# Patient Record
Sex: Female | Born: 1935 | Race: White | Hispanic: No | Marital: Married | Smoking: Former smoker
Health system: Southern US, Community
[De-identification: ages and names within clinical notes are randomized; demographics above are authoritative.]

## PROBLEM LIST (undated history)

## (undated) DIAGNOSIS — E785 Hyperlipidemia, unspecified: Secondary | ICD-10-CM

## (undated) DIAGNOSIS — J449 Chronic obstructive pulmonary disease, unspecified: Secondary | ICD-10-CM

## (undated) DIAGNOSIS — T82190A Other mechanical complication of cardiac electrode, initial encounter: Secondary | ICD-10-CM

## (undated) DIAGNOSIS — M1990 Unspecified osteoarthritis, unspecified site: Secondary | ICD-10-CM

## (undated) DIAGNOSIS — K469 Unspecified abdominal hernia without obstruction or gangrene: Secondary | ICD-10-CM

## (undated) DIAGNOSIS — J45909 Unspecified asthma, uncomplicated: Secondary | ICD-10-CM

## (undated) DIAGNOSIS — I10 Essential (primary) hypertension: Secondary | ICD-10-CM

## (undated) DIAGNOSIS — E039 Hypothyroidism, unspecified: Secondary | ICD-10-CM

## (undated) DIAGNOSIS — Z45018 Encounter for adjustment and management of other part of cardiac pacemaker: Secondary | ICD-10-CM

## (undated) DIAGNOSIS — I428 Other cardiomyopathies: Secondary | ICD-10-CM

## (undated) DIAGNOSIS — I509 Heart failure, unspecified: Secondary | ICD-10-CM

## (undated) HISTORY — DX: Other cardiomyopathies: I42.8

## (undated) HISTORY — DX: Unspecified abdominal hernia without obstruction or gangrene: K46.9

## (undated) HISTORY — PX: APPENDECTOMY: SHX54

## (undated) HISTORY — DX: Hyperlipidemia, unspecified: E78.5

## (undated) HISTORY — PX: CARDIAC DEFIBRILLATOR PLACEMENT: SHX171

## (undated) HISTORY — DX: Heart failure, unspecified: I50.9

## (undated) HISTORY — DX: Unspecified osteoarthritis, unspecified site: M19.90

## (undated) HISTORY — DX: Other mechanical complication of cardiac electrode, initial encounter: T82.190A

## (undated) HISTORY — DX: Chronic obstructive pulmonary disease, unspecified: J44.9

## (undated) HISTORY — DX: Unspecified asthma, uncomplicated: J45.909

## (undated) HISTORY — PX: POLYPECTOMY: SHX149

## (undated) HISTORY — PX: OTHER SURGICAL HISTORY: SHX169

## (undated) HISTORY — DX: Encounter for adjustment and management of other part of cardiac pacemaker: Z45.018

## (undated) HISTORY — PX: CYSTECTOMY: SUR359

## (undated) HISTORY — DX: Essential (primary) hypertension: I10

## (undated) HISTORY — PX: POSTERIOR FUSION CLIVUS-C2 TRANSARTICULAR STEALTH GUIDED W/ ICBG: SUR622

## (undated) HISTORY — DX: Hypothyroidism, unspecified: E03.9

---

## 1998-03-27 ENCOUNTER — Ambulatory Visit (HOSPITAL_COMMUNITY): Admission: RE | Admit: 1998-03-27 | Discharge: 1998-03-27 | Payer: Self-pay | Admitting: Internal Medicine

## 1998-04-10 ENCOUNTER — Ambulatory Visit (HOSPITAL_COMMUNITY): Admission: RE | Admit: 1998-04-10 | Discharge: 1998-04-10 | Payer: Self-pay | Admitting: Internal Medicine

## 1998-04-10 ENCOUNTER — Encounter: Payer: Self-pay | Admitting: Internal Medicine

## 1998-08-04 ENCOUNTER — Ambulatory Visit (HOSPITAL_COMMUNITY): Admission: RE | Admit: 1998-08-04 | Discharge: 1998-08-04 | Payer: Self-pay | Admitting: Urology

## 1999-05-03 ENCOUNTER — Encounter: Payer: Self-pay | Admitting: Internal Medicine

## 1999-05-03 ENCOUNTER — Ambulatory Visit (HOSPITAL_COMMUNITY): Admission: RE | Admit: 1999-05-03 | Discharge: 1999-05-03 | Payer: Self-pay | Admitting: Internal Medicine

## 2000-05-19 ENCOUNTER — Encounter: Payer: Self-pay | Admitting: Internal Medicine

## 2000-05-19 ENCOUNTER — Ambulatory Visit (HOSPITAL_COMMUNITY): Admission: RE | Admit: 2000-05-19 | Discharge: 2000-05-19 | Payer: Self-pay | Admitting: Internal Medicine

## 2000-06-04 ENCOUNTER — Encounter: Admission: RE | Admit: 2000-06-04 | Discharge: 2000-06-04 | Payer: Self-pay | Admitting: Surgery

## 2000-06-04 ENCOUNTER — Encounter: Payer: Self-pay | Admitting: Surgery

## 2000-07-01 ENCOUNTER — Encounter (INDEPENDENT_AMBULATORY_CARE_PROVIDER_SITE_OTHER): Payer: Self-pay

## 2000-07-01 ENCOUNTER — Observation Stay (HOSPITAL_COMMUNITY): Admission: RE | Admit: 2000-07-01 | Discharge: 2000-07-02 | Payer: Self-pay | Admitting: Surgery

## 2001-05-28 ENCOUNTER — Ambulatory Visit (HOSPITAL_COMMUNITY): Admission: RE | Admit: 2001-05-28 | Discharge: 2001-05-28 | Payer: Self-pay | Admitting: Internal Medicine

## 2001-05-28 ENCOUNTER — Encounter: Payer: Self-pay | Admitting: Internal Medicine

## 2002-05-31 ENCOUNTER — Encounter: Payer: Self-pay | Admitting: Internal Medicine

## 2002-05-31 ENCOUNTER — Ambulatory Visit (HOSPITAL_COMMUNITY): Admission: RE | Admit: 2002-05-31 | Discharge: 2002-05-31 | Payer: Self-pay | Admitting: Internal Medicine

## 2003-01-31 ENCOUNTER — Encounter: Payer: Self-pay | Admitting: Internal Medicine

## 2003-01-31 ENCOUNTER — Ambulatory Visit (HOSPITAL_COMMUNITY): Admission: RE | Admit: 2003-01-31 | Discharge: 2003-01-31 | Payer: Self-pay | Admitting: Internal Medicine

## 2003-02-16 ENCOUNTER — Ambulatory Visit (HOSPITAL_COMMUNITY): Admission: RE | Admit: 2003-02-16 | Discharge: 2003-02-16 | Payer: Self-pay | Admitting: Specialist

## 2003-03-04 ENCOUNTER — Emergency Department (HOSPITAL_COMMUNITY): Admission: EM | Admit: 2003-03-04 | Discharge: 2003-03-04 | Payer: Self-pay | Admitting: Emergency Medicine

## 2003-04-26 ENCOUNTER — Encounter: Payer: Self-pay | Admitting: Gastroenterology

## 2003-04-26 ENCOUNTER — Encounter: Admission: RE | Admit: 2003-04-26 | Discharge: 2003-04-26 | Payer: Self-pay | Admitting: Gastroenterology

## 2003-09-26 ENCOUNTER — Inpatient Hospital Stay (HOSPITAL_COMMUNITY): Admission: EM | Admit: 2003-09-26 | Discharge: 2003-09-30 | Payer: Self-pay | Admitting: *Deleted

## 2004-03-21 ENCOUNTER — Inpatient Hospital Stay (HOSPITAL_BASED_OUTPATIENT_CLINIC_OR_DEPARTMENT_OTHER): Admission: RE | Admit: 2004-03-21 | Discharge: 2004-03-21 | Payer: Self-pay | Admitting: Cardiology

## 2004-06-21 ENCOUNTER — Ambulatory Visit: Payer: Self-pay | Admitting: Cardiology

## 2004-06-27 ENCOUNTER — Ambulatory Visit: Payer: Self-pay | Admitting: Pulmonary Disease

## 2004-07-20 ENCOUNTER — Ambulatory Visit: Payer: Self-pay | Admitting: Cardiology

## 2004-08-15 ENCOUNTER — Ambulatory Visit (HOSPITAL_COMMUNITY): Admission: RE | Admit: 2004-08-15 | Discharge: 2004-08-15 | Payer: Self-pay | Admitting: General Surgery

## 2004-08-15 ENCOUNTER — Ambulatory Visit (HOSPITAL_BASED_OUTPATIENT_CLINIC_OR_DEPARTMENT_OTHER): Admission: RE | Admit: 2004-08-15 | Discharge: 2004-08-15 | Payer: Self-pay | Admitting: General Surgery

## 2004-08-15 ENCOUNTER — Encounter (INDEPENDENT_AMBULATORY_CARE_PROVIDER_SITE_OTHER): Payer: Self-pay | Admitting: Specialist

## 2004-09-07 ENCOUNTER — Ambulatory Visit: Payer: Self-pay | Admitting: Internal Medicine

## 2004-09-18 ENCOUNTER — Ambulatory Visit (HOSPITAL_COMMUNITY): Admission: RE | Admit: 2004-09-18 | Discharge: 2004-09-18 | Payer: Self-pay | Admitting: Internal Medicine

## 2004-09-21 ENCOUNTER — Ambulatory Visit: Payer: Self-pay | Admitting: Internal Medicine

## 2004-09-26 ENCOUNTER — Ambulatory Visit: Payer: Self-pay | Admitting: Internal Medicine

## 2004-09-27 ENCOUNTER — Ambulatory Visit: Payer: Self-pay | Admitting: Pulmonary Disease

## 2004-10-08 ENCOUNTER — Ambulatory Visit (HOSPITAL_COMMUNITY): Admission: RE | Admit: 2004-10-08 | Discharge: 2004-10-08 | Payer: Self-pay | Admitting: Internal Medicine

## 2004-10-12 ENCOUNTER — Ambulatory Visit: Payer: Self-pay

## 2004-10-18 ENCOUNTER — Ambulatory Visit: Payer: Self-pay | Admitting: Internal Medicine

## 2004-11-08 ENCOUNTER — Ambulatory Visit: Payer: Self-pay | Admitting: Internal Medicine

## 2004-11-12 ENCOUNTER — Ambulatory Visit: Payer: Self-pay | Admitting: Internal Medicine

## 2004-11-16 ENCOUNTER — Inpatient Hospital Stay (HOSPITAL_COMMUNITY): Admission: RE | Admit: 2004-11-16 | Discharge: 2004-11-17 | Payer: Self-pay | Admitting: Internal Medicine

## 2004-11-16 ENCOUNTER — Ambulatory Visit: Payer: Self-pay | Admitting: Internal Medicine

## 2004-11-23 ENCOUNTER — Ambulatory Visit: Payer: Self-pay | Admitting: Internal Medicine

## 2004-11-26 ENCOUNTER — Ambulatory Visit: Payer: Self-pay | Admitting: Internal Medicine

## 2004-12-03 ENCOUNTER — Ambulatory Visit: Payer: Self-pay | Admitting: Internal Medicine

## 2004-12-26 ENCOUNTER — Ambulatory Visit: Payer: Self-pay | Admitting: Pulmonary Disease

## 2005-01-09 ENCOUNTER — Ambulatory Visit: Payer: Self-pay | Admitting: Internal Medicine

## 2005-02-15 ENCOUNTER — Ambulatory Visit: Payer: Self-pay | Admitting: Internal Medicine

## 2005-02-18 ENCOUNTER — Ambulatory Visit: Payer: Self-pay | Admitting: Pulmonary Disease

## 2005-02-26 ENCOUNTER — Ambulatory Visit: Payer: Self-pay

## 2005-02-26 ENCOUNTER — Ambulatory Visit: Payer: Self-pay | Admitting: Cardiology

## 2005-03-28 ENCOUNTER — Ambulatory Visit: Payer: Self-pay | Admitting: Internal Medicine

## 2005-04-01 ENCOUNTER — Ambulatory Visit: Payer: Self-pay | Admitting: Internal Medicine

## 2005-04-02 ENCOUNTER — Encounter (INDEPENDENT_AMBULATORY_CARE_PROVIDER_SITE_OTHER): Payer: Self-pay | Admitting: *Deleted

## 2005-04-02 ENCOUNTER — Inpatient Hospital Stay (HOSPITAL_COMMUNITY): Admission: RE | Admit: 2005-04-02 | Discharge: 2005-04-03 | Payer: Self-pay | Admitting: Urology

## 2005-04-08 ENCOUNTER — Ambulatory Visit: Payer: Self-pay | Admitting: Pulmonary Disease

## 2005-05-06 ENCOUNTER — Ambulatory Visit: Payer: Self-pay | Admitting: Internal Medicine

## 2005-05-15 ENCOUNTER — Ambulatory Visit: Payer: Self-pay | Admitting: Internal Medicine

## 2005-05-15 ENCOUNTER — Ambulatory Visit (HOSPITAL_COMMUNITY): Admission: RE | Admit: 2005-05-15 | Discharge: 2005-05-15 | Payer: Self-pay | Admitting: Internal Medicine

## 2005-05-29 ENCOUNTER — Ambulatory Visit: Payer: Self-pay

## 2005-08-06 ENCOUNTER — Ambulatory Visit: Payer: Self-pay | Admitting: Internal Medicine

## 2005-10-01 ENCOUNTER — Ambulatory Visit: Payer: Self-pay | Admitting: Internal Medicine

## 2005-10-11 ENCOUNTER — Ambulatory Visit: Payer: Self-pay | Admitting: Internal Medicine

## 2005-10-18 ENCOUNTER — Ambulatory Visit: Payer: Self-pay | Admitting: Internal Medicine

## 2005-10-30 ENCOUNTER — Ambulatory Visit: Payer: Self-pay | Admitting: Cardiology

## 2005-10-30 ENCOUNTER — Ambulatory Visit: Payer: Self-pay | Admitting: Internal Medicine

## 2005-11-05 ENCOUNTER — Ambulatory Visit (HOSPITAL_COMMUNITY): Admission: RE | Admit: 2005-11-05 | Discharge: 2005-11-05 | Payer: Self-pay | Admitting: Internal Medicine

## 2005-12-05 ENCOUNTER — Ambulatory Visit: Payer: Self-pay | Admitting: Internal Medicine

## 2005-12-11 ENCOUNTER — Ambulatory Visit: Payer: Self-pay | Admitting: Internal Medicine

## 2005-12-31 ENCOUNTER — Ambulatory Visit: Payer: Self-pay | Admitting: Internal Medicine

## 2006-03-24 ENCOUNTER — Ambulatory Visit: Payer: Self-pay | Admitting: Internal Medicine

## 2006-04-16 ENCOUNTER — Ambulatory Visit: Payer: Self-pay | Admitting: Internal Medicine

## 2006-04-25 ENCOUNTER — Encounter: Payer: Self-pay | Admitting: Cardiology

## 2006-04-25 ENCOUNTER — Ambulatory Visit: Payer: Self-pay

## 2006-06-20 ENCOUNTER — Ambulatory Visit: Payer: Self-pay

## 2006-06-24 ENCOUNTER — Ambulatory Visit: Payer: Self-pay | Admitting: *Deleted

## 2006-06-24 ENCOUNTER — Ambulatory Visit: Payer: Self-pay | Admitting: Internal Medicine

## 2006-07-01 ENCOUNTER — Ambulatory Visit: Payer: Self-pay

## 2006-07-01 ENCOUNTER — Encounter: Payer: Self-pay | Admitting: Internal Medicine

## 2006-08-05 ENCOUNTER — Ambulatory Visit: Payer: Self-pay | Admitting: Internal Medicine

## 2006-09-23 ENCOUNTER — Ambulatory Visit: Payer: Self-pay | Admitting: Internal Medicine

## 2006-10-28 ENCOUNTER — Ambulatory Visit: Payer: Self-pay | Admitting: Cardiology

## 2006-11-24 ENCOUNTER — Ambulatory Visit (HOSPITAL_COMMUNITY): Admission: RE | Admit: 2006-11-24 | Discharge: 2006-11-24 | Payer: Self-pay | Admitting: Internal Medicine

## 2006-12-17 ENCOUNTER — Ambulatory Visit: Payer: Self-pay | Admitting: Internal Medicine

## 2006-12-23 ENCOUNTER — Ambulatory Visit: Payer: Self-pay | Admitting: Internal Medicine

## 2007-01-23 ENCOUNTER — Ambulatory Visit: Payer: Self-pay | Admitting: Cardiology

## 2007-01-23 LAB — CONVERTED CEMR LAB
BUN: 13 mg/dL (ref 6–23)
CO2: 26 meq/L (ref 19–32)
Calcium: 9.6 mg/dL (ref 8.4–10.5)
Chloride: 103 meq/L (ref 96–112)
Creatinine, Ser: 1.4 mg/dL — ABNORMAL HIGH (ref 0.4–1.2)
Pro B Natriuretic peptide (BNP): 71 pg/mL (ref 0.0–100.0)
TSH: 2.11 microintl units/mL (ref 0.35–5.50)

## 2007-01-27 ENCOUNTER — Ambulatory Visit: Admission: RE | Admit: 2007-01-27 | Discharge: 2007-01-27 | Payer: Self-pay | Admitting: Cardiology

## 2007-02-10 ENCOUNTER — Ambulatory Visit: Payer: Self-pay | Admitting: Internal Medicine

## 2007-02-17 ENCOUNTER — Ambulatory Visit: Payer: Self-pay | Admitting: Cardiology

## 2007-03-18 ENCOUNTER — Ambulatory Visit: Payer: Self-pay | Admitting: Emergency Medicine

## 2007-04-13 ENCOUNTER — Ambulatory Visit (HOSPITAL_BASED_OUTPATIENT_CLINIC_OR_DEPARTMENT_OTHER): Admission: RE | Admit: 2007-04-13 | Discharge: 2007-04-13 | Payer: Self-pay | Admitting: Emergency Medicine

## 2007-04-17 ENCOUNTER — Ambulatory Visit: Payer: Self-pay | Admitting: Internal Medicine

## 2007-04-27 ENCOUNTER — Ambulatory Visit: Payer: Self-pay | Admitting: Emergency Medicine

## 2007-04-29 ENCOUNTER — Ambulatory Visit: Payer: Self-pay | Admitting: Pulmonary Disease

## 2007-05-05 ENCOUNTER — Ambulatory Visit: Payer: Self-pay | Admitting: Internal Medicine

## 2007-05-13 ENCOUNTER — Ambulatory Visit: Payer: Self-pay

## 2007-05-22 ENCOUNTER — Ambulatory Visit (HOSPITAL_BASED_OUTPATIENT_CLINIC_OR_DEPARTMENT_OTHER): Admission: RE | Admit: 2007-05-22 | Discharge: 2007-05-22 | Payer: Self-pay | Admitting: Emergency Medicine

## 2007-05-27 ENCOUNTER — Ambulatory Visit: Payer: Self-pay | Admitting: Pulmonary Disease

## 2007-05-27 ENCOUNTER — Ambulatory Visit: Payer: Self-pay | Admitting: Emergency Medicine

## 2007-06-03 DIAGNOSIS — J449 Chronic obstructive pulmonary disease, unspecified: Secondary | ICD-10-CM

## 2007-06-03 DIAGNOSIS — J4489 Other specified chronic obstructive pulmonary disease: Secondary | ICD-10-CM

## 2007-06-03 DIAGNOSIS — I2789 Other specified pulmonary heart diseases: Secondary | ICD-10-CM

## 2007-06-03 DIAGNOSIS — I509 Heart failure, unspecified: Secondary | ICD-10-CM

## 2007-06-03 DIAGNOSIS — I10 Essential (primary) hypertension: Secondary | ICD-10-CM

## 2007-06-03 DIAGNOSIS — E039 Hypothyroidism, unspecified: Secondary | ICD-10-CM | POA: Insufficient documentation

## 2007-06-03 DIAGNOSIS — I5022 Chronic systolic (congestive) heart failure: Secondary | ICD-10-CM

## 2007-06-03 HISTORY — DX: Chronic obstructive pulmonary disease, unspecified: J44.9

## 2007-06-03 HISTORY — DX: Other specified chronic obstructive pulmonary disease: J44.89

## 2007-06-03 HISTORY — DX: Heart failure, unspecified: I50.9

## 2007-06-03 HISTORY — DX: Hypothyroidism, unspecified: E03.9

## 2007-06-03 HISTORY — DX: Essential (primary) hypertension: I10

## 2007-06-23 ENCOUNTER — Ambulatory Visit: Payer: Self-pay | Admitting: Internal Medicine

## 2007-06-29 ENCOUNTER — Ambulatory Visit: Payer: Self-pay | Admitting: Internal Medicine

## 2007-06-29 LAB — CONVERTED CEMR LAB
BUN: 13 mg/dL (ref 6–23)
Calcium: 9.7 mg/dL (ref 8.4–10.5)
Chloride: 103 meq/L (ref 96–112)
Creatinine, Ser: 1.3 mg/dL — ABNORMAL HIGH (ref 0.4–1.2)
GFR calc non Af Amer: 42 mL/min
Sodium: 141 meq/L (ref 135–145)

## 2007-07-15 ENCOUNTER — Ambulatory Visit: Payer: Self-pay | Admitting: Emergency Medicine

## 2007-08-12 ENCOUNTER — Ambulatory Visit: Payer: Self-pay | Admitting: Internal Medicine

## 2007-09-14 ENCOUNTER — Ambulatory Visit: Payer: Self-pay | Admitting: Emergency Medicine

## 2007-10-26 ENCOUNTER — Ambulatory Visit: Payer: Self-pay | Admitting: Emergency Medicine

## 2007-11-10 ENCOUNTER — Ambulatory Visit: Payer: Self-pay | Admitting: Internal Medicine

## 2007-11-20 ENCOUNTER — Ambulatory Visit: Payer: Self-pay | Admitting: *Deleted

## 2007-12-11 ENCOUNTER — Encounter: Payer: Self-pay | Admitting: Emergency Medicine

## 2008-01-19 ENCOUNTER — Ambulatory Visit: Payer: Self-pay | Admitting: Internal Medicine

## 2008-01-25 ENCOUNTER — Telehealth: Payer: Self-pay | Admitting: Emergency Medicine

## 2008-03-03 ENCOUNTER — Ambulatory Visit (HOSPITAL_COMMUNITY): Admission: RE | Admit: 2008-03-03 | Discharge: 2008-03-03 | Payer: Self-pay | Admitting: Internal Medicine

## 2008-04-06 ENCOUNTER — Ambulatory Visit: Payer: Self-pay | Admitting: Emergency Medicine

## 2008-04-20 ENCOUNTER — Ambulatory Visit: Payer: Self-pay | Admitting: Internal Medicine

## 2008-04-24 ENCOUNTER — Ambulatory Visit: Payer: Self-pay | Admitting: Cardiology

## 2008-04-24 ENCOUNTER — Inpatient Hospital Stay (HOSPITAL_COMMUNITY): Admission: EM | Admit: 2008-04-24 | Discharge: 2008-05-04 | Payer: Self-pay | Admitting: Emergency Medicine

## 2008-04-28 ENCOUNTER — Encounter (INDEPENDENT_AMBULATORY_CARE_PROVIDER_SITE_OTHER): Payer: Self-pay | Admitting: Gastroenterology

## 2008-07-20 ENCOUNTER — Ambulatory Visit: Payer: Self-pay | Admitting: Internal Medicine

## 2008-09-21 ENCOUNTER — Encounter: Payer: Self-pay | Admitting: Internal Medicine

## 2008-10-18 ENCOUNTER — Ambulatory Visit: Payer: Self-pay | Admitting: Internal Medicine

## 2008-12-05 ENCOUNTER — Telehealth (INDEPENDENT_AMBULATORY_CARE_PROVIDER_SITE_OTHER): Payer: Self-pay | Admitting: *Deleted

## 2008-12-28 ENCOUNTER — Telehealth (INDEPENDENT_AMBULATORY_CARE_PROVIDER_SITE_OTHER): Payer: Self-pay | Admitting: *Deleted

## 2009-01-11 ENCOUNTER — Ambulatory Visit: Payer: Self-pay | Admitting: Emergency Medicine

## 2009-01-24 ENCOUNTER — Ambulatory Visit: Payer: Self-pay | Admitting: Internal Medicine

## 2009-02-02 ENCOUNTER — Ambulatory Visit: Payer: Self-pay

## 2009-02-02 ENCOUNTER — Encounter: Payer: Self-pay | Admitting: Internal Medicine

## 2009-02-10 ENCOUNTER — Encounter: Payer: Self-pay | Admitting: Internal Medicine

## 2009-02-14 ENCOUNTER — Ambulatory Visit: Payer: Self-pay | Admitting: Emergency Medicine

## 2009-02-22 ENCOUNTER — Telehealth (INDEPENDENT_AMBULATORY_CARE_PROVIDER_SITE_OTHER): Payer: Self-pay | Admitting: *Deleted

## 2009-03-01 ENCOUNTER — Encounter: Admission: RE | Admit: 2009-03-01 | Discharge: 2009-03-01 | Payer: Self-pay | Admitting: Orthopaedic Surgery

## 2009-04-25 ENCOUNTER — Ambulatory Visit: Payer: Self-pay | Admitting: Internal Medicine

## 2009-05-05 ENCOUNTER — Telehealth (INDEPENDENT_AMBULATORY_CARE_PROVIDER_SITE_OTHER): Payer: Self-pay | Admitting: *Deleted

## 2009-05-11 ENCOUNTER — Encounter: Payer: Self-pay | Admitting: Internal Medicine

## 2009-05-24 ENCOUNTER — Encounter (INDEPENDENT_AMBULATORY_CARE_PROVIDER_SITE_OTHER): Payer: Self-pay | Admitting: *Deleted

## 2009-06-12 ENCOUNTER — Ambulatory Visit: Payer: Self-pay | Admitting: Emergency Medicine

## 2009-07-25 ENCOUNTER — Encounter: Payer: Self-pay | Admitting: Internal Medicine

## 2009-07-31 ENCOUNTER — Telehealth: Payer: Self-pay | Admitting: Emergency Medicine

## 2009-07-31 ENCOUNTER — Ambulatory Visit: Payer: Self-pay | Admitting: Internal Medicine

## 2009-08-03 ENCOUNTER — Encounter: Payer: Self-pay | Admitting: Internal Medicine

## 2009-08-09 ENCOUNTER — Encounter: Payer: Self-pay | Admitting: Emergency Medicine

## 2009-08-09 ENCOUNTER — Ambulatory Visit (HOSPITAL_COMMUNITY): Admission: RE | Admit: 2009-08-09 | Discharge: 2009-08-09 | Payer: Self-pay | Admitting: Emergency Medicine

## 2009-09-05 ENCOUNTER — Ambulatory Visit: Payer: Self-pay | Admitting: Cardiology

## 2009-09-05 ENCOUNTER — Inpatient Hospital Stay (HOSPITAL_COMMUNITY): Admission: EM | Admit: 2009-09-05 | Discharge: 2009-09-15 | Payer: Self-pay | Admitting: Emergency Medicine

## 2009-09-06 ENCOUNTER — Encounter (INDEPENDENT_AMBULATORY_CARE_PROVIDER_SITE_OTHER): Payer: Self-pay | Admitting: Internal Medicine

## 2009-10-05 ENCOUNTER — Encounter: Payer: Self-pay | Admitting: Cardiology

## 2009-10-24 ENCOUNTER — Ambulatory Visit: Payer: Self-pay | Admitting: Internal Medicine

## 2009-10-31 ENCOUNTER — Encounter: Payer: Self-pay | Admitting: Internal Medicine

## 2009-11-08 ENCOUNTER — Ambulatory Visit: Payer: Self-pay | Admitting: Emergency Medicine

## 2009-11-17 ENCOUNTER — Encounter: Payer: Self-pay | Admitting: Internal Medicine

## 2009-11-29 ENCOUNTER — Encounter: Admission: RE | Admit: 2009-11-29 | Discharge: 2009-11-29 | Payer: Self-pay | Admitting: General Surgery

## 2009-12-04 ENCOUNTER — Ambulatory Visit (HOSPITAL_COMMUNITY): Admission: RE | Admit: 2009-12-04 | Discharge: 2009-12-04 | Payer: Self-pay | Admitting: Internal Medicine

## 2009-12-06 ENCOUNTER — Encounter: Admission: RE | Admit: 2009-12-06 | Discharge: 2009-12-06 | Payer: Self-pay | Admitting: Internal Medicine

## 2009-12-11 ENCOUNTER — Ambulatory Visit: Payer: Self-pay | Admitting: Internal Medicine

## 2009-12-11 ENCOUNTER — Encounter: Payer: Self-pay | Admitting: Internal Medicine

## 2009-12-11 DIAGNOSIS — I429 Cardiomyopathy, unspecified: Secondary | ICD-10-CM | POA: Insufficient documentation

## 2009-12-13 ENCOUNTER — Encounter: Payer: Self-pay | Admitting: Cardiology

## 2009-12-13 ENCOUNTER — Encounter: Payer: Self-pay | Admitting: Internal Medicine

## 2009-12-15 ENCOUNTER — Telehealth: Payer: Self-pay | Admitting: Internal Medicine

## 2010-01-10 ENCOUNTER — Encounter (INDEPENDENT_AMBULATORY_CARE_PROVIDER_SITE_OTHER): Payer: Self-pay | Admitting: General Surgery

## 2010-01-10 ENCOUNTER — Inpatient Hospital Stay (HOSPITAL_COMMUNITY): Admission: RE | Admit: 2010-01-10 | Discharge: 2010-01-25 | Payer: Self-pay | Admitting: General Surgery

## 2010-01-10 ENCOUNTER — Ambulatory Visit: Payer: Self-pay | Admitting: Internal Medicine

## 2010-01-19 ENCOUNTER — Ambulatory Visit: Payer: Self-pay | Admitting: Physical Medicine & Rehabilitation

## 2010-02-17 ENCOUNTER — Inpatient Hospital Stay (HOSPITAL_COMMUNITY): Admission: EM | Admit: 2010-02-17 | Discharge: 2010-02-22 | Payer: Self-pay | Admitting: Emergency Medicine

## 2010-03-01 ENCOUNTER — Telehealth: Payer: Self-pay | Admitting: Internal Medicine

## 2010-03-08 ENCOUNTER — Ambulatory Visit: Payer: Self-pay | Admitting: Emergency Medicine

## 2010-03-14 ENCOUNTER — Ambulatory Visit: Payer: Self-pay | Admitting: Internal Medicine

## 2010-03-14 ENCOUNTER — Encounter: Payer: Self-pay | Admitting: Internal Medicine

## 2010-03-15 ENCOUNTER — Ambulatory Visit: Payer: Self-pay | Admitting: Internal Medicine

## 2010-03-19 ENCOUNTER — Telehealth: Payer: Self-pay | Admitting: Internal Medicine

## 2010-03-19 LAB — CONVERTED CEMR LAB
CO2: 24 meq/L (ref 19–32)
Calcium: 8.8 mg/dL (ref 8.4–10.5)
Glucose, Bld: 256 mg/dL — ABNORMAL HIGH (ref 70–99)
Potassium: 3.7 meq/L (ref 3.5–5.1)
Sodium: 136 meq/L (ref 135–145)

## 2010-04-05 ENCOUNTER — Encounter: Payer: Self-pay | Admitting: Internal Medicine

## 2010-04-12 ENCOUNTER — Ambulatory Visit: Payer: Self-pay | Admitting: Internal Medicine

## 2010-04-26 ENCOUNTER — Encounter: Payer: Self-pay | Admitting: Internal Medicine

## 2010-05-02 ENCOUNTER — Encounter: Payer: Self-pay | Admitting: Internal Medicine

## 2010-05-14 ENCOUNTER — Ambulatory Visit: Payer: Self-pay | Admitting: Emergency Medicine

## 2010-05-17 ENCOUNTER — Ambulatory Visit: Payer: Self-pay | Admitting: Internal Medicine

## 2010-06-14 ENCOUNTER — Ambulatory Visit: Payer: Self-pay | Admitting: Internal Medicine

## 2010-07-04 ENCOUNTER — Encounter: Payer: Self-pay | Admitting: Internal Medicine

## 2010-07-16 ENCOUNTER — Encounter: Payer: Self-pay | Admitting: Internal Medicine

## 2010-07-18 ENCOUNTER — Ambulatory Visit: Payer: Self-pay | Admitting: Internal Medicine

## 2010-07-20 ENCOUNTER — Telehealth: Payer: Self-pay | Admitting: Internal Medicine

## 2010-07-23 ENCOUNTER — Telehealth: Payer: Self-pay | Admitting: Internal Medicine

## 2010-07-30 ENCOUNTER — Ambulatory Visit (HOSPITAL_COMMUNITY)
Admission: RE | Admit: 2010-07-30 | Discharge: 2010-07-30 | Payer: Self-pay | Source: Home / Self Care | Attending: Internal Medicine | Admitting: Internal Medicine

## 2010-07-30 ENCOUNTER — Encounter: Payer: Self-pay | Admitting: Internal Medicine

## 2010-07-31 ENCOUNTER — Encounter: Payer: Self-pay | Admitting: Internal Medicine

## 2010-07-31 ENCOUNTER — Ambulatory Visit: Payer: Self-pay | Admitting: Internal Medicine

## 2010-08-09 ENCOUNTER — Ambulatory Visit: Payer: Self-pay

## 2010-08-09 ENCOUNTER — Ambulatory Visit (HOSPITAL_COMMUNITY)
Admission: RE | Admit: 2010-08-09 | Discharge: 2010-08-09 | Payer: Self-pay | Source: Home / Self Care | Attending: Internal Medicine | Admitting: Internal Medicine

## 2010-08-09 ENCOUNTER — Encounter: Payer: Self-pay | Admitting: Internal Medicine

## 2010-08-21 ENCOUNTER — Encounter: Payer: Self-pay | Admitting: Internal Medicine

## 2010-08-27 ENCOUNTER — Encounter: Payer: Self-pay | Admitting: Internal Medicine

## 2010-08-28 ENCOUNTER — Encounter: Payer: Self-pay | Admitting: Emergency Medicine

## 2010-08-28 ENCOUNTER — Encounter: Payer: Self-pay | Admitting: Internal Medicine

## 2010-08-28 ENCOUNTER — Emergency Department (HOSPITAL_COMMUNITY)
Admission: EM | Admit: 2010-08-28 | Discharge: 2010-08-28 | Payer: Self-pay | Source: Home / Self Care | Admitting: Emergency Medicine

## 2010-08-28 ENCOUNTER — Telehealth: Payer: Self-pay | Admitting: Internal Medicine

## 2010-09-03 ENCOUNTER — Encounter: Payer: Self-pay | Admitting: Internal Medicine

## 2010-09-03 ENCOUNTER — Ambulatory Visit (HOSPITAL_COMMUNITY)
Admission: RE | Admit: 2010-09-03 | Discharge: 2010-09-03 | Payer: Self-pay | Source: Home / Self Care | Attending: Internal Medicine | Admitting: Internal Medicine

## 2010-09-03 LAB — DIFFERENTIAL
Basophils Absolute: 0 10*3/uL (ref 0.0–0.1)
Basophils Relative: 0 % (ref 0–1)
Eosinophils Absolute: 0.2 10*3/uL (ref 0.0–0.7)
Eosinophils Relative: 2 % (ref 0–5)
Lymphocytes Relative: 26 % (ref 12–46)
Lymphs Abs: 2.5 10*3/uL (ref 0.7–4.0)
Monocytes Absolute: 1.1 10*3/uL — ABNORMAL HIGH (ref 0.1–1.0)
Monocytes Relative: 11 % (ref 3–12)
Neutro Abs: 5.8 10*3/uL (ref 1.7–7.7)
Neutrophils Relative %: 60 % (ref 43–77)

## 2010-09-03 LAB — CBC
HCT: 36.9 % (ref 36.0–46.0)
Hemoglobin: 12 g/dL (ref 12.0–15.0)
MCH: 28.5 pg (ref 26.0–34.0)
MCHC: 32.5 g/dL (ref 30.0–36.0)
MCV: 87.6 fL (ref 78.0–100.0)
Platelets: 270 10*3/uL (ref 150–400)
RBC: 4.21 MIL/uL (ref 3.87–5.11)
RDW: 14.4 % (ref 11.5–15.5)
WBC: 9.6 10*3/uL (ref 4.0–10.5)

## 2010-09-03 LAB — POCT CARDIAC MARKERS
CKMB, poc: 3.4 ng/mL (ref 1.0–8.0)
CKMB, poc: 3.6 ng/mL (ref 1.0–8.0)
Myoglobin, poc: 149 ng/mL (ref 12–200)
Myoglobin, poc: 128 ng/mL (ref 12–200)
Troponin i, poc: <0.05 ng/mL (ref 0.00–0.09)
Troponin i, poc: <0.05 ng/mL (ref 0.00–0.09)

## 2010-09-03 LAB — COMPREHENSIVE METABOLIC PANEL
ALT: 12 U/L (ref 0–35)
AST: 14 U/L (ref 0–37)
Albumin: 3.7 g/dL (ref 3.5–5.2)
Alkaline Phosphatase: 63 U/L (ref 39–117)
BUN: 14 mg/dL (ref 6–23)
CO2: 26 mEq/L (ref 19–32)
Calcium: 9.8 mg/dL (ref 8.4–10.5)
Chloride: 97 mEq/L (ref 96–112)
Creatinine, Ser: 1.31 mg/dL — ABNORMAL HIGH (ref 0.4–1.2)
GFR calc Af Amer: 47 mL/min — ABNORMAL LOW (ref >60)
GFR calc non Af Amer: 39 mL/min — ABNORMAL LOW (ref >60)
Glucose, Bld: 100 mg/dL — ABNORMAL HIGH (ref 70–99)
Potassium: 3.8 mEq/L (ref 3.5–5.1)
Sodium: 132 mEq/L — ABNORMAL LOW (ref 135–145)
Total Bilirubin: 0.4 mg/dL (ref 0.3–1.2)
Total Protein: 7 g/dL (ref 6.0–8.3)

## 2010-09-03 LAB — PROTIME-INR
INR: 1.02 (ref 0.00–1.49)
Prothrombin Time: 13.6 seconds (ref 11.6–15.2)

## 2010-09-03 LAB — URINE CULTURE
Colony Count: NO GROWTH
Culture  Setup Time: 201201102231
Culture: NO GROWTH

## 2010-09-03 LAB — APTT: aPTT: 48 seconds — ABNORMAL HIGH (ref 24–37)

## 2010-09-03 LAB — URINALYSIS, ROUTINE W REFLEX MICROSCOPIC
Bilirubin Urine: NEGATIVE
Hgb urine dipstick: NEGATIVE
Ketones, ur: NEGATIVE mg/dL
Nitrite: NEGATIVE
Protein, ur: NEGATIVE mg/dL
Specific Gravity, Urine: 1.016 (ref 1.005–1.030)
Urine Glucose, Fasting: NEGATIVE mg/dL
Urobilinogen, UA: 0.2 mg/dL (ref 0.0–1.0)
pH: 5.5 (ref 5.0–8.0)

## 2010-09-05 ENCOUNTER — Encounter: Payer: Self-pay | Admitting: Internal Medicine

## 2010-09-05 LAB — GLUCOSE, CAPILLARY
Glucose-Capillary: 119 mg/dL — ABNORMAL HIGH (ref 70–99)
Glucose-Capillary: 92 mg/dL (ref 70–99)

## 2010-09-05 LAB — SURGICAL PCR SCREEN
MRSA, PCR: NEGATIVE
Staphylococcus aureus: NEGATIVE

## 2010-09-06 NOTE — Op Note (Signed)
  NAMEWENDI, LASTRA NO.:  [id number]  MEDICAL RECORD NO.:  [id number]          PATIENT TYPE:  OIB  LOCATION:  2899                         FACILITY:  [facility]  PHYSICIAN:  Duke Salvia, MD, FACCDATE OF BIRTH:  Mar 25, 1930  DATE OF PROCEDURE: DATE OF DISCHARGE:  09/03/2010                              OPERATIVE REPORT   PREOPERATIVE DIAGNOSES:  Previously implanted cardiac resynchronization therapy defibrillator now at elective replacement indicator with intercurrent normalization of left ventricular function; postoperative device migration and subcutaneous atrophy.  POSTOPERATIVE DIAGNOSES:  Previously implanted cardiac resynchronization therapy defibrillator now at elective replacement indicator with intercurrent normalization of left ventricular function; postoperative device migration and subcutaneous atrophy.  PROCEDURE:  Explantation of a previously implanted ICD insertion of a CRT pacemaker with pocket revision moving the pocket medially.  Following obtaining informed consent, the patient was brought to the Electrophysiology Laboratory and placed on the fluoroscopic table in supine position.  After routine prep and drape, an incision was made about 1-2 cm below the prior incision where there was also significant amount of subcutaneous atrophy.  The device pocket was opened.  A cheesy material was noted.  This was cultured.  The pocket was revised medially to because of the inferolateral migration of the defibrillator pocket. Interrogation of the previously implanted LV 4194 lead, serial number [id number] demonstrated an amplitude of 9 with a pace impedance of 889, threshold 0.8 at 0.5.  The previously implanted 6949 lead, serial number [id number] demonstrated an amplitude of 12.4 with pace impedance of 740, threshold 0.7 at 0.5 and the previously implanted atrial lead [id number] demonstrated an amplitude of 5.4 with the pace impedance of 446 and a  threshold of 1 volt at 0.5.  With these acceptable parameters recorded, the system was reconnected to a Medtronic Concerto CRT-P device, Model [id number], serial number [id number].  The left ventricular - 4194 lead was placed in the RV port.  The 6949 rate sense portion was placed in the LV port.  The high-voltage leads were capped.  The leads and the pulse generator were placed in the pocket and secured to the prepectoral fascia medially.  The pocket was copiously irrigated with antibiotic containing saline solution.  Surgicel was placed on the medial aspect.  The cephalad aspect of the pocket was then closed in two layers in normal fashion. The wound was washed, dried, and a benzoin and Steri-Strip dressing was applied.  Needle counts, sponge counts, and instrument counts were correct at the end of the procedure according to the staff.  The patient tolerated the procedure without apparent complication.     Duke Salvia, MD, [facility]     SCK/MEDQ  D:  09/03/2010  T:  09/04/2010  Job:  [id number]  Electronically Signed by Sherryl Manges MD [facility] on 09/06/2010 01:42:13 PM

## 2010-09-08 ENCOUNTER — Encounter: Payer: Self-pay | Admitting: Internal Medicine

## 2010-09-10 ENCOUNTER — Encounter: Payer: Self-pay | Admitting: Emergency Medicine

## 2010-09-10 LAB — WOUND CULTURE
Culture: NO GROWTH
Gram Stain: NONE SEEN

## 2010-09-17 ENCOUNTER — Ambulatory Visit: Admission: RE | Admit: 2010-09-17 | Discharge: 2010-09-17 | Payer: Self-pay | Source: Home / Self Care

## 2010-09-17 ENCOUNTER — Encounter: Payer: Self-pay | Admitting: Internal Medicine

## 2010-09-20 NOTE — Progress Notes (Signed)
Summary: surgical clearance  Phone Note From Other Clinic Call back at [phone]   Caller: Cindy/[facility] Summary of Call: needs letter stating ok for surgery fax [phone] Initial call taken by: Migdalia Dk,  December 15, 2009 2:25 PM  Follow-up for Phone Call        Spoke with Arline Asp and confirmed she has received faxed office note from pt's office visit with Dr. Graciela Husbands on December 11, 2009 where it states pt is acceptable risk for surgery.  She states she had missed this when reviewing note earlier and will call us if furthur note is needed. Follow-up by: Dossie Arbour, RN, BSN,  December 15, 2009 2:45 PM

## 2010-09-20 NOTE — Assessment & Plan Note (Signed)
Summary: ROV. APPT IS 9:30/ SURGICAL CLEARANCE FOR COLON SURGREY/ GD   Visit Type:  Pre-op Evaluation Referring Provider:  Nicholes Mango Primary Provider:  Oneta Rack  CC:  no complaints.  History of Present Illness: [name] is a 77 year old woman with a history of congestive heart failure, secondary nonischemic cardiomyopathy, previous EF was 25-30%.  She is status post BIV/ICD.  Most recent ejection fraction was 60%.  She also has a history of COPD with chronic dyspnea, hypertension, hyperlipidemia and chronic renal insufficiency.  She underwent colostomy for a colonic obstruction the mechanism of which she is not aware. There is conversation about taking down of colostomy.  She denies changes in her shortness of breath. Has been no chest pain. There is no edema. Her cough is chronic.  Current Medications (verified): 1)  Synthroid 75 Mcg  Tabs (Levothyroxine Sodium) .... Take 1 Tablet By Mouth Once A Day 2)  Prilosec 20 Mg Cpdr (Omeprazole) .... Two Times A Day 3)  Advair Diskus 250-50 Mcg/dose  Misc (Fluticasone-Salmeterol) .... Inhale 1 Puff Two Times A Day 4)  Spiriva Handihaler 18 Mcg  Caps (Tiotropium Bromide Monohydrate) .... Inhale Contents of 1 Capsule Once Daily 5)  Ventolin Hfa 108 (90 Base) Mcg/act  Aers (Albuterol Sulfate) .[name].. 1-2 Puffs Every 4-6 Hours As Needed 6)  Benadryl 25 Mg  Tabs (Diphenhydramine Hcl) .... As Needed 7)  Diovan 320 Mg Tabs (Valsartan) .[name].. 1 By Mouth Daily 8)  Amlodipine Besylate 5 Mg Tabs (Amlodipine Besylate) .... Once Daily 9)  Coreg 12.5 Mg Tabs (Carvedilol) .... Take 1 Tablet By Mouth Two Times A Day 10)  Pravastatin Sodium 40 Mg Tabs (Pravastatin Sodium) .... Take 1 Tablet By Mouth Once A Day 11)  Align  Caps (Probiotic Product) .[name].. 1 By Mouth Daily 12)  Miralax  Powd (Polyethylene Glycol 3350) .... Once Daily 13)  Fenofibrate Micronized 134 Mg Caps (Fenofibrate Micronized) .[name].. 1 By Mouth Daily  Allergies (verified): 1)  ! Lescol 2)  !  Vytorin 3)  ! Crestor 4)  ! Lopid 5)  ! Lipitor 6)  ! Ace Inhibitors 7)  ! Bisoprolol Fumarate (Bisoprolol Fumarate) 8)  ! * Tylenol With Codeine 9)  Lisinopril (Lisinopril) 10)  * Oxycotin  Vital Signs:  Patient profile:   77 year old female Height:      64 inches Weight:      126 pounds Pulse rate:   68 / minute Resp:     18 per minute BP sitting:   124 / 64  (right arm)  Vitals Entered By: Marrion Coy, CNA (December 11, 2009 2:54 PM)  Physical Exam  General:  The patient was alert and oriented in no acute distress. HEENT Normal.  Neck veins were flat, carotids were brisk.  Lungs have rhonchi and wheezing throughout right dorsum left Heart sounds were regular without murmurs or gallops.  there is significance thinning of the skin overlying her ICD. The skin was easily over the wires and the device itself Abdomen was soft with active bowel sounds. There is no clubbing cyanosis or edema. Skin Warm and dry     ICD Specifications Following MD:  Sherryl Manges, MD     ICD Vendor:  Medtronic     ICD Model Number:  7304     ICD Serial Number:  [id number] ICD DOI:  11/16/2004     ICD Implanting MD:  Sherryl Manges, MD  Lead 1:    Location: RA     DOI: 11/16/2004  Model #: [id number]     Serial #: [id number]     Status: active Lead 2:    Location: RV     DOI: 11/16/2004     Model #: [id number]     Serial #: [id number]     Status: active Lead 3:    Location: LV     DOI: 11/16/2004     Model #: [id number]     Serial #: [id number]     Status: active  Indications::  NICM, CHF   ICD Follow Up Remote Check?  No Battery Voltage:  2.69 V     Charge Time:  10.36 seconds     Underlying rhythm:  SR ICD Dependent:  No       ICD Device Measurements Atrium:  Amplitude: 3.5 mV, Impedance: 424 ohms, Threshold: 1.0 V at 0.4 msec Right Ventricle:  Amplitude: 10.6 mV, Impedance: 664 ohms, Threshold: 1.0 V at 0.4 msec Left Ventricle:  Impedance: 664 ohms, Threshold: 1.5 V at 0.4 msec Configuration:  BIPOLAR Shock Impedance: 50/61 ohms   Episodes MS Episodes:  5     Coumadin:  No Shock:  0     ATP:  0     Nonsustained:  0     Atrial Pacing:  0%     Ventricular Pacing:  100%  Brady Parameters Mode DDD     Lower Rate Limit:  50     Upper Rate Limit 130 PAV 110     Sensed AV Delay:  80  Tachy Zones VF:  200     VT:  240 FVT VIA VF     VT1:  171     Next Cardiology Appt Due:  02/16/2010 Tech Comments:  Normal device function.  No changes made today.  Pt seen for pre-op clearance for reversal of colostomy.  [phone] lead stable.  Letter and magnet given to patient today.  Pt unable to hear alert tones.  Mode switch episodes total less than 2 minutes.  No EGM's.  ROV 3 months clinic. Gypsy Balsam RN BSN  December 11, 2009 3:07 PM   Impression & Recommendations:  Problem # 1:  IMPLANTATION OF DEFIBRILLATOR, MEDTRONIC MAXIMO 730X (ICD-V45.02) she is status post CRT-D. Previous cardiogram hyper for upper respiratory fall she decide to cardiogram shows a left bundle branch block morphology. Chest x-rays were reviewed comparing January 2009 the position of both of these were similar.  As her ejection fraction is really normalized at least as of last year echo, will not press too hard at this time in the event that there is LV lead dislodgment we'll obtain a chest x-ray today to look for this.  Problem # 2:  [id number] LEAD (ICD-996.04) tlead is stable. The patient has been reminded of what to do in the event that she has shocks.  Problem # 3:  CARDIOMYOPATHY, SECONDARY (ICD-425.9) doing maintained on her current medications. She is acceptable risk for surgery Her updated medication list for this problem includes:    Diovan 320 Mg Tabs (Valsartan) .[name]... 1 by mouth daily    Amlodipine Besylate 5 Mg Tabs (Amlodipine besylate) ..... Once daily    Coreg 12.5 Mg Tabs (Carvedilol) .[name]... Take 1 tablet by mouth two times a day  Other Orders: T-2 View CXR (71020TC)  Patient Instructions: 1)  A chest x-ray  takes a picture of the organs and structures inside the chest, including the heart, lungs, and blood vessels. This test can show several things, including, whether the heart is  enlarged; whether fluid is building up in the lungs; and whether pacemaker / defibrillator leads are still in place. 2)  You are scheduled for a device check from home on  March 15, 2010. You may send your transmission at any time that day. If you have a wireless device, the transmission will be sent automatically. After your physician reviews your transmission, you will receive a postcard with your next transmission date.

## 2010-09-20 NOTE — Miscellaneous (Signed)
Summary: Device change out  Clinical Lists Changes  Observations: Added new observation of PPM INDICATN: NICM (09/05/2010 10:30) Added new observation of MAGNET RTE: BOL 85 ERI 65 (09/05/2010 10:30) Added new observation of PPMLEADSTAT3: active (09/05/2010 10:30) Added new observation of PPMLEADSER3: [id number] (09/05/2010 10:30) Added new observation of PPMLEADMOD3: 6949  (09/05/2010 10:30) Added new observation of PPMLEADDOI3: 11/16/2004  (09/05/2010 10:30) Added new observation of PPMLEADLOC3: LV  (09/05/2010 10:30) Added new observation of PPMLEADSTAT2: active  (09/05/2010 10:30) Added new observation of PPMLEADSER2: [id number]  (09/05/2010 10:30) Added new observation of PPMLEADMOD2: 4194  (09/05/2010 10:30) Added new observation of PPMLEADDOI2: 11/16/2004  (09/05/2010 10:30) Added new observation of PPMLEADLOC2: RV  (09/05/2010 10:30) Added new observation of PPMLEADSTAT1: active  (09/05/2010 10:30) Added new observation of PPMLEADSER1: [id number] V  (09/05/2010 10:30) Added new observation of PPMLEADMOD1: 5076  (09/05/2010 10:30) Added new observation of PPMLEADDOI1: 11/16/2004  (09/05/2010 10:30) Added new observation of PPMLEADLOC1: RA  (09/05/2010 10:30) Added new observation of PPM IMP MD: Sherryl Manges, MD  (09/05/2010 10:30) Added new observation of PPM DOI: 09/03/2010  (09/05/2010 10:30) Added new observation of PPM SERL#: [id number]  (09/05/2010 10:30) Added new observation of PPM MODL#: [id number]  (09/05/2010 62:95) Added new observation of PACEMAKERMFG: Medtronic  (09/05/2010 10:30) Added new observation of PACEMAKER MD: Sherryl Manges, MD  (09/05/2010 10:30) Added new observation of ICD MODL#: 7304  (09/05/2010 10:30) Added new observation of ICDEXPLCOMM: 09/03/2010  Medtronic Maximo [id number] H explanted  (09/05/2010 10:30)      PPM Specifications Following MD:  Sherryl Manges, MD     PPM Vendor:  Medtronic     PPM Model Number:  [id number]     PPM Serial Number:   [id number] PPM DOI:  09/03/2010     PPM Implanting MD:  Sherryl Manges, MD  Lead 1    Location: RA     DOI: 11/16/2004     Model #: [id number]     Serial #: [id number]     Status: active Lead 2    Location: RV     DOI: 11/16/2004     Model #: 4194     Serial #: [id number]     Status: active Lead 3    Location: LV     DOI: 11/16/2004     Model #: [id number]     Serial #: [id number]     Status: active  Magnet Response Rate:  BOL 85 ERI 65  Indications:  NICM   ICD Specifications Following MD:  Sherryl Manges, MD     ICD Vendor:  Medtronic     ICD Model Number:  7304     ICD Serial Number:  [id number] ICD DOI:  11/16/2004     ICD Implanting MD:  Sherryl Manges, MD  Lead 1:    Location: RA     DOI: 11/16/2004     Model #: 2542     Serial #: [id number]     Status: active Lead 2:    Location: RV     DOI: 11/16/2004     Model #: 3151     Serial #: [id number]     Status: active Lead 3:    Location: LV     DOI: 11/16/2004     Model #: [id number]     Serial #: [id number]     Status: active  Indications::  NICM, CHF  Explantation Comments: 09/03/2010  Medtronic Maximo [id number] H explanted  ICD Follow Up ICD Dependent:  No       ICD  Device Measurements Configuration: BIPOLAR  Episodes Coumadin:  No  Brady Parameters Mode DDD     Lower Rate Limit:  50     Upper Rate Limit 130 PAV 110     Sensed AV Delay:  80  Tachy Zones VF:  200     VT:  240 FVT VIA VF     VT1:  171

## 2010-09-20 NOTE — Assessment & Plan Note (Signed)
Summary: [name]   Referring Provider:  Nicholes Mango Primary Provider:  Oneta Rack  CC:  eph.  .  History of Present Illness: [name] is a 77 year old woman with a history of congestive heart failure, secondary nonischemic cardiomyopathy, previous EF was 25-30%.  She is status post BIV/ICD.  Most recent ejection fraction was 60%.  She also has a history of COPD with chronic dyspnea, hypertension, hyperlipidemia and chronic renal insufficiency.  [place]urns for follow-up.  She underwent colostomy for a colonic obstruction in January 2011. Underwent takedown with extensive lysis of adhesions in May.  Earlier this month admitted with colitis. No obstruction on CT.  A few weeks ago was having edema and hctz added to her Diovan. Edema has resolved. Feels like she is starting to get stronger. Still with mild chronic shortness of breath. No chest pain.   Current Medications (verified): 1)  Synthroid 75 Mcg  Tabs (Levothyroxine Sodium) .... Take 1 Tablet By Mouth Once A Day 2)  Prilosec 20 Mg Cpdr (Omeprazole) .... Two Times A Day 3)  Spiriva Handihaler 18 Mcg  Caps (Tiotropium Bromide Monohydrate) .... Inhale Contents of 1 Capsule Once Daily 4)  Ventolin Hfa 108 (90 Base) Mcg/act  Aers (Albuterol Sulfate) .[name].. 1-2 Puffs Every 4-6 Hours As Needed 5)  Benadryl 25 Mg  Tabs (Diphenhydramine Hcl) .... As Needed 6)  Diovan Hct 160-12.5 Mg Tabs (Valsartan-Hydrochlorothiazide) .... Take 1 Tablet By Mouth Once A Day 7)  Amlodipine Besylate 5 Mg Tabs (Amlodipine Besylate) .... Once Daily 8)  Coreg 12.5 Mg Tabs (Carvedilol) .... Take 1 Tablet By Mouth Two Times A Day 9)  Miralax  Powd (Polyethylene Glycol 3350) .... Once Daily 10)  Fenofibrate Micronized 134 Mg Caps (Fenofibrate Micronized) .[name].. 1 By Mouth Daily 11)  Hydrochlorothiazide 25 Mg Tabs (Hydrochlorothiazide) .[name].. 1 By Mouth Daily 12)  Amoxicillin 250 Mg Caps (Amoxicillin) .[name].. 1 By Mouth Three Times A Day For Uti For 2 Weeks 13)  Zofran Odt 8  Mg Tbdp (Ondansetron) .[name].. 1 By Mouth Daily As Needed For Nausea 14)  Fluticasone Propionate 50 Mcg/act Susp (Fluticasone Propionate) .... 2 Sprays Each Nostril Two Times A Day 15)  Diltiazem Hcl Er Beads 240 Mg Xr24h-Cap (Diltiazem Hcl Er Beads) .... Take One Capsule Once Daily  Allergies (verified): 1)  ! Lescol 2)  ! Vytorin 3)  ! Crestor 4)  ! Lopid 5)  ! Lipitor 6)  ! Ace Inhibitors 7)  ! Bisoprolol Fumarate (Bisoprolol Fumarate) 8)  ! * Tylenol With Codeine 9)  Lisinopril (Lisinopril) 10)  * Oxycotin  Past History:  Past Medical History: Last updated: 07/31/2009 Congestive heart failure due to NICM     --EF previoulsy 25%    --ECHO 5/10 60-65%    --s/p ICD--Medtronic Maximo 730x COPD with chronic dyspnea Hypertension Hypothyroidism  Review of Systems       As per HPI and past medical history; otherwise all systems negative.   Vital Signs:  Patient profile:   77 year old female Height:      64 inches Weight:      123 pounds BMI:     21.19 Pulse rate:   90 / minute BP sitting:   142 / 76  (right arm) Cuff size:   regular  Vitals Entered By: Judithe Modest CMA (March 14, 2010 4:09 PM)  Physical Exam  General:  Elderly thin woman in no acute distress.  Ambulates around the clinic slowly without any respiratory difficulty. HEENT:  Normal.  NECK:  Supple.  No JVD.  Carotids are 2+ bilaterally without any bruits. There is no lymphadenopathy or thyromegaly. LUNGS:  Clear with diminished breath sounds throughout and mild end- expiratory wheezing. CARDIAC:  PMI is nondisplaced.  She has very distant heart sounds with an S4.  No obvious murmur. ABDOMEN:  Soft, nontender, nondistended.  No hepatosplenomegaly, no bruits, no mass. EXTREMITIES:  Warm with no cyanosis, clubbing or edema.  No rash. NEURO:  Alert and oriented x3.  Cranial nerves II-XII are intact.  Moves all 4 extremities  without difficulty.  Affect is pleasant.      ICD Specifications Following MD:   Sherryl Manges, MD     ICD Vendor:  Medtronic     ICD Model Number:  7304     ICD Serial Number:  [id number] ICD DOI:  11/16/2004     ICD Implanting MD:  Sherryl Manges, MD  Lead 1:    Location: RA     DOI: 11/16/2004     Model #: 5076     Serial #: [id number]     Status: active Lead 2:    Location: RV     DOI: 11/16/2004     Model #: 7829     Serial #: [id number]     Status: active Lead 3:    Location: LV     DOI: 11/16/2004     Model #: 7846     Serial #: [id number]     Status: active  Indications::  NICM, CHF   ICD Follow Up ICD Dependent:  No       ICD Device Measurements Configuration: BIPOLAR  Episodes Coumadin:  No  Brady Parameters Mode DDD     Lower Rate Limit:  50     Upper Rate Limit 130 PAV 110     Sensed AV Delay:  80  Tachy Zones VF:  200     VT:  240 FVT VIA VF     VT1:  171     Impression & Recommendations:  Problem # 1:  CARDIOMYOPATHY, SECONDARY (ICD-425.9) Doing well. EF recovered. Volume status looks good. I am concerned over possibility of hypokalemia with recent addition of HCTZ. Check BMET.   Problem # 2:  HYPERTENSION (ICD-401.9) BP just mildly elevated. F/u with Dr. Oneta Rack. Can increase Diovan or amlodipine as needed.   Other Orders: EKG w/ Interpretation (93000)  Patient Instructions: 1)  Your physician recommends that you continue on your current medications as directed. Please refer to the Current Medication list given to you today. 2)  Your physician recommends you return 03/15/2010 for BMET-401.9

## 2010-09-20 NOTE — Letter (Signed)
Summary: Dr Perrin Maltese Office Note  Dr Angelia Mould Ingram's Office Note   Imported By: Roderic Ovens 05/17/2010 11:19:49  _____________________________________________________________________  External Attachment:    Type:   Image     Comment:   External Document

## 2010-09-20 NOTE — Cardiovascular Report (Signed)
Summary: Office Visit Remote   Office Visit Remote   Imported By: Roderic Ovens 08/02/2010 14:09:52  _____________________________________________________________________  External Attachment:    Type:   Image     Comment:   External Document

## 2010-09-20 NOTE — Cardiovascular Report (Signed)
Summary: Office Visit   Office Visit   Imported By: Roderic Ovens 12/12/2009 16:11:19  _____________________________________________________________________  External Attachment:    Type:   Image     Comment:   External Document

## 2010-09-20 NOTE — Assessment & Plan Note (Signed)
Summary: cough, COPD   Visit Type:  Follow-up Copy to:  Nicholes Mango Primary Provider/Referring Provider:  Oneta Rack  CC:  COPD. Increased sob with exertion. Cough with yellow mucus. She is using her flutter valve 2 to 3 times daily and says this helps get more mucus up.[name]  History of Present Illness: [name] is a 77 year old woman with COPD that is in the mild to moderate range.  She is followed by Dr Gala Romney for congestive heart failure secondary to nonischemic cardiomyopathy.  Previous EF was 25-30%.  She is status post BiV ICD and her most recent EF was 60%. She also has diastolic dysfunction and a history of pulmonary edema.  Has OSA but unable to tolerate CPAP.   ROV 02/14/09 -- returns for f/u. Still with exertional dyspnea. Her cough and mucous are better since we treated her for bronchitis with azithromycin. Phlegm is now light yellow, less frequent. Sometimes getting choked up on saliva?,  has some chest wall pain from cough. Uses ventolin   ROV 06/12/09 -- returns for regular follow up of her mild to mod COPD, diastolic dysfxn and pulm HTN. Breathing has been fairly stable, has some bad days especially when it is humid. Tried CPAP for her OSA but unable to tolerate (returned it to the company). Uses Ventolin 2 - 3x a week. No exacerbations or hospitalizations since last visit. Still has episodes of coughing after taking by mouth, sometimes she gets choked up on her saliva.   ROV 11/08/09 -- returns for f/u. Unfortunately since last visit she had bowel obstruction and required bowel resection. She is getting her strength back. Continues to have daily cough, moreso than prior to her hospitalization. Taking Advair + Spiriva. She gets coughing spells after she uses Advair. Also still has symptoms suggestive of aspiration. Swallow eval showed evidence for food not  clearing. She is on an ARB, no ACE-I.   ROV 03/08/10 -- returns for her COPD, systolic/diastolic CHF, OSA. Since last visit she  had reversal of her colostomy, still doesn't have her strength back. She still has cough. Has been using flutter valve, has been trying to practice swallowing precautions. She coughs whenever she takes her Advair. Seems to tolerate the Spiriva. She has had some increased LE edema, currently seems to be at baseline.   Current Medications (verified): 1)  Synthroid 75 Mcg  Tabs (Levothyroxine Sodium) .... Take 1 Tablet By Mouth Once A Day 2)  Prilosec 20 Mg Cpdr (Omeprazole) .... Two Times A Day 3)  Advair Diskus 250-50 Mcg/dose  Misc (Fluticasone-Salmeterol) .... Inhale 1 Puff Two Times A Day 4)  Spiriva Handihaler 18 Mcg  Caps (Tiotropium Bromide Monohydrate) .... Inhale Contents of 1 Capsule Once Daily 5)  Ventolin Hfa 108 (90 Base) Mcg/act  Aers (Albuterol Sulfate) .[name].. 1-2 Puffs Every 4-6 Hours As Needed 6)  Benadryl 25 Mg  Tabs (Diphenhydramine Hcl) .... As Needed 7)  Diovan Hct 160-12.5 Mg Tabs (Valsartan-Hydrochlorothiazide) .... Take 1 Tablet By Mouth Once A Day 8)  Amlodipine Besylate 5 Mg Tabs (Amlodipine Besylate) .... Once Daily 9)  Coreg 12.5 Mg Tabs (Carvedilol) .... Take 1 Tablet By Mouth Two Times A Day 10)  Miralax  Powd (Polyethylene Glycol 3350) .... Once Daily 11)  Fenofibrate Micronized 134 Mg Caps (Fenofibrate Micronized) .[name].. 1 By Mouth Daily 12)  Hydrochlorothiazide 25 Mg Tabs (Hydrochlorothiazide) .[name].. 1 By Mouth Daily 13)  Amoxicillin 250 Mg Caps (Amoxicillin) .[name].. 1 By Mouth Three Times A Day For Uti For 2 Weeks  14)  Zofran Odt 8 Mg Tbdp (Ondansetron) .[name].. 1 By Mouth Daily As Needed For Nausea  Allergies (verified): 1)  ! Lescol 2)  ! Vytorin 3)  ! Crestor 4)  ! Lopid 5)  ! Lipitor 6)  ! Ace Inhibitors 7)  ! Bisoprolol Fumarate (Bisoprolol Fumarate) 8)  ! * Tylenol With Codeine 9)  Lisinopril (Lisinopril) 10)  * Oxycotin  Vital Signs:  Patient profile:   77 year old female Height:      64 inches (162.56 cm) Weight:      125 pounds (56.82 kg) BMI:      21.53 O2 Sat:      91 % on Room air Temp:     97.7 degrees F (36.50 degrees C) oral Pulse rate:   63 / minute BP sitting:   118 / 62  (right arm) Cuff size:   regular  Vitals Entered By: Michel Bickers CMA (March 08, 2010 11:51 AM)  O2 Sat at Rest %:  91 O2 Flow:  Room air CC: COPD. Increased sob with exertion. Cough with yellow mucus. She is using her flutter valve 2 to 3 times daily and says this helps get more mucus up. Comments Medications reviewed with the patient. Daytime phone verified. Michel Bickers [facility]  March 08, 2010 11:52 AM   Physical Exam  General:  normal appearance, healthy appearing, and thin.   Head:  normocephalic and atraumatic Nose:  some mild nasal congestion, no active gtt Mouth:  no deformity or lesions Neck:  no masses, thyromegaly, or abnormal cervical nodes Chest Wall:  no deformities noted Lungs:  scattered rhonchi, wheeze on forced expiration Heart:  regular rate and rhythm, S1, S2 without murmurs, rubs, gallops, or clicks Abdomen:  not examined Extremities:  trace edema Skin:  intact without lesions or rashes Psych:  alert and cooperative; normal mood and affect; normal attention span and concentration   Impression & Recommendations:  Problem # 1:  COPD (ICD-496) - d/c advair ( ? related to cough) - contin spiriva - as needed SABA  Problem # 2:  COUGH (ICD-786.2) - stop advair - flutter two times a day - start fluticasone spray - benadryl - reassess 2 mo - swallow precautions  Medications Added to Medication List This Visit: 1)  Hydrochlorothiazide 25 Mg Tabs (Hydrochlorothiazide) .[name].. 1 by mouth daily 2)  Amoxicillin 250 Mg Caps (Amoxicillin) .[name].. 1 by mouth three times a day for uti for 2 weeks 3)  Zofran Odt 8 Mg Tbdp (Ondansetron) .[name].. 1 by mouth daily as needed for nausea 4)  Fluticasone Propionate 50 Mcg/act Susp (Fluticasone propionate) .... 2 sprays each nostril two times a day  Other Orders: Est. Patient Level IV ([address])  Patient  Instructions: 1)  Stop your Advair 2)  Continue your Spiriva once daily  3)  Continue to use your flutter valve two times a day  4)  Continue to tuck your chin with swallowing, use small bites, drink water after each bite.  5)  Continue your benadryl at bedtime  6)  Start fluticasone nasal spray, 2 sprays each nostril two times a day  7)  Follow up with Dr Delton Coombes in 2 months to review your symptoms.  Prescriptions: FLUTICASONE PROPIONATE 50 MCG/ACT SUSP (FLUTICASONE PROPIONATE) 2 sprays each nostril two times a day  #1 x 5   Entered and Authorized by:   Leslye Peer MD   Signed by:   Leslye Peer MD on 03/08/2010   Method used:  Electronically to        [facility] # [id number]* (retail)       [address]       [place], [place]  [address]       Ph: [phone] or [phone]       Fax: [phone]   RxID:   [id number]

## 2010-09-20 NOTE — Letter (Signed)
Summary: Dr Delon [place] Office Note  Dr Delon [place] Office Note   Imported By: Roderic Ovens 12/13/2009 15:54:40  _____________________________________________________________________  External Attachment:    Type:   Image     Comment:   External Document

## 2010-09-20 NOTE — Cardiovascular Report (Signed)
Summary: Office Visit   Office Visit   Imported By: Roderic Ovens 03/16/2010 10:21:39  _____________________________________________________________________  External Attachment:    Type:   Image     Comment:   External Document

## 2010-09-20 NOTE — Progress Notes (Signed)
Summary: Question about ICD  Phone Note Call from Patient Call back at Home Phone [phone]   Caller: Patient Summary of Call: Pt calling with question about ICD  Initial call taken by: Judie Grieve,  July 23, 2010 10:30 AM  Follow-up for Phone Call        pt stated that she understands that she is getting close to [facility] and sent in a transmission on the 30th.  Don't see the results for that and will follow-up with device clinic.  Follow-up by: Claris Gladden RN,  July 23, 2010 11:09 AM  Additional Follow-up for Phone Call Additional follow up Details #1::        scheduled appt for 12/13 at 2 to discuss gen change and possible 6949 revision.  Additional Follow-up by: Claris Gladden RN,  July 23, 2010 3:31 PM

## 2010-09-20 NOTE — Consult Note (Signed)
Summary: [facility]   Imported By: Roderic Ovens 09/15/2009 14:10:12  _____________________________________________________________________  External Attachment:    Type:   Image     Comment:   External Document

## 2010-09-20 NOTE — Cardiovascular Report (Signed)
Summary: Office Visit Remote   Office Visit Remote   Imported By: Roderic Ovens 11/01/2009 11:33:03  _____________________________________________________________________  External Attachment:    Type:   Image     Comment:   External Document

## 2010-09-20 NOTE — Cardiovascular Report (Signed)
Summary: Office Visit Remote   Office Visit Remote   Imported By: Roderic Ovens 07/02/2010 15:09:32  _____________________________________________________________________  External Attachment:    Type:   Image     Comment:   External Document

## 2010-09-20 NOTE — Letter (Signed)
Summary: Device-Delinquent Phone Journalist, newspaper, Main Office  [address]   [place], [place] [address]   Phone: [phone]  Fax: [phone]     July 16, 2010 MRN: [id number]   KEMARA QUIGLEY [address] [place], [place]  [address]   Dear [name],  According to our records, you were scheduled for a device phone transmission on 07-12-2010.     We did not receive any results from this check.  If you transmitted on your scheduled day, please call us to help troubleshoot your system.  If you forgot to send your transmission, please send one upon receipt of this letter.  Thank you,   Architectural technologist [facility]

## 2010-09-20 NOTE — Letter (Signed)
Summary: Remote Device Check  [facility], Main Office  [address]   [place], [place] [address]   Phone: [phone]  Fax: [phone]     May 02, 2010 MRN: [id number]   [name] [address] [place], [place]  [address]   Dear [name],   Your remote transmission was recieved and reviewed by your physician.  All diagnostics were within normal limits for you.  __X___Your next transmission is scheduled for:  05-17-10.  Please transmit at any time this day.  If you have a wireless device your transmission will be sent automatically.   Sincerely,  [name]

## 2010-09-20 NOTE — Progress Notes (Signed)
Summary: pt has edema in legs and feet  Phone Note Call from Patient Call back at Home Phone [phone]   Caller: Patient Reason for Call: Talk to Nurse, Talk to Doctor Summary of Call: pt has edema in her legs and feet and she wants to talk to you about it Initial call taken by: Omer Jack,  March 01, 2010 12:15 PM  Follow-up for Phone Call        for the past 3 days her feet and legs have been swollen, gets some better overnight, she tries to keep them elevated during the day but it doesn't seem to help, her feet are beginning to get painful and she is getting concerned, she is not on any diuretic, no unusal SOB for her, she is sch to see Dr Gala Romney on 7/27, discussed fluid and salt intake and she is aware of these issues and watches closely, will discuss w/DOD and call pt back Meredith Staggers, RN  March 01, 2010 12:44 PM   Additional Follow-up for Phone Call Additional follow up Details #1::        discussed w/Dr Graciela Husbands add hctz to pts diovan, pt is aware and states she is almost out of her diovan anyway, but she does state upon d/c from hospital on 7/6 they decreased her diovan to 320mg  1/2 tab daily, that is what d/c summary states, new rx for diovan hct 160/12.5 sent to pharmacy, will check bmet when pt comes in for appt on 7/27 Meredith Staggers, RN  March 01, 2010 4:18 PM     New/Updated Medications: DIOVAN HCT 160-12.5 MG TABS (VALSARTAN-HYDROCHLOROTHIAZIDE) Take 1 tablet by mouth once a day Prescriptions: DIOVAN HCT 160-12.5 MG TABS (VALSARTAN-HYDROCHLOROTHIAZIDE) Take 1 tablet by mouth once a day  #30 x 6   Entered by:   Meredith Staggers, RN   Authorized by:   Dolores Patty, MD, [facility]   Signed by:   Meredith Staggers, RN on 03/01/2010   Method used:   Electronically to        [id number] (retail)       [address]       [place], [place]  [address]       Ph: [phone] or [phone]       Fax: [phone]   RxID:   [id number]

## 2010-09-20 NOTE — Letter (Signed)
Summary: Implantable Device Instructions  Architectural technologist, Main Office  [address]   [place], [place] [address]   Phone: [phone]  Fax: [phone]      Implantable Device Instructions  You are scheduled for:  __x___ CRT-Defib change out to CRT-Pacemaker with 6949 lead revision   on September 03, 2010 at 1145 with Dr. Graciela Husbands.  1.  Please arrive at the Short Stay Center at [facility] at 9:45 am on the day of your procedure.  2.  Do not eat or drink after midnight the night before your procedure.  3.  Complete lab work on August 28, 2010 at a lab of your choice (prescription given).    You do not have to be fasting.  4.  Do NOT take these medications for the morning of your procedure:  Diovan and Hydrochlorothiazide.  Take all other morning medications with a few sips of water.  5.  Plan for an overnight stay.  Bring your insurance cards and a list of your medications.  6.  Wash your chest and neck with antibacterial soap (any brand) the evening before and the morning of your procedure.  Rinse well.   *If you have ANY questions after you get home, please call the office [phone].  Claris Gladden, RN  *Every attempt is made to prevent procedures from being rescheduled.  Due to the nature of Electrophysiology, rescheduling can happen.  The physician is always aware and directs the staff when this occurs.

## 2010-09-20 NOTE — Cardiovascular Report (Signed)
Summary: Office Visit Remote   Office Visit Remote   Imported By: Roderic Ovens 05/08/2010 11:55:09  _____________________________________________________________________  External Attachment:    Type:   Image     Comment:   External Document

## 2010-09-20 NOTE — Assessment & Plan Note (Signed)
Summary: COPD, OSA, cough   Visit Type:  Follow-up Copy to:  Nicholes Mango Primary Provider/Referring Provider:  Oneta Rack  CC:  COPD follow-up...the patient says her breathing has not changed since stopping Advair...cough is still present with yellow mucus.  History of Present Illness: Ms. Swetz is a 77 year old woman with COPD that is in the mild to moderate range.  She is followed by Dr Gala Romney for congestive heart failure secondary to nonischemic cardiomyopathy.  Previous EF was 25-30%.  She is status post BiV ICD and her most recent EF was 60%. She also has diastolic dysfunction and a history of pulmonary edema.  Has OSA but unable to tolerate CPAP.   ROV 06/12/09 -- returns for regular follow up of her mild to mod COPD, diastolic dysfxn and pulm HTN. Breathing has been fairly stable, has some bad days especially when it is humid. Tried CPAP for her OSA but unable to tolerate (returned it to the company). Uses Ventolin 2 - 3x a week. No exacerbations or hospitalizations since last visit. Still has episodes of coughing after taking by mouth, sometimes she gets choked up on her saliva.   ROV 11/08/09 -- returns for f/u. Unfortunately since last visit she had bowel obstruction and required bowel resection. She is getting her strength back. Continues to have daily cough, moreso than prior to her hospitalization. Taking Advair + Spiriva. She gets coughing spells after she uses Advair. Also still has symptoms suggestive of aspiration. Swallow eval showed evidence for food not  clearing. She is on an ARB, no ACE-I.   ROV 03/08/10 -- returns for her COPD, systolic/diastolic CHF, OSA. Since last visit she had reversal of her colostomy, still doesn't have her strength back. She still has cough. Has been using flutter valve, has been trying to practice swallowing precautions. She coughs whenever she takes her Advair. Seems to tolerate the Spiriva. She has had some increased LE edema, currently seems to be  at baseline.   ROV 05/14/10 -- 77 yo woman, moderate COPD, NICM s/p BiV pacer, OSA (not on CPAP). Last time we discussed her cough, stopped Advair and continued Spiriva. Also started fluticasone nasal spray at bedtime. Her cough is better. Hasn't really missed the Advair, breathing is stable.   Current Medications (verified): 1)  Synthroid 75 Mcg  Tabs (Levothyroxine Sodium) .... Take 1 Tablet By Mouth Once A Day 2)  Prilosec 20 Mg Cpdr (Omeprazole) .... Two Times A Day 3)  Spiriva Handihaler 18 Mcg  Caps (Tiotropium Bromide Monohydrate) .... Inhale Contents of 1 Capsule Once Daily 4)  Ventolin Hfa 108 (90 Base) Mcg/act  Aers (Albuterol Sulfate) .[name].. 1-2 Puffs Every 4-6 Hours As Needed 5)  Benadryl 25 Mg  Tabs (Diphenhydramine Hcl) .... As Needed 6)  Diovan Hct 160-12.5 Mg Tabs (Valsartan-Hydrochlorothiazide) .... Take 1 Tablet By Mouth Once A Day 7)  Amlodipine Besylate 5 Mg Tabs (Amlodipine Besylate) .... Once Daily 8)  Coreg 12.5 Mg Tabs (Carvedilol) .... Take 1 Tablet By Mouth Two Times A Day 9)  Miralax  Powd (Polyethylene Glycol 3350) .... Once Daily 10)  Fenofibrate Micronized 134 Mg Caps (Fenofibrate Micronized) .[name].. 1 By Mouth Every Other Day 11)  Hydrochlorothiazide 25 Mg Tabs (Hydrochlorothiazide) .[name].. 1 By Mouth Daily 12)  Zofran Odt 8 Mg Tbdp (Ondansetron) .[name].. 1 By Mouth Daily As Needed For Nausea 13)  Fluticasone Propionate 50 Mcg/act Susp (Fluticasone Propionate) .... 2 Sprays Each Nostril At Bedtime 14)  Diltiazem Hcl Er Beads 240 Mg Xr24h-Cap (Diltiazem Hcl Er  Beads) .... Take One Capsule Once Daily  Allergies (verified): 1)  ! Lescol 2)  ! Vytorin 3)  ! Crestor 4)  ! Lopid 5)  ! Lipitor 6)  ! Ace Inhibitors 7)  ! Bisoprolol Fumarate (Bisoprolol Fumarate) 8)  ! * Tylenol With Codeine 9)  Lisinopril (Lisinopril) 10)  * Oxycotin  Vital Signs:  Patient profile:   77 year old female Height:      64 inches (162.56 cm) Weight:      125 pounds (56.82 kg) BMI:      21.53 O2 Sat:      96 % on Room air Temp:     98.1 degrees F (36.72 degrees C) oral Pulse rate:   90 / minute BP sitting:   120 / 78  (left arm) Cuff size:   regular  Vitals Entered By: Michel Bickers CMA (May 14, 2010 1:25 PM)  O2 Sat at Rest %:  96 O2 Flow:  Room air CC: COPD follow-up...the patient says her breathing has not changed since stopping Advair...cough is still present with yellow mucus Comments Medications reviewed with the patient. Daytime phone verified, Michel Bickers CMA  May 14, 2010 1:32 PM   Physical Exam  General:  normal appearance, healthy appearing, and thin.   Head:  normocephalic and atraumatic Nose:  some mild nasal congestion, no active gtt Mouth:  no deformity or lesions Neck:  no masses, thyromegaly, or abnormal cervical nodes Chest Wall:  no deformities noted Lungs:  scattered rhonchi, wheeze on forced expiration Heart:  regular rate and rhythm, S1, S2 without murmurs, rubs, gallops, or clicks Abdomen:  not examined Extremities:  trace edema Skin:  intact without lesions or rashes Psych:  alert and cooperative; normal mood and affect; normal attention span and concentration   Impression & Recommendations:  Problem # 1:  COPD (ICD-496) - continue Spiriva + albuterol as needed  - flu shot - rov in 6 months or as needed   Problem # 2:  COUGH (ICD-786.2)  - trial stopping fluticasone  - continue benadryl - prilosec two times a day   Orders: Est. Patient Level IV ([address])  Medications Added to Medication List This Visit: 1)  Fenofibrate Micronized 134 Mg Caps (Fenofibrate micronized) .[name].. 1 by mouth every other day 2)  Fluticasone Propionate 50 Mcg/act Susp (Fluticasone propionate) .... 2 sprays each nostril at bedtime  Patient Instructions: 1)  Continue your Spiriva once daily  2)  Continue your ventolin as needed  3)  Continue your benadryl at bedtime  4)  Stop fluticasone spray. If your cough worsens then we can consider  restarting 5)  You need a flu shot 6)  Continue your swallowing precautions.  7)  Follow up with Dr Delton Coombes in 6 months or as needed   Appended Document: Orders Update    Clinical Lists Changes  Orders: Added new Service order of Flu Vaccine 70yrs + MEDICARE PATIENTS (W2956) - Signed Added new Service order of Administration Flu vaccine - MCR (O1308) - Signed Observations: Added new observation of FLU VAX VIS: 03/13/2010 version (05/14/2010 15:38) Added new observation of FLU VAXLOT: [id number] (05/14/2010 15:38) Added new observation of FLU VAXMFR: [employer] (05/14/2010 15:38) Added new observation of FLU VAX EXP: 02/16/2011 (05/14/2010 15:38) Added new observation of FLU VAX DSE: 0.33ml (05/14/2010 15:38) Added new observation of FLU VAX: Fluvax 3+ (05/14/2010 15:38)Flu Vaccine Consent Questions     Do you have a history of severe allergic reactions to this vaccine? no  Any prior history of allergic reactions to egg and/or gelatin? no    Do you have a sensitivity to the preservative Thimersol? no    Do you have a past history of Guillan-Barre Syndrome? no    Do you currently have an acute febrile illness? no    Have you ever had a severe reaction to latex? no    Vaccine information given and explained to patient? yes    Are you currently pregnant? no    Lot Number:[id number]   Exp Date:02/16/2011   Site Given  Right Deltoid IM of FLU VAX EXP: 02/16/2011 (05/14/2010 15:38) Added new observation of FLU VAX DSE: 0.85ml (05/14/2010 15:38) Added new observation of FLU VAX: Fluvax 3+ (05/14/2010 15:38)     .lbmedflu

## 2010-09-20 NOTE — Cardiovascular Report (Signed)
Summary: Office Visit Remote   Office Visit Remote   Imported By: Roderic Ovens 07/06/2010 14:05:18  _____________________________________________________________________  External Attachment:    Type:   Image     Comment:   External Document

## 2010-09-20 NOTE — Letter (Signed)
Summary: Remote Device Check  [employer], Main Office  [address]   [place], [place] [address]   Phone: [phone]  Fax: [phone]     July 04, 2010 MRN: [id number]   [name] [address] [place], [place]  [address]   Dear [name],   Your remote transmission was recieved and reviewed by your physician.  All diagnostics were within normal limits for you.  __X___Your next transmission is scheduled for:   07-12-10.  Please transmit at any time this day.  If you have a wireless device your transmission will be sent automatically.   Sincerely,  [name]

## 2010-09-20 NOTE — Letter (Signed)
Summary: Device-Delinquent Phone Transmission  [facility], Main Office  [address]   [name], [place] [address]   Phone: [phone]  Fax: [phone]     July 16, 2010 MRN: [id number]   Idara Rodarte [address] [place], [place]  [address]   Dear Ms. Ruland,  According to our records, you were scheduled for a device phone transmission on 07-12-2010.     We did not receive any results from this check.  If you transmitted on your scheduled day, please call us to help troubleshoot your system.  If you forgot to send your transmission, please send one upon receipt of this letter.  Thank you,   [facility] 

## 2010-09-20 NOTE — Assessment & Plan Note (Signed)
Summary: COPD, cough   Visit Type:  Follow-up Copy to:  Nicholes Mango Primary Provider/Referring Provider:  Oneta Rack  CC:  5 month follow up.  states breathing is "not doing bad."  States she does have "a spell every now and then" when she is doing activity.  states she is still coughing-prod with a lot of yellow mucus..  History of Present Illness: Ms. Ninneman is a 77 year old woman with COPD that is in the mild to moderate range.  She is followed by Dr Gala Romney for congestive heart failure secondary to nonischemic cardiomyopathy.  Previous EF was 25-30%.  She is status post BiV ICD and her most recent EF was 60%. She also has diastolic dysfunction and a history of pulmonary edema.  Has OSA but unable to tolerate CPAP.   ROV 02/14/09 -- returns for f/u. Still with exertional dyspnea. Her cough and mucous are better since we treated her for bronchitis with azithromycin. Phlegm is now light yellow, less frequent. Sometimes getting choked up on saliva?,  has some chest wall pain from cough. Uses ventolin   ROV 06/12/09 -- returns for regular follow up of her mild to mod COPD, diastolic dysfxn and pulm HTN. Breathing has been fairly stable, has some bad days especially when it is humid. Tried CPAP for her OSA but unable to tolerate (returned it to the company). Uses Ventolin 2 - 3x a week. No exacerbations or hospitalizations since last visit. Still has episodes of coughing after taking by mouth, sometimes she gets choked up on her saliva.   ROV 11/08/09 -- returns for f/u. Unfortunately since last visit she had bowel obstruction and required bowel resection. She is getting her strength back. Continues to have daily cough, moreso than prior to her hospitalization. Taking Advair + Spiriva. She gets coughing spells after she uses Advair. Also still has symptoms suggestive of aspiration. Swallow eval showed evidence for food not  clearing. She is on an ARB, no ACE-I.  h Current Medications (verified): 1)   Synthroid 75 Mcg  Tabs (Levothyroxine Sodium) .... Take 1 Tablet By Mouth Once A Day 2)  Prilosec 20 Mg Cpdr (Omeprazole) .... Two Times A Day 3)  Advair Diskus 250-50 Mcg/dose  Misc (Fluticasone-Salmeterol) .... Inhale 1 Puff Two Times A Day 4)  Spiriva Handihaler 18 Mcg  Caps (Tiotropium Bromide Monohydrate) .... Inhale Contents of 1 Capsule Once Daily 5)  Ventolin Hfa 108 (90 Base) Mcg/act  Aers (Albuterol Sulfate) .[name].. 1-2 Puffs Every 4-6 Hours As Needed 6)  Benadryl 25 Mg  Tabs (Diphenhydramine Hcl) .... As Needed 7)  Diovan Hct 320-12.5 Mg Tabs (Valsartan-Hydrochlorothiazide) .... Once Daily 8)  Amlodipine Besylate 5 Mg Tabs (Amlodipine Besylate) .... Once Daily 9)  Coreg 12.5 Mg Tabs (Carvedilol) .... Take 1 Tablet By Mouth Two Times A Day 10)  Pravastatin Sodium 40 Mg Tabs (Pravastatin Sodium) .... Take 1 Tablet By Mouth Once A Day 11)  Align  Caps (Probiotic Product) .[name].. 1 By Mouth Daily 12)  Miralax  Powd (Polyethylene Glycol 3350) .... Once Daily 13)  Hyoscyamine Sulfate 0.125 Mg Tabs (Hyoscyamine Sulfate) .... As Needed  Allergies (verified): 1)  ! Lescol 2)  ! Vytorin 3)  ! Crestor 4)  ! Lopid 5)  ! Lipitor 6)  ! Ace Inhibitors 7)  ! Bisoprolol Fumarate (Bisoprolol Fumarate) 8)  ! * Tylenol With Codeine 9)  Lisinopril (Lisinopril) 10)  * Oxycotin  Vital Signs:  Patient profile:   77 year old female Height:  64 inches Weight:      127 pounds O2 Sat:      98 % on Room air Temp:     98.4 degrees F oral Pulse rate:   78 / minute BP sitting:   160 / 78  (right arm) Cuff size:   regular  Vitals Entered By: Gweneth Dimitri RN (November 08, 2009 2:42 PM)  O2 Flow:  Room air CC: 5 month follow up.  states breathing is "not doing bad."  States she does have "a spell every now and then" when she is doing activity.  states she is still coughing-prod with a lot of yellow mucus. Comments Medications reviewed with patient Daytime contact number verified with patient. Gweneth Dimitri RN  November 08, 2009 2:42 PM    Physical Exam  General:  normal appearance, healthy appearing, and thin.   Head:  normocephalic and atraumatic Nose:  some mild nasal congestion, no active gtt Mouth:  no deformity or lesions Neck:  no masses, thyromegaly, or abnormal cervical nodes Chest Wall:  no deformities noted Lungs:  scattered rhonchi, wheeze on forced expiration Heart:  regular rate and rhythm, S1, S2 without murmurs, rubs, gallops, or clicks Abdomen:  not examined Extremities:  no edema Skin:  intact without lesions or rashes Psych:  alert and cooperative; normal mood and affect; normal attention span and concentration   Impression & Recommendations:  Problem # 1:  COPD (ICD-496) No exacerbation at this time - Spiriva + Advair  Problem # 2:  COUGH (ICD-786.2)  Multifactorial. Probable components of COPD, aspiration/dysphagia, ? ADvair, ? her ARB - trial flutter valve 1 -2 x a day to see if we can help with secretion clearance, decrease freq of the cough - obtain swallow eval results  Orders: Est. Patient Level IV ([address]109)  Medications Added to Medication List This Visit: 1)  Miralax Powd (Polyethylene glycol 3350) .... Once daily  Patient Instructions: 1)  Continue your Spiriva and Advair. 2)  Start using a flutter valve 1 -2 x a day to see if this helps your cough.  3)  Follow up with Dr Delton Coombes in 4 months or as needed.

## 2010-09-20 NOTE — Progress Notes (Signed)
Summary: results of blood test  Phone Note Call from Patient   Caller: Patient [phone] Reason for Call: Talk to Nurse, Lab or Test Results Summary of Call: pt calling to get results of blood test done last week-pls call [phone] Initial call taken by: Glynda Jaeger,  March 19, 2010 12:16 PM  Follow-up for Phone Call        pt aware of bmet results Meredith Staggers, RN  March 19, 2010 12:28 PM

## 2010-09-20 NOTE — Letter (Signed)
Summary: [facility] Office Note  [facility] Office Note   Imported By: Roderic Ovens 10/27/2009 16:20:49  _____________________________________________________________________  External Attachment:    Type:   Image     Comment:   External Document

## 2010-09-20 NOTE — Letter (Signed)
Summary: Dr Mikey Bussing ingram's Office Note  Dr Mikey Bussing ingram's Office Note   Imported By: Roderic Ovens 01/18/2010 11:01:37  _____________________________________________________________________  External Attachment:    Type:   Image     Comment:   External Document

## 2010-09-20 NOTE — Letter (Signed)
Summary: [facility] Clearance   Imported By: Roderic Ovens 03/05/2010 13:13:37  _____________________________________________________________________  External Attachment:    Type:   Image     Comment:   External Document

## 2010-09-20 NOTE — Procedures (Signed)
Summary: [facility]   Current Medications (verified): 1)  Synthroid 75 Mcg  Tabs (Levothyroxine Sodium) .... Take 1 Tablet By Mouth Once A Day 2)  Prilosec 20 Mg Cpdr (Omeprazole) .... Two Times A Day 3)  Spiriva Handihaler 18 Mcg  Caps (Tiotropium Bromide Monohydrate) .... Inhale Contents of 1 Capsule Once Daily 4)  Ventolin Hfa 108 (90 Base) Mcg/act  Aers (Albuterol Sulfate) .[name].. 1-2 Puffs Every 4-6 Hours As Needed 5)  Benadryl 25 Mg  Tabs (Diphenhydramine Hcl) .... As Needed 6)  Diovan Hct 160-12.5 Mg Tabs (Valsartan-Hydrochlorothiazide) .... Take 1 Tablet By Mouth Once A Day 7)  Amlodipine Besylate 5 Mg Tabs (Amlodipine Besylate) .... Once Daily 8)  Coreg 12.5 Mg Tabs (Carvedilol) .... Take 1 Tablet By Mouth Two Times A Day 9)  Miralax  Powd (Polyethylene Glycol 3350) .... Once Daily 10)  Fenofibrate Micronized 134 Mg Caps (Fenofibrate Micronized) .[name].. 1 By Mouth Daily 11)  Hydrochlorothiazide 25 Mg Tabs (Hydrochlorothiazide) .[name].. 1 By Mouth Daily 12)  Amoxicillin 250 Mg Caps (Amoxicillin) .[name].. 1 By Mouth Three Times A Day For Uti For 2 Weeks 13)  Zofran Odt 8 Mg Tbdp (Ondansetron) .[name].. 1 By Mouth Daily As Needed For Nausea 14)  Fluticasone Propionate 50 Mcg/act Susp (Fluticasone Propionate) .... 2 Sprays Each Nostril Two Times A Day 15)  Diltiazem Hcl Er Beads 240 Mg Xr24h-Cap (Diltiazem Hcl Er Beads) .... Take One Capsule Once Daily  Allergies (verified): 1)  ! Lescol 2)  ! Vytorin 3)  ! Crestor 4)  ! Lopid 5)  ! Lipitor 6)  ! Ace Inhibitors 7)  ! Bisoprolol Fumarate (Bisoprolol Fumarate) 8)  ! * Tylenol With Codeine 9)  Lisinopril (Lisinopril) 10)  * Oxycotin   ICD Specifications Following MD:  Sherryl Manges, MD     ICD Vendor:  Medtronic     ICD Model Number:  [id number]     ICD Serial Number:  [id number] ICD DOI:  11/16/2004     ICD Implanting MD:  Sherryl Manges, MD  Lead 1:    Location: RA     DOI: 11/16/2004     Model #: [id number]     Serial #: [id number]     Status:  active Lead 2:    Location: RV     DOI: 11/16/2004     Model #: 2841     Serial #: [id number]     Status: active Lead 3:    Location: LV     DOI: 11/16/2004     Model #: 2536     Serial #: [id number]     Status: active  Indications::  NICM, CHF   ICD Follow Up Battery Voltage:  2.65 V     Charge Time:  10.36 seconds     Underlying rhythm:  SR ICD Dependent:  No       ICD Device Measurements Atrium:  Amplitude: 4.3 mV, Impedance: 464 ohms, Threshold: 1.0 V at 0.2 msec Right Ventricle:  Amplitude: 10.6 mV, Impedance: 720 ohms, Threshold: 1.0 V at 0.2 msec Left Ventricle:  Impedance: 688 ohms, Threshold: 2.00 V at 0.7 msec Configuration: BIPOLAR Shock Impedance: 56/80 ohms   Episodes MS Episodes:  5     Coumadin:  No Shock:  0     ATP:  0     Nonsustained:  0     Atrial Therapies:  0 Atrial Pacing:  <0.1%     Ventricular Pacing:  99.9%  Brady Parameters Mode DDD  Lower Rate Limit:  50     Upper Rate Limit 130 PAV 110     Sensed AV Delay:  80  Tachy Zones VF:  200     VT:  240 FVT VIA VF     VT1:  171     Next Cardiology Appt Due:  04/16/2010 Tech Comments:  BATTERY VOLTAGE 2.65 (ERI 2.62)--MONTHLY CARELINK CHECKS.  DEMONSTRATED TONE AND PT UNABLE TO HEAR. LV THRESHOLD 2.0 @ 0.51ms--CHANGED LV OUTPUT TO 3.0 V.  CHANGED MAX LEAD IMPEDANCE FOR LIA.  CARELINK CHECK 04-16-10. [name]  March 14, 2010 4:51 PM

## 2010-09-20 NOTE — Letter (Signed)
Summary: Remote Device Check  [facility], Main Office  [address]   [place], [place] [address]   Phone: [phone]  Fax: [phone]     October 31, 2009 MRN: [id number]   NIKITIA ASBILL [address] [place], [place]  [address]   Dear [name],   Your remote transmission was recieved and reviewed by your physician.  All diagnostics were within normal limits for you.   ___X___Your next office visit is scheduled for:   JUNE 2011 WITH DR Graciela Husbands. Please call our office to schedule an appointment.    Sincerely,  Proofreader

## 2010-09-20 NOTE — Progress Notes (Signed)
  Phone Note From Other Clinic   Caller: becka Call For: [facility] Internal Medicine Details for Reason: Hubert Azure Summary of Call: Becka/Internal Medicine adv they see changes in EKG and pt c/o CP 5/10 (was 10/10 this past weekend), shob, and weakness. Pain is sharp mid sternal and l. lateral per Lewanda Rife. Showed EKG (V-paced) to Dr. Ladona Ridgel and no changes from December and adv pt to seek emergency care. Christen Butter of info.  Initial call taken by: Claris Gladden RN,  August 28, 2010 4:21 PM

## 2010-09-20 NOTE — Progress Notes (Signed)
Summary: chest pain /sob  Phone Note Call from Patient Call back at [facility] Phone [phone]   Caller: Patient Reason for Call: Talk to Nurse Summary of Call: pt feel weak and shaky. chest pain and sob. Initial call taken by: Roe Coombs,  July 20, 2010 12:08 PM  Follow-up for Phone Call        pt states she was given Prednisone for a problem with her hip--she took the last dose on Tuesday after being on Prednisone for a couple of weeks--she is complaining of SOB starting Wednesday--pt denies edema,pt states she weighs daily and her weight has actually  decreased by 1 pound since Wednesday,  she does not have orthopnea, pt denies feeling bloated--she states her SOB really hasn't changed acutely over the last few days--she states she has been coughing up yellow phlegm and feels congested in her chest-she states she does feel shakey at times and attributes this to stopping Prednisone Tuesday and her glucose has been a little out of control and eating a cracker can help--she was going to get in touch with Dr Reed Breech PCP--she is aware if acute changes in her SOB or weight  to call back or report to ER

## 2010-09-20 NOTE — Assessment & Plan Note (Signed)
Summary: eri icd-m   Visit Type:  ICD-Medtronic Referring Provider:  Nicholes [name] Primary Provider:  Oneta [name]  CC:  shortness of breath, CP, headaches, dizziness, and swelling.  History of Present Illness: [name] is seen at her request today becasue of worsening sob assoic with some edema occuring before  and then further hand swelling which developed aftr steroid therapy for hip pain.  She is  a 77 year old woman with a history of congestive heart failure, secondary nonischemic cardiomyopathy, previous EF was 25-30%.  She is status post BIV/ICD.  Most recent ejection fraction was 60%by echo last spring.     She also has a history of COPD with chronic dyspnea, hypertension, hyperlipidemia and chronic renal insufficiency.    She underwent colostomy for a colonic obstruction in January 2011. Underwent takedown with extensive lysis of adhesions in May.  A few weeks ago was having edema and hctz added to her Diovan.  During the summer she has some problems with edema treated with Hydrchlor thiazide  Problems Prior to Update: 1)  Cardiomyopathy, Secondary  (ICD-425.9) 2)  6949 Lead  (ICD-996.04) 3)  Implantation of Defibrillator, Medtronic Maximo 730x  (ICD-V45.02) 4)  Congestive Heart Failure  (ICD-428.0) 5)  Hypertension  (ICD-401.9) 6)  Pulmonary Hypertension, Secondary  (ICD-416.8) 7)  Dsord Sleep Related Hypovnt/hypoxemia Cce  (ICD-327.26) 8)  Hypothyroidism  (ICD-244.9) 9)  COPD  (ICD-496) 10)  Cough  (ICD-786.2)  Current Medications (verified): 1)  Synthroid 75 Mcg  Tabs (Levothyroxine Sodium) .... Take 1 Tablet By Mouth Once A Day 2)  Prilosec 20 Mg Cpdr (Omeprazole) .... Two Times A Day 3)  Spiriva Handihaler 18 Mcg  Caps (Tiotropium Bromide Monohydrate) .... Inhale Contents of 1 Capsule Once Daily 4)  Ventolin Hfa 108 (90 Base) Mcg/act  Aers (Albuterol Sulfate) .[name].. 1-2 Puffs Every 4-6 Hours As Needed 5)  Benadryl 25 Mg  Tabs (Diphenhydramine Hcl) .... As Needed 6)   Diovan Hct 160-12.5 Mg Tabs (Valsartan-Hydrochlorothiazide) .... Take 1 Tablet By Mouth Once A Day 7)  Amlodipine Besylate 5 Mg Tabs (Amlodipine Besylate) .... Once Daily 8)  Coreg 12.5 Mg Tabs (Carvedilol) .... Take 1 Tablet By Mouth Two Times A Day 9)  Miralax  Powd (Polyethylene Glycol 3350) .... Once Daily 10)  Fenofibrate Micronized 134 Mg Caps (Fenofibrate Micronized) .[name].. 1 By Mouth Every Other Day 11)  Hydrochlorothiazide 25 Mg Tabs (Hydrochlorothiazide) .[name].. 1 By Mouth Daily 12)  Zofran Odt 8 Mg Tbdp (Ondansetron) .[name].. 1 By Mouth Daily As Needed For Nausea 13)  Fluticasone Propionate 50 Mcg/act Susp (Fluticasone Propionate) .... 2 Sprays Each Nostril At Bedtime 14)  Diltiazem Hcl Er Beads 240 Mg Xr24h-Cap (Diltiazem Hcl Er Beads) .... Take One Capsule Once Daily  Allergies (verified): 1)  ! Lescol 2)  ! Vytorin 3)  ! Crestor 4)  ! Lopid 5)  ! Lipitor 6)  ! Ace Inhibitors 7)  ! Bisoprolol Fumarate (Bisoprolol Fumarate) 8)  ! * Tylenol With Codeine 9)  Lisinopril (Lisinopril) 10)  * Oxycotin  Past History:  Past Medical History: Last updated: 07/31/2009 Congestive heart failure due to NICM     --EF previoulsy 25%    --ECHO 5/10 60-65%    --s/p ICD--Medtronic Maximo 730x COPD with chronic dyspnea Hypertension Hypothyroidism  Past Surgical History: Last updated: 01/23/2009 appendectomy hysterectomy bone fusion growth removed from throat cyst off elbow polyps in bladder removed AICD-medtronic maximo  Family History: Last updated: 01/11/2009 heart disease- mother cancer- mother breast cancer and brain tumor  Social  History: Last updated: 01/23/2009 married and lives with husband children retired occupation- Event organiser and cats outside Tobacco Use - Former. quit 1998 Alcohol Use - no Drug Use - no  Risk Factors: Smoking Status: quit (01/23/2009)  Vital Signs:  Patient profile:   77 year old female Height:      64 inches Weight:      129.50  pounds BMI:     22.31 Pulse rate:   92 / minute BP sitting:   138 / 88  (left arm) Cuff size:   regular  Vitals Entered By: [name] CMA (July 31, 2010 1:57 PM)  Physical Exam  General:  Well developed, well nourished, in no acute distress. Head:  Normal HEENT Neck:  supple without thyromegaly or lymphadenopathyphysical JVP 5-6 cm Lungs:  clear to auscultation Heart:  regular rate and rhythm without significant murmurs or gallops; Abdomen:  soft nontender without hepatomegaly negative HJR Pulses:  warm and dry Extremities:  without clubbing cyanosis or edema Neurologic:  alert and oriented x3 with grossly normal motor and sensory function   EKG  Procedure date:  07/31/2010  Findings:      P. synchronous pacing with evidence of biventricular capture   ICD Specifications Following MD:  Amy Manges, MD     ICD Vendor:  Medtronic     ICD Model Number:  7304     ICD Serial Number:  [id number] ICD DOI:  11/16/2004     ICD Implanting MD:  Amy Manges, MD  Lead 1:    Location: RA     DOI: 11/16/2004     Model #: 4098     Serial #: [id number]     Status: active Lead 2:    Location: RV     DOI: 11/16/2004     Model #: 5621     Serial #: [id number]     Status: active Lead 3:    Location: LV     DOI: 11/16/2004     Model #: 9629     Serial #: [id number]     Status: active  Indications::  NICM, CHF   ICD Follow Up ICD Dependent:  No       ICD Device Measurements Configuration: BIPOLAR  Episodes Coumadin:  No  Brady Parameters Mode DDD     Lower Rate Limit:  50     Upper Rate Limit 130 PAV 110     Sensed AV Delay:  80  Tachy Zones VF:  200     VT:  240 FVT VIA VF     VT1:  171     Impression & Recommendations:  Problem # 1:  IMPLANTATION OF DEFIBRILLATOR, MEDTRONIC MAXIMO 730X (ICD-V45.02) Her defibrillator has reached ER eye. She has a CRT device; last echo assessment in the spring had showed normalization of LV systolic function. She's had problems with heart  failure-like symptoms of late. In the event that her LVEF however remains normal, we will plan to replace her CRT D. with a CRT P. device. We have discussed potential for risks including infection and lead fracture.  Problem # 2:  [id number] LEAD (ICD-996.04) she has a 6949 leading place which will require revision. In the event that she receives a CRT P. device we'll simply changed it 4194 bipolar LV lead with the rate sense portion of her defibrillator lead and High-voltage coils. In the event that she needs an ICD, she'll need a new ICD lead  Problem # 3:  CONGESTIVE HEART  FAILURE (ICD-428.0) she has evidence by symptoms of aggravation of her congestive symptoms; we will obtain laboratories from Dr. Jilda Roche office from yesterday. This could also be related to her COPD. There is no evidence of volume overload on examination. Her updated medication list for this problem includes:    Diovan Hct 160-12.5 Mg Tabs (Valsartan-hydrochlorothiazide) .[name]... Take 1 tablet by mouth once a day    Amlodipine Besylate 5 Mg Tabs (Amlodipine besylate) ..... Once daily    Coreg 12.5 Mg Tabs (Carvedilol) .[name]... Take 1 tablet by mouth two times a day    Hydrochlorothiazide 25 Mg Tabs (Hydrochlorothiazide) .[name]... 1 by mouth daily    Diltiazem Hcl Er Beads 240 Mg Xr24h-cap (Diltiazem hcl er beads) .[name]... Take one capsule once daily  Orders: TLB-BNP (B-Natriuretic Peptide) (83880-BNPR) Echocardiogram (Echo)  Problem # 4:  CARDIOMYOPATHY, SECONDARY (ICD-425.9) as above; we will need to repeat an echo Her updated medication list for this problem includes:    Diovan Hct 160-12.5 Mg Tabs (Valsartan-hydrochlorothiazide) .[name]... Take 1 tablet by mouth once a day    Amlodipine Besylate 5 Mg Tabs (Amlodipine besylate) ..... Once daily    Coreg 12.5 Mg Tabs (Carvedilol) .[name]... Take 1 tablet by mouth two times a day    Hydrochlorothiazide 25 Mg Tabs (Hydrochlorothiazide) .[name]... 1 by mouth daily    Diltiazem Hcl Er Beads 240 Mg  Xr24h-cap (Diltiazem hcl er beads) .[name]... Take one capsule once daily  Orders: TLB-BNP (B-Natriuretic Peptide) (83880-BNPR) Echocardiogram (Echo)  Patient Instructions: 1)  Your physician recommends that you have lab work today. 2)  Your physician recommends that you continue on your current medications as directed. Please refer to the Current Medication list given to you today. 3)  Your physician has requested that you have an echocardiogram.  Echocardiography is a painless test that uses sound waves to create images of your heart. It provides your doctor with information about the size and shape of your heart and how well your heart's chambers and valves are working.  This procedure takes approximately one hour. There are no restrictions for this procedure.

## 2010-09-26 NOTE — Medication Information (Signed)
Summary: Lab Orders  Lab Orders   Imported By: Marylou Mccoy 09/19/2010 11:34:15  _____________________________________________________________________  External Attachment:    Type:   Image     Comment:   External Document

## 2010-10-04 NOTE — Procedures (Signed)
Summary: wch. gd   Current Medications (verified): 1)  Synthroid 75 Mcg  Tabs (Levothyroxine Sodium) .... Take 1 Tablet By Mouth Once A Day 2)  Prilosec 20 Mg Cpdr (Omeprazole) .... Two Times A Day 3)  Spiriva Handihaler 18 Mcg  Caps (Tiotropium Bromide Monohydrate) .... Inhale Contents of 1 Capsule Once Daily 4)  Ventolin Hfa 108 (90 Base) Mcg/act  Aers (Albuterol Sulfate) .[name].. 1-2 Puffs Every 4-6 Hours As Needed 5)  Benadryl 25 Mg  Tabs (Diphenhydramine Hcl) .... As Needed 6)  Diovan Hct 160-12.5 Mg Tabs (Valsartan-Hydrochlorothiazide) .... Take 1 Tablet By Mouth Once A Day 7)  Miralax  Powd (Polyethylene Glycol 3350) .... Once Daily 8)  Fenofibrate Micronized 134 Mg Caps (Fenofibrate Micronized) .[name].. 1 By Mouth Every Other Day 9)  Zofran Odt 8 Mg Tbdp (Ondansetron) .[name].. 1 By Mouth Daily As Needed For Nausea 10)  Fluticasone Propionate 50 Mcg/act Susp (Fluticasone Propionate) .... 2 Sprays Each Nostril At Bedtime 11)  Diltiazem Hcl Er Beads 240 Mg Xr24h-Cap (Diltiazem Hcl Er Beads) .... Take One Capsule Once Daily 12)  Levofloxacin 500 Mg Tabs (Levofloxacin) .... One By Mouth Daily  Allergies (verified): 1)  ! Lescol 2)  ! Vytorin 3)  ! Crestor 4)  ! Lopid 5)  ! Lipitor 6)  ! Ace Inhibitors 7)  ! Bisoprolol Fumarate (Bisoprolol Fumarate) 8)  ! * Tylenol With Codeine 9)  Lisinopril (Lisinopril) 10)  * Oxycotin  PPM Specifications Following MD:  Sherryl Manges, MD     PPM Vendor:  Medtronic     PPM Model Number:  [id number]     PPM Serial Number:  [id number] PPM DOI:  09/03/2010     PPM Implanting MD:  Sherryl Manges, MD  Lead 1    Location: RA     DOI: 11/16/2004     Model #: [id number]     Serial #: [id number]     Status: active Lead 2    Location: RV     DOI: 11/16/2004     Model #: [id number]     Serial #: [id number]     Status: active Lead 3    Location: LV     DOI: 11/16/2004     Model #: [id number]     Serial #: [id number]     Status: active  Magnet Response Rate:  BOL 85 ERI 65  Indications:   NICM   PPM Follow Up Battery Voltage:  3.05 V     Battery Est. Longevity:  4 yrs       PPM Device Measurements Atrium  Amplitude: 3.9 mV, Impedance: 437 ohms, Threshold: 0.50 V at 0.40 msec Right Ventricle  Amplitude: 8.3 mV, Impedance: 646 ohms, Threshold: 1.75 V at 0.40 msec Left Ventricle  Impedance: 817 ohms, Threshold: 0.50 V at 0.40 msec  Episodes MS Episodes:  0     Ventricular High Rate:  0     Atrial Pacing:  0.1%     Ventricular Pacing:  99.9%  Parameters Mode:  DDD     Lower Rate Limit:  50     Upper Rate Limit:  130 Paced AV Delay:  110     Sensed AV Delay:  100 Next Cardiology Appt Due:  11/19/2010 Tech Comments:  WOUND CHECK---STERI STRIPS REMOVED. NO REDNESS OR SWELLING AT SITE. NORMAL DEVICE FUNCTION. RV THRESHOLD ON TODAYS CHECK 1.75 @ 0.40 AND LAST CHECK WAS 1.0.  CHANGED RV OUTPUT FROM 4.75 TO 3.50 V.  ROV IN 3 MTHS W/SK. Belenda Cruise  [place]  September 17, 2010 9:30 AM  ICD Specifications Following MD:  Sherryl Manges, MD     ICD Vendor:  Medtronic     ICD Model Number:  7304     ICD Serial Number:  [id number] ICD DOI:  11/16/2004     ICD Implanting MD:  Sherryl Manges, MD  Lead 1:    Location: RA     DOI: 11/16/2004     Model #: [id number]     Serial #: [id number]     Status: active Lead 2:    Location: RV     DOI: 11/16/2004     Model #: [id number]     Serial #: [id number]     Status: active Lead 3:    Location: LV     DOI: 11/16/2004     Model #: [id number]     Serial #: [id number]     Status: active  Indications::  NICM, CHF  Explantation Comments: 09/03/2010  Medtronic Maximo [id number] H explanted  ICD Follow Up ICD Dependent:  No       ICD Device Measurements Configuration: BIPOLAR  Episodes Coumadin:  No  Brady Parameters Mode DDD     Lower Rate Limit:  50     Upper Rate Limit 130 PAV 110     Sensed AV Delay:  80  Tachy Zones VF:  200     VT:  240 FVT VIA VF     VT1:  171

## 2010-10-05 ENCOUNTER — Encounter: Payer: Self-pay | Admitting: Emergency Medicine

## 2010-10-05 ENCOUNTER — Ambulatory Visit (INDEPENDENT_AMBULATORY_CARE_PROVIDER_SITE_OTHER): Payer: Medicare Other | Admitting: Emergency Medicine

## 2010-10-05 DIAGNOSIS — R05 Cough: Secondary | ICD-10-CM

## 2010-10-05 DIAGNOSIS — J449 Chronic obstructive pulmonary disease, unspecified: Secondary | ICD-10-CM

## 2010-10-10 NOTE — Assessment & Plan Note (Signed)
Summary: COPD, dyspnrea, cough   Visit Type:  Follow-up Copy to:  Nicholes Mango Primary Provider/Referring Provider:  Oneta Rack  CC:  Follow up, pt c/o prod cough yellow, increase sob , pt had new pace maker in January feels her symptons are worse since that procedure, and uses rescue inhaler  3-4 times a day.  History of Present Illness: [name] is a 77 year old woman with COPD that is in the mild to moderate range.  She is followed by Dr Gala Romney for congestive heart failure secondary to nonischemic cardiomyopathy.  Previous EF was 25-30%.  She is status post BiV ICD, EF now 60%. She also has diastolic dysfunction and a history of pulmonary edema.  Has OSA but unable to tolerate CPAP. Pacer changed   ROV 03/08/10 -- returns for her COPD, systolic/diastolic CHF, OSA. Since last visit she had reversal of her colostomy, still doesn't have her strength back. She still has cough. Has been using flutter valve, has been trying to practice swallowing precautions. She coughs whenever she takes her Advair. Seems to tolerate the Spiriva. She has had some increased LE edema, currently seems to be at baseline.   ROV 05/14/10 -- 77 yo woman, moderate COPD, NICM s/p BiV pacer, OSA (not on CPAP). Last time we discussed her cough, stopped Advair and continued Spiriva. Also started fluticasone nasal spray at bedtime. Her cough is better. Hasn't really missed the Advair, breathing is stable.   ROV 10/05/10 -- regular f/u for COPD, also hx systolic and diastolic CHF s/p BiV pacer, OSA not on CPAP. Just had BiV pacer changed on 09/03/10. She returns telling me that her SOB has slowly progressed over last 6 - 12 months. Not able to walk as far, not able to cook or clean. She has been coughing daily, not always productive. Hears wheeze at night and sometimes during the day, better with coughing up phlegm. Currently on Spiriva, stopped Advair. Not taking fluticasone spray. Hasn't tolerated mucinex before.   Preventive  Screening-Counseling & Management  Alcohol-Tobacco     Packs/Day: 2.0     Year Started: 1951     Year Quit: 1994  Current Medications (verified): 1)  Synthroid 75 Mcg  Tabs (Levothyroxine Sodium) .... Take 1 Tablet By Mouth Once A Day 2)  Prilosec 20 Mg Cpdr (Omeprazole) .... Two Times A Day 3)  Spiriva Handihaler 18 Mcg  Caps (Tiotropium Bromide Monohydrate) .... Inhale Contents of 1 Capsule Once Daily 4)  Ventolin Hfa 108 (90 Base) Mcg/act  Aers (Albuterol Sulfate) .[name].. 1-2 Puffs Every 4-6 Hours As Needed 5)  Benadryl 25 Mg  Tabs (Diphenhydramine Hcl) .... As Needed 6)  Diovan Hct 160-12.5 Mg Tabs (Valsartan-Hydrochlorothiazide) .... Take 1 Tablet By Mouth Once A Day 7)  Miralax  Powd (Polyethylene Glycol 3350) .... Once Daily 8)  Fenofibrate Micronized 134 Mg Caps (Fenofibrate Micronized) .[name].. 1 By Mouth Every Other Day  Allergies (verified): 1)  ! Lescol 2)  ! Vytorin 3)  ! Crestor 4)  ! Lopid 5)  ! Lipitor 6)  ! Ace Inhibitors 7)  ! Bisoprolol Fumarate (Bisoprolol Fumarate) 8)  ! * Tylenol With Codeine 9)  Lisinopril (Lisinopril) 10)  * Oxycotin  Social History: Packs/Day:  2.0  Vital Signs:  Patient profile:   77 year old female Height:      64 inches Weight:      131.2 pounds O2 Sat:      97 % on Room air Temp:     98.2 degrees F oral  Pulse rate:   80 / minute BP sitting:   138 / 84  (left arm)  Vitals Entered By: Renold Genta RCP, LPN (October 05, 2010 9:22 AM)  O2 Flow:  Room air  Serial Vital Signs/Assessments:  Comments: Ambulatory Pulse Oximetry  Resting; HR_80____    02 Sat_96%RA____  Lap1 (185 feet)   HR_90____   02 Sat_92%RA____ Lap2 (185 feet)   HR_____   02 Sat_____    Lap3 (185 feet)   HR_____   02 Sat_____  ___Test Completed without Difficulty _X__Test Stopped due to:Pt c/o dizziness and leg weakness, Dr. Delton Coombes aware Renold Genta RCP, LPN  October 05, 2010 12:12 PM    By: Renold Genta RCP, LPN   CC: Follow up, pt c/o prod  cough yellow, increase sob , pt had new pace maker in January feels her symptons are worse since that procedure, uses rescue inhaler  3-4 times a day Comments Medications reviewed with patient Renold Genta RCP, LPN  October 05, 2010 9:28 AM    Physical Exam  General:  normal appearance, healthy appearing, and thin.   Head:  normocephalic and atraumatic Nose:  some mild nasal congestion, no active gtt Mouth:  no deformity or lesions Neck:  no masses, thyromegaly, or abnormal cervical nodes Chest Wall:  no deformities noted Lungs:  scattered rhonchi, wheeze on forced expiration and with cough Heart:  regular rate and rhythm, S1, S2 without murmurs, rubs, gallops, or clicks Abdomen:  not examined Extremities:  no edema Skin:  intact without lesions or rashes Psych:  alert and cooperative; normal mood and affect; normal attention span and concentration   Impression & Recommendations:  Problem # 1:  COPD (ICD-496) Almost certainly contributing to her dyspnea, although no desat on ambulation today - want to maximize her COPD therapy, add Symbicort to spiriva to day.  - will repeat walk next time, expect she will qualify for O2 if she can walk far enough - ROV 1 month  Problem # 2:  PULMONARY HYPERTENSION, SECONDARY (ICD-416.8)  Problem # 3:  CARDIOMYOPATHY, SECONDARY (ICD-425.9)  Problem # 4:  COUGH (ICD-786.2) - add back fluticasone nasal spray  Medications Added to Medication List This Visit: 1)  Symbicort 160-4.5 Mcg/act Aero (Budesonide-formoterol fumarate) .... 2 puffs two times a day 2)  Fluticasone Propionate 50 Mcg/act Susp (Fluticasone propionate) .... 2 sprays each nostril two times a day  Other Orders: Est. Patient Level IV (27253)  Patient Instructions: 1)  Continue Spiriva once daily  2)  Walking oximetry today 3)  We will start Symbicort 160.4.21mcg 2 puffs two times a day  4)  Start fluticasone nasal spray, 2 sprays each nostril two times a day  5)  Follow  up with Dr Delton Coombes in 1 month to review your status.  Prescriptions: FLUTICASONE PROPIONATE 50 MCG/ACT SUSP (FLUTICASONE PROPIONATE) 2 sprays each nostril two times a day  #1 x 11   Entered and Authorized by:   Leslye Peer MD   Signed by:   Leslye Peer MD on 10/05/2010   Method used:   Electronically to        [facility]  [address] # [id number]* (retail)       [address]       [place], [place]  [address]       Ph: [phone] or [phone]       Fax: [phone]   RxID:   [id number] SYMBICORT 160-4.5 MCG/ACT AERO (  BUDESONIDE-FORMOTEROL FUMARATE) 2 puffs two times a day  #1 x 11   Entered and Authorized by:   Leslye Peer MD   Signed by:   Leslye Peer MD on 10/05/2010   Method used:   Electronically to        [facility]  [address] [id number]* (retail)       [address]       [place], [place]  [address]       Ph: [phone] or [phone]       Fax: [phone]   RxID:   [id number]

## 2010-10-16 NOTE — Letter (Signed)
Summary: Amy Daniels MD  Amy Daniels MD   Imported By: Lester McIntosh 10/10/2010 10:20:21  _____________________________________________________________________  External Attachment:    Type:   Image     Comment:   External Document

## 2010-10-16 NOTE — Letter (Signed)
Summary: Marinus Maw MD  Marinus Maw MD   Imported By: Lester Lakeview 10/10/2010 10:21:24  _____________________________________________________________________  External Attachment:    Type:   Image     Comment:   External Document

## 2010-10-25 NOTE — Cardiovascular Report (Signed)
Summary: Confidential Patient Implant Record Information   Confidential Patient Implant Record Information   Imported By: Roderic Ovens 10/15/2010 15:43:20  _____________________________________________________________________  External Attachment:    Type:   Image     Comment:   External Document

## 2010-10-25 NOTE — Cardiovascular Report (Signed)
Summary: Office Visit   Office Visit   Imported By: Roderic Ovens 10/15/2010 15:42:37  _____________________________________________________________________  External Attachment:    Type:   Image     Comment:   External Document

## 2010-11-04 LAB — COMPREHENSIVE METABOLIC PANEL
ALT: 12 U/L (ref 0–35)
ALT: 21 U/L (ref 0–35)
ALT: 12 U/L (ref 0–35)
ALT: 13 U/L (ref 0–35)
ALT: 15 U/L (ref 0–35)
AST: 16 U/L (ref 0–37)
AST: 19 U/L (ref 0–37)
AST: 15 U/L (ref 0–37)
Albumin: 2.7 g/dL — ABNORMAL LOW (ref 3.5–5.2)
Albumin: 3.6 g/dL (ref 3.5–5.2)
Albumin: 2.1 g/dL — ABNORMAL LOW (ref 3.5–5.2)
Albumin: 3 g/dL — ABNORMAL LOW (ref 3.5–5.2)
Albumin: 3.7 g/dL (ref 3.5–5.2)
Alkaline Phosphatase: 55 U/L (ref 39–117)
Alkaline Phosphatase: 55 U/L (ref 39–117)
Alkaline Phosphatase: 66 U/L (ref 39–117)
Alkaline Phosphatase: 43 U/L (ref 39–117)
Alkaline Phosphatase: 47 U/L (ref 39–117)
BUN: 4 mg/dL — ABNORMAL LOW (ref 6–23)
BUN: 4 mg/dL — ABNORMAL LOW (ref 6–23)
BUN: 10 mg/dL (ref 6–23)
BUN: 3 mg/dL — ABNORMAL LOW (ref 6–23)
CO2: 23 mEq/L (ref 19–32)
CO2: 26 mEq/L (ref 19–32)
CO2: 25 mEq/L (ref 19–32)
CO2: 28 mEq/L (ref 19–32)
Calcium: 7.7 mg/dL — ABNORMAL LOW (ref 8.4–10.5)
Calcium: 7.9 mg/dL — ABNORMAL LOW (ref 8.4–10.5)
Chloride: 105 mEq/L (ref 96–112)
Chloride: 108 mEq/L (ref 96–112)
Chloride: 109 mEq/L (ref 96–112)
Chloride: 106 mEq/L (ref 96–112)
Creatinine, Ser: 0.91 mg/dL (ref 0.4–1.2)
Creatinine, Ser: 0.91 mg/dL (ref 0.4–1.2)
GFR calc Af Amer: 59 mL/min — ABNORMAL LOW (ref >60)
GFR calc Af Amer: >60 mL/min (ref >60)
GFR calc Af Amer: >60 mL/min (ref >60)
GFR calc non Af Amer: >60 mL/min (ref >60)
GFR calc non Af Amer: 60 mL/min — ABNORMAL LOW (ref >60)
GFR calc non Af Amer: 60 mL/min — ABNORMAL LOW (ref >60)
GFR calc non Af Amer: >60 mL/min (ref >60)
Glucose, Bld: 86 mg/dL (ref 70–99)
Glucose, Bld: 108 mg/dL — ABNORMAL HIGH (ref 70–99)
Glucose, Bld: 79 mg/dL (ref 70–99)
Glucose, Bld: 84 mg/dL (ref 70–99)
Potassium: 3.3 mEq/L — ABNORMAL LOW (ref 3.5–5.1)
Potassium: 3.8 mEq/L (ref 3.5–5.1)
Potassium: 3.9 mEq/L (ref 3.5–5.1)
Potassium: 2.9 mEq/L — ABNORMAL LOW (ref 3.5–5.1)
Sodium: 138 mEq/L (ref 135–145)
Sodium: 140 mEq/L (ref 135–145)
Sodium: 138 mEq/L (ref 135–145)
Sodium: 139 mEq/L (ref 135–145)
Sodium: 140 mEq/L (ref 135–145)
Total Bilirubin: 0.2 mg/dL — ABNORMAL LOW (ref 0.3–1.2)
Total Bilirubin: 0.3 mg/dL (ref 0.3–1.2)
Total Bilirubin: 0.6 mg/dL (ref 0.3–1.2)
Total Protein: 5.4 g/dL — ABNORMAL LOW (ref 6.0–8.3)
Total Protein: 5.6 g/dL — ABNORMAL LOW (ref 6.0–8.3)
Total Protein: 4.9 g/dL — ABNORMAL LOW (ref 6.0–8.3)
Total Protein: 6.8 g/dL (ref 6.0–8.3)

## 2010-11-04 LAB — DIFFERENTIAL
Basophils Absolute: 0 10*3/uL (ref 0.0–0.1)
Basophils Absolute: 0.1 10*3/uL (ref 0.0–0.1)
Basophils Relative: 0 % (ref 0–1)
Basophils Relative: 1 % (ref 0–1)
Basophils Relative: 1 % (ref 0–1)
Basophils Relative: 0 % (ref 0–1)
Basophils Relative: 1 % (ref 0–1)
Eosinophils Absolute: 0.1 10*3/uL (ref 0.0–0.7)
Eosinophils Absolute: 0.2 10*3/uL (ref 0.0–0.7)
Eosinophils Absolute: 0 10*3/uL (ref 0.0–0.7)
Eosinophils Absolute: 0.4 10*3/uL (ref 0.0–0.7)
Eosinophils Absolute: 0.5 10*3/uL (ref 0.0–0.7)
Eosinophils Relative: 1 % (ref 0–5)
Eosinophils Relative: 2 % (ref 0–5)
Eosinophils Relative: 6 % — ABNORMAL HIGH (ref 0–5)
Lymphocytes Relative: 25 % (ref 12–46)
Lymphocytes Relative: 9 % — ABNORMAL LOW (ref 12–46)
Lymphocytes Relative: 17 % (ref 12–46)
Lymphs Abs: 1.3 10*3/uL (ref 0.7–4.0)
Lymphs Abs: 1.4 10*3/uL (ref 0.7–4.0)
Monocytes Absolute: 0.7 10*3/uL (ref 0.1–1.0)
Monocytes Absolute: 0.7 10*3/uL (ref 0.1–1.0)
Monocytes Absolute: 0.9 10*3/uL (ref 0.1–1.0)
Monocytes Absolute: 0.9 10*3/uL (ref 0.1–1.0)
Monocytes Relative: 11 % (ref 3–12)
Monocytes Relative: 12 % (ref 3–12)
Monocytes Relative: 9 % (ref 3–12)
Neutro Abs: 4 10*3/uL (ref 1.7–7.7)
Neutro Abs: 4 10*3/uL (ref 1.7–7.7)
Neutro Abs: 5.4 10*3/uL (ref 1.7–7.7)
Neutrophils Relative %: 62 % (ref 43–77)
Neutrophils Relative %: 67 % (ref 43–77)
Neutrophils Relative %: 72 % (ref 43–77)

## 2010-11-04 LAB — BASIC METABOLIC PANEL
BUN: 13 mg/dL (ref 6–23)
BUN: 3 mg/dL — ABNORMAL LOW (ref 6–23)
BUN: 4 mg/dL — ABNORMAL LOW (ref 6–23)
BUN: <1 mg/dL — ABNORMAL LOW (ref 6–23)
CO2: 28 mEq/L (ref 19–32)
CO2: 29 mEq/L (ref 19–32)
CO2: 29 mEq/L (ref 19–32)
CO2: 30 mEq/L (ref 19–32)
Calcium: 9.4 mg/dL (ref 8.4–10.5)
Calcium: 7.8 mg/dL — ABNORMAL LOW (ref 8.4–10.5)
Calcium: 8.1 mg/dL — ABNORMAL LOW (ref 8.4–10.5)
Chloride: 101 mEq/L (ref 96–112)
Chloride: 104 mEq/L (ref 96–112)
Chloride: 105 mEq/L (ref 96–112)
Chloride: 106 mEq/L (ref 96–112)
Creatinine, Ser: 1.23 mg/dL — ABNORMAL HIGH (ref 0.4–1.2)
Creatinine, Ser: 0.88 mg/dL (ref 0.4–1.2)
Creatinine, Ser: 0.9 mg/dL (ref 0.4–1.2)
Creatinine, Ser: 1.04 mg/dL (ref 0.4–1.2)
GFR calc Af Amer: 51 mL/min — ABNORMAL LOW (ref >60)
GFR calc Af Amer: >60 mL/min (ref >60)
GFR calc Af Amer: >60 mL/min (ref >60)
GFR calc non Af Amer: 42 mL/min — ABNORMAL LOW (ref >60)
GFR calc non Af Amer: 50 mL/min — ABNORMAL LOW (ref >60)
GFR calc non Af Amer: 51 mL/min — ABNORMAL LOW (ref >60)
GFR calc non Af Amer: 52 mL/min — ABNORMAL LOW (ref >60)
GFR calc non Af Amer: 54 mL/min — ABNORMAL LOW (ref >60)
Glucose, Bld: 113 mg/dL — ABNORMAL HIGH (ref 70–99)
Glucose, Bld: 150 mg/dL — ABNORMAL HIGH (ref 70–99)
Glucose, Bld: 156 mg/dL — ABNORMAL HIGH (ref 70–99)
Glucose, Bld: 79 mg/dL (ref 70–99)
Potassium: 2.6 mEq/L — CL (ref 3.5–5.1)
Potassium: 3.4 mEq/L — ABNORMAL LOW (ref 3.5–5.1)
Potassium: 3.5 mEq/L (ref 3.5–5.1)
Potassium: 3.5 mEq/L (ref 3.5–5.1)
Potassium: 4 mEq/L (ref 3.5–5.1)
Potassium: 4.5 mEq/L (ref 3.5–5.1)
Sodium: 140 mEq/L (ref 135–145)
Sodium: 134 mEq/L — ABNORMAL LOW (ref 135–145)
Sodium: 135 mEq/L (ref 135–145)
Sodium: 141 mEq/L (ref 135–145)

## 2010-11-04 LAB — CULTURE, BLOOD (ROUTINE X 2)

## 2010-11-04 LAB — CBC
HCT: 30 % — ABNORMAL LOW (ref 36.0–46.0)
HCT: 27.4 % — ABNORMAL LOW (ref 36.0–46.0)
HCT: 28.2 % — ABNORMAL LOW (ref 36.0–46.0)
HCT: 30 % — ABNORMAL LOW (ref 36.0–46.0)
HCT: 30.2 % — ABNORMAL LOW (ref 36.0–46.0)
HCT: 31.3 % — ABNORMAL LOW (ref 36.0–46.0)
HCT: 32.8 % — ABNORMAL LOW (ref 36.0–46.0)
Hemoglobin: 11.2 g/dL — ABNORMAL LOW (ref 12.0–15.0)
Hemoglobin: 11.4 g/dL — ABNORMAL LOW (ref 12.0–15.0)
Hemoglobin: 9.9 g/dL — ABNORMAL LOW (ref 12.0–15.0)
Hemoglobin: 10.4 g/dL — ABNORMAL LOW (ref 12.0–15.0)
Hemoglobin: 11 g/dL — ABNORMAL LOW (ref 12.0–15.0)
Hemoglobin: 12.9 g/dL (ref 12.0–15.0)
Hemoglobin: 9.2 g/dL — ABNORMAL LOW (ref 12.0–15.0)
Hemoglobin: 9.3 g/dL — ABNORMAL LOW (ref 12.0–15.0)
MCHC: 33.7 g/dL (ref 30.0–36.0)
MCHC: 33.8 g/dL (ref 30.0–36.0)
MCHC: 33.9 g/dL (ref 30.0–36.0)
MCHC: 34.1 g/dL (ref 30.0–36.0)
MCV: 89 fL (ref 78.0–100.0)
MCV: 89.3 fL (ref 78.0–100.0)
MCV: 90.8 fL (ref 78.0–100.0)
MCV: 91.4 fL (ref 78.0–100.0)
MCV: 91.5 fL (ref 78.0–100.0)
MCV: 91.8 fL (ref 78.0–100.0)
MCV: 91.9 fL (ref 78.0–100.0)
MCV: 92.5 fL (ref 78.0–100.0)
Platelets: 261 10*3/uL (ref 150–400)
Platelets: 304 10*3/uL (ref 150–400)
Platelets: 309 10*3/uL (ref 150–400)
Platelets: 237 10*3/uL (ref 150–400)
Platelets: 246 10*3/uL (ref 150–400)
Platelets: 281 10*3/uL (ref 150–400)
Platelets: 282 10*3/uL (ref 150–400)
Platelets: 366 10*3/uL (ref 150–400)
RBC: 3.21 MIL/uL — ABNORMAL LOW (ref 3.87–5.11)
RBC: 3.78 MIL/uL — ABNORMAL LOW (ref 3.87–5.11)
RBC: 3.82 MIL/uL — ABNORMAL LOW (ref 3.87–5.11)
RBC: 2.99 MIL/uL — ABNORMAL LOW (ref 3.87–5.11)
RBC: 2.99 MIL/uL — ABNORMAL LOW (ref 3.87–5.11)
RBC: 3.01 MIL/uL — ABNORMAL LOW (ref 3.87–5.11)
RBC: 3.3 MIL/uL — ABNORMAL LOW (ref 3.87–5.11)
RDW: 15.1 % (ref 11.5–15.5)
RDW: 15.9 % — ABNORMAL HIGH (ref 11.5–15.5)
RDW: 13.3 % (ref 11.5–15.5)
RDW: 13.3 % (ref 11.5–15.5)
RDW: 13.4 % (ref 11.5–15.5)
RDW: 13.4 % (ref 11.5–15.5)
RDW: 13.4 % (ref 11.5–15.5)
RDW: 13.5 % (ref 11.5–15.5)
RDW: 13.7 % (ref 11.5–15.5)
RDW: 13.7 % (ref 11.5–15.5)
RDW: 13.8 % (ref 11.5–15.5)
WBC: 11.4 10*3/uL — ABNORMAL HIGH (ref 4.0–10.5)
WBC: 6.4 10*3/uL (ref 4.0–10.5)
WBC: 6.5 10*3/uL (ref 4.0–10.5)
WBC: 8.1 10*3/uL (ref 4.0–10.5)
WBC: 7.2 10*3/uL (ref 4.0–10.5)
WBC: 8.4 10*3/uL (ref 4.0–10.5)
WBC: 9.2 10*3/uL (ref 4.0–10.5)
WBC: 9.8 10*3/uL (ref 4.0–10.5)

## 2010-11-04 LAB — TYPE AND SCREEN: ABO/RH(D): O POS

## 2010-11-04 LAB — URINALYSIS, ROUTINE W REFLEX MICROSCOPIC
Glucose, UA: NEGATIVE mg/dL
Hgb urine dipstick: NEGATIVE
Ketones, ur: 15 mg/dL — AB
Nitrite: NEGATIVE
Protein, ur: NEGATIVE mg/dL
Specific Gravity, Urine: 1.024 (ref 1.005–1.030)
Specific Gravity, Urine: 1.026 (ref 1.005–1.030)
Urobilinogen, UA: 0.2 mg/dL (ref 0.0–1.0)
pH: 6 (ref 5.0–8.0)

## 2010-11-04 LAB — GLUCOSE, CAPILLARY
Glucose-Capillary: 79 mg/dL (ref 70–99)
Glucose-Capillary: 86 mg/dL (ref 70–99)
Glucose-Capillary: 86 mg/dL (ref 70–99)

## 2010-11-04 LAB — HEMOCCULT GUIAC POC 1CARD (OFFICE): Fecal Occult Bld: NEGATIVE

## 2010-11-04 LAB — URINE MICROSCOPIC-ADD ON

## 2010-11-04 LAB — MAGNESIUM
Magnesium: 1.9 mg/dL (ref 1.5–2.5)
Magnesium: 1.5 mg/dL (ref 1.5–2.5)
Magnesium: 1.7 mg/dL (ref 1.5–2.5)

## 2010-11-04 LAB — CLOSTRIDIUM DIFFICILE EIA: C difficile Toxins A+B, EIA: NEGATIVE

## 2010-11-04 LAB — URINE CULTURE: Culture: NO GROWTH

## 2010-11-04 LAB — LIPASE, BLOOD: Lipase: 29 U/L (ref 11–59)

## 2010-11-05 ENCOUNTER — Encounter: Payer: Self-pay | Admitting: Emergency Medicine

## 2010-11-05 ENCOUNTER — Other Ambulatory Visit: Payer: Self-pay | Admitting: Emergency Medicine

## 2010-11-05 ENCOUNTER — Ambulatory Visit (INDEPENDENT_AMBULATORY_CARE_PROVIDER_SITE_OTHER)
Admission: RE | Admit: 2010-11-05 | Discharge: 2010-11-05 | Disposition: A | Discharge status: Home / Self Care | Payer: Medicare Other | Source: Ambulatory Visit | Attending: Emergency Medicine | Admitting: Emergency Medicine

## 2010-11-05 ENCOUNTER — Ambulatory Visit: Payer: Medicare Other | Admitting: Emergency Medicine

## 2010-11-05 DIAGNOSIS — R05 Cough: Secondary | ICD-10-CM

## 2010-11-05 LAB — URINALYSIS, ROUTINE W REFLEX MICROSCOPIC
Glucose, UA: NEGATIVE mg/dL
Hgb urine dipstick: NEGATIVE
Hgb urine dipstick: NEGATIVE
Nitrite: NEGATIVE
Protein, ur: NEGATIVE mg/dL
Specific Gravity, Urine: 1.013 (ref 1.005–1.030)
Specific Gravity, Urine: 1.008 (ref 1.005–1.030)
Urobilinogen, UA: 0.2 mg/dL (ref 0.0–1.0)
pH: 5.5 (ref 5.0–8.0)

## 2010-11-05 LAB — BASIC METABOLIC PANEL
BUN: 10 mg/dL (ref 6–23)
BUN: 14 mg/dL (ref 6–23)
BUN: 8 mg/dL (ref 6–23)
BUN: 6 mg/dL (ref 6–23)
BUN: 7 mg/dL (ref 6–23)
BUN: 8 mg/dL (ref 6–23)
CO2: 29 mEq/L (ref 19–32)
CO2: 30 mEq/L (ref 19–32)
CO2: 23 mEq/L (ref 19–32)
CO2: 24 mEq/L (ref 19–32)
CO2: 27 mEq/L (ref 19–32)
Calcium: 8 mg/dL — ABNORMAL LOW (ref 8.4–10.5)
Calcium: 8.1 mg/dL — ABNORMAL LOW (ref 8.4–10.5)
Calcium: 8.3 mg/dL — ABNORMAL LOW (ref 8.4–10.5)
Calcium: 8.3 mg/dL — ABNORMAL LOW (ref 8.4–10.5)
Calcium: 7.5 mg/dL — ABNORMAL LOW (ref 8.4–10.5)
Calcium: 7.7 mg/dL — ABNORMAL LOW (ref 8.4–10.5)
Chloride: 101 mEq/L (ref 96–112)
Chloride: 102 mEq/L (ref 96–112)
Chloride: 101 mEq/L (ref 96–112)
Chloride: 103 mEq/L (ref 96–112)
Chloride: 109 mEq/L (ref 96–112)
Creatinine, Ser: 0.66 mg/dL (ref 0.4–1.2)
Creatinine, Ser: 0.8 mg/dL (ref 0.4–1.2)
Creatinine, Ser: 1.12 mg/dL (ref 0.4–1.2)
Creatinine, Ser: 0.81 mg/dL (ref 0.4–1.2)
GFR calc Af Amer: 58 mL/min — ABNORMAL LOW (ref >60)
GFR calc Af Amer: >60 mL/min (ref >60)
GFR calc Af Amer: >60 mL/min (ref >60)
GFR calc non Af Amer: 48 mL/min — ABNORMAL LOW (ref >60)
GFR calc non Af Amer: >60 mL/min (ref >60)
GFR calc non Af Amer: >60 mL/min (ref >60)
GFR calc non Af Amer: 54 mL/min — ABNORMAL LOW (ref >60)
GFR calc non Af Amer: >60 mL/min (ref >60)
GFR calc non Af Amer: >60 mL/min (ref >60)
Glucose, Bld: 114 mg/dL — ABNORMAL HIGH (ref 70–99)
Glucose, Bld: 133 mg/dL — ABNORMAL HIGH (ref 70–99)
Glucose, Bld: 144 mg/dL — ABNORMAL HIGH (ref 70–99)
Glucose, Bld: 150 mg/dL — ABNORMAL HIGH (ref 70–99)
Glucose, Bld: 65 mg/dL — ABNORMAL LOW (ref 70–99)
Potassium: 4.5 mEq/L (ref 3.5–5.1)
Potassium: 3.4 mEq/L — ABNORMAL LOW (ref 3.5–5.1)
Potassium: 4 mEq/L (ref 3.5–5.1)
Potassium: 4 mEq/L (ref 3.5–5.1)
Potassium: 4.2 mEq/L (ref 3.5–5.1)
Sodium: 136 mEq/L (ref 135–145)
Sodium: 140 mEq/L (ref 135–145)
Sodium: 131 mEq/L — ABNORMAL LOW (ref 135–145)
Sodium: 135 mEq/L (ref 135–145)
Sodium: 137 mEq/L (ref 135–145)
Sodium: 139 mEq/L (ref 135–145)
Sodium: 140 mEq/L (ref 135–145)

## 2010-11-05 LAB — COMPREHENSIVE METABOLIC PANEL
ALT: 11 U/L (ref 0–35)
ALT: 15 U/L (ref 0–35)
AST: 11 U/L (ref 0–37)
AST: 12 U/L (ref 0–37)
AST: 21 U/L (ref 0–37)
Albumin: 2.2 g/dL — ABNORMAL LOW (ref 3.5–5.2)
Albumin: 2.3 g/dL — ABNORMAL LOW (ref 3.5–5.2)
Alkaline Phosphatase: 65 U/L (ref 39–117)
Alkaline Phosphatase: 68 U/L (ref 39–117)
Alkaline Phosphatase: 67 U/L (ref 39–117)
BUN: 5 mg/dL — ABNORMAL LOW (ref 6–23)
CO2: 28 mEq/L (ref 19–32)
Chloride: 102 mEq/L (ref 96–112)
Chloride: 105 mEq/L (ref 96–112)
GFR calc Af Amer: >60 mL/min (ref >60)
GFR calc Af Amer: >60 mL/min (ref >60)
GFR calc non Af Amer: 52 mL/min — ABNORMAL LOW (ref >60)
Potassium: 3.5 mEq/L (ref 3.5–5.1)
Potassium: 3.8 mEq/L (ref 3.5–5.1)
Sodium: 132 mEq/L — ABNORMAL LOW (ref 135–145)
Sodium: 140 mEq/L (ref 135–145)
Total Bilirubin: 0.4 mg/dL (ref 0.3–1.2)
Total Bilirubin: 0.4 mg/dL (ref 0.3–1.2)
Total Protein: 5.2 g/dL — ABNORMAL LOW (ref 6.0–8.3)

## 2010-11-05 LAB — CROSSMATCH: Antibody Screen: NEGATIVE

## 2010-11-05 LAB — GLUCOSE, CAPILLARY
Glucose-Capillary: 120 mg/dL — ABNORMAL HIGH (ref 70–99)
Glucose-Capillary: 130 mg/dL — ABNORMAL HIGH (ref 70–99)
Glucose-Capillary: 135 mg/dL — ABNORMAL HIGH (ref 70–99)
Glucose-Capillary: 136 mg/dL — ABNORMAL HIGH (ref 70–99)
Glucose-Capillary: 141 mg/dL — ABNORMAL HIGH (ref 70–99)
Glucose-Capillary: 148 mg/dL — ABNORMAL HIGH (ref 70–99)
Glucose-Capillary: 150 mg/dL — ABNORMAL HIGH (ref 70–99)
Glucose-Capillary: 154 mg/dL — ABNORMAL HIGH (ref 70–99)
Glucose-Capillary: 158 mg/dL — ABNORMAL HIGH (ref 70–99)
Glucose-Capillary: 161 mg/dL — ABNORMAL HIGH (ref 70–99)
Glucose-Capillary: 177 mg/dL — ABNORMAL HIGH (ref 70–99)
Glucose-Capillary: 188 mg/dL — ABNORMAL HIGH (ref 70–99)
Glucose-Capillary: 198 mg/dL — ABNORMAL HIGH (ref 70–99)
Glucose-Capillary: 108 mg/dL — ABNORMAL HIGH (ref 70–99)
Glucose-Capillary: 143 mg/dL — ABNORMAL HIGH (ref 70–99)
Glucose-Capillary: 74 mg/dL (ref 70–99)
Glucose-Capillary: 81 mg/dL (ref 70–99)
Glucose-Capillary: 95 mg/dL (ref 70–99)

## 2010-11-05 LAB — DIFFERENTIAL
Basophils Absolute: 0 10*3/uL (ref 0.0–0.1)
Basophils Relative: 0 % (ref 0–1)
Eosinophils Absolute: 0.3 10*3/uL (ref 0.0–0.7)
Eosinophils Absolute: 0.2 10*3/uL (ref 0.0–0.7)
Eosinophils Relative: 4 % (ref 0–5)
Eosinophils Relative: 2 % (ref 0–5)
Monocytes Absolute: 0.7 10*3/uL (ref 0.1–1.0)
Monocytes Relative: 8 % (ref 3–12)
Neutro Abs: 6.4 10*3/uL (ref 1.7–7.7)

## 2010-11-05 LAB — CBC
HCT: 27.3 % — ABNORMAL LOW (ref 36.0–46.0)
HCT: 27.4 % — ABNORMAL LOW (ref 36.0–46.0)
HCT: 31.2 % — ABNORMAL LOW (ref 36.0–46.0)
Hemoglobin: 7.8 g/dL — ABNORMAL LOW (ref 12.0–15.0)
Hemoglobin: 10.6 g/dL — ABNORMAL LOW (ref 12.0–15.0)
Hemoglobin: 10.8 g/dL — ABNORMAL LOW (ref 12.0–15.0)
Hemoglobin: 9.2 g/dL — ABNORMAL LOW (ref 12.0–15.0)
Hemoglobin: 9.8 g/dL — ABNORMAL LOW (ref 12.0–15.0)
MCHC: 33.6 g/dL (ref 30.0–36.0)
MCHC: 34.1 g/dL (ref 30.0–36.0)
MCV: 88.6 fL (ref 78.0–100.0)
MCV: 88.1 fL (ref 78.0–100.0)
MCV: 88.3 fL (ref 78.0–100.0)
Platelets: 300 10*3/uL (ref 150–400)
Platelets: 377 10*3/uL (ref 150–400)
Platelets: 413 10*3/uL — ABNORMAL HIGH (ref 150–400)
Platelets: 169 10*3/uL (ref 150–400)
Platelets: 246 10*3/uL (ref 150–400)
RBC: 2.63 MIL/uL — ABNORMAL LOW (ref 3.87–5.11)
RBC: 3.07 MIL/uL — ABNORMAL LOW (ref 3.87–5.11)
RBC: 4.17 MIL/uL (ref 3.87–5.11)
RDW: 14.2 % (ref 11.5–15.5)
RDW: 14.6 % (ref 11.5–15.5)
RDW: 14.1 % (ref 11.5–15.5)
RDW: 14.2 % (ref 11.5–15.5)
RDW: 14.2 % (ref 11.5–15.5)
RDW: 14.3 % (ref 11.5–15.5)
WBC: 11.5 10*3/uL — ABNORMAL HIGH (ref 4.0–10.5)
WBC: 6.9 10*3/uL (ref 4.0–10.5)
WBC: 8.4 10*3/uL (ref 4.0–10.5)
WBC: 10 10*3/uL (ref 4.0–10.5)
WBC: 8.7 10*3/uL (ref 4.0–10.5)

## 2010-11-05 LAB — URINE MICROSCOPIC-ADD ON

## 2010-11-05 LAB — PHOSPHORUS
Phosphorus: 3.1 mg/dL (ref 2.3–4.6)
Phosphorus: 3.2 mg/dL (ref 2.3–4.6)
Phosphorus: 3.4 mg/dL (ref 2.3–4.6)

## 2010-11-05 LAB — MAGNESIUM
Magnesium: 2.1 mg/dL (ref 1.5–2.5)
Magnesium: 1.6 mg/dL (ref 1.5–2.5)

## 2010-11-05 LAB — POCT I-STAT EG7
Acid-base deficit: 1 mmol/L (ref 0.0–2.0)
Bicarbonate: 25.7 mEq/L — ABNORMAL HIGH (ref 20.0–24.0)
Potassium: 3.4 mEq/L — ABNORMAL LOW (ref 3.5–5.1)
Sodium: 139 mEq/L (ref 135–145)
TCO2: 27 mmol/L (ref 0–100)

## 2010-11-05 LAB — URINE CULTURE: Colony Count: 40000

## 2010-11-15 NOTE — Assessment & Plan Note (Signed)
Summary: COPD, cough   Visit Type:  Follow-up Copy to:  Nicholes Mango Primary Provider/Referring Provider:  Oneta Rack  CC:  COPD.  The pt says dyspnea is better since using Symbicort daily...rescue use is down to 1-2 times daily...yellow to green sputum prod has increased x1-2 days...wheezing worse at night.  History of Present Illness: [name] is a 77 year old woman with COPD that is in the mild to moderate range.  She is followed by Dr Gala Romney for congestive heart failure secondary to nonischemic cardiomyopathy.  Previous EF was 25-30%.  She is status post BiV ICD, EF now 60%. She also has diastolic dysfunction and a history of pulmonary edema.  Has OSA but unable to tolerate CPAP. Pacer changed   ROV 03/08/10 -- returns for her COPD, systolic/diastolic CHF, OSA. Since last visit she had reversal of her colostomy, still doesn't have her strength back. She still has cough. Has been using flutter valve, has been trying to practice swallowing precautions. She coughs whenever she takes her Advair. Seems to tolerate the Spiriva. She has had some increased LE edema, currently seems to be at baseline.   ROV 05/14/10 -- 77 yo woman, moderate COPD, NICM s/p BiV pacer, OSA (not on CPAP). Last time we discussed her cough, stopped Advair and continued Spiriva. Also started fluticasone nasal spray at bedtime. Her cough is better. Hasn't really missed the Advair, breathing is stable.   ROV 10/05/10 -- regular f/u for COPD, also hx systolic and diastolic CHF s/p BiV pacer, OSA not on CPAP. Just had BiV pacer changed on 09/03/10. She returns telling me that her SOB has slowly progressed over last 6 - 12 months. Not able to walk as far, not able to cook or clean. She has been coughing daily, not always productive. Hears wheeze at night and sometimes during the day, better with coughing up phlegm. Currently on Spiriva, stopped Advair. Not taking fluticasone spray. Hasn't tolerated mucinex before.   ROV 11/05/10 --  COPD, systolic/diastolic CHF, biV pacer, OSA not on CPAP. Last time we added Symbicort to Spiriva. Her breathing has benefitted, SABA use has decreased to 2x a day. ? having some palpitations. Still limited with exertion. Wakes up at night and uses the SABA, helps her. Still coughing phlegm every day. Fluticasone spray may have helped some.   Preventive Screening-Counseling & Management  Alcohol-Tobacco     Smoking Status: quit     Packs/Day: 2.0     Year Started: 1951     Year Quit: 1994  Current Medications (verified): 1)  Synthroid 75 Mcg  Tabs (Levothyroxine Sodium) .... Take 1 Tablet By Mouth Once A Day 2)  Prilosec 20 Mg Cpdr (Omeprazole) .... Two Times A Day 3)  Spiriva Handihaler 18 Mcg  Caps (Tiotropium Bromide Monohydrate) .... Inhale Contents of 1 Capsule Once Daily 4)  Ventolin Hfa 108 (90 Base) Mcg/act  Aers (Albuterol Sulfate) .[name].. 1-2 Puffs Every 4-6 Hours As Needed 5)  Benadryl 25 Mg  Tabs (Diphenhydramine Hcl) .... As Needed 6)  Diovan Hct 160-12.5 Mg Tabs (Valsartan-Hydrochlorothiazide) .... Take 1 Tablet By Mouth Once A Day 7)  Miralax  Powd (Polyethylene Glycol 3350) .... Once Daily 8)  Fenofibrate Micronized 134 Mg Caps (Fenofibrate Micronized) .[name].. 1 By Mouth Every Other Day 9)  Symbicort 160-4.5 Mcg/act Aero (Budesonide-Formoterol Fumarate) .... 2 Puffs Two Times A Day 10)  Fluticasone Propionate 50 Mcg/act Susp (Fluticasone Propionate) .... 2 Sprays Each Nostril Two Times A Day  Allergies (verified): 1)  ! Lescol 2)  !  Vytorin 3)  ! Crestor 4)  ! Lopid 5)  ! Lipitor 6)  ! Ace Inhibitors 7)  ! Bisoprolol Fumarate (Bisoprolol Fumarate) 8)  ! * Tylenol With Codeine 9)  Lisinopril (Lisinopril) 10)  * Oxycotin  Vital Signs:  Patient profile:   77 year old female Height:      64 inches (162.56 cm) Weight:      131.50 pounds (59.77 kg) BMI:     22.65 O2 Sat:      99 % on Room air Temp:     97.7 degrees F (36.50 degrees C) oral Pulse rate:   82 / minute BP  sitting:   132 / 80  (left arm) Cuff size:   regular  Vitals Entered By: Michel Bickers CMA (November 05, 2010 1:20 PM)  O2 Sat at Rest %:  99 O2 Flow:  Room air CC: COPD.  The pt says dyspnea is better since using Symbicort daily...rescue use is down to 1-2 times daily...yellow to green sputum prod has increased x1-2 days...wheezing worse at night Comments Medications reviewed with patient Michel Bickers CMA  November 05, 2010 1:29 PM   Physical Exam  General:  normal appearance, healthy appearing, and thin.   Head:  normocephalic and atraumatic Nose:  some mild nasal congestion, no active gtt Mouth:  no deformity or lesions Neck:  no masses, thyromegaly, or abnormal cervical nodes Chest Wall:  no deformities noted Lungs:  some focal exp wheeze R  Heart:  regular rate and rhythm, S1, S2 without murmurs, rubs, gallops, or clicks Abdomen:  not examined Extremities:  no edema Skin:  intact without lesions or rashes Psych:  alert and cooperative; normal mood and affect; normal attention span and concentration   Impression & Recommendations:  Problem # 1:  COPD (ICD-496)  Problem # 2:  COUGH (ICD-786.2)  Orders: Consultation Level IV (99244) T-2 View CXR (71020TC)  Problem # 3:  PULMONARY HYPERTENSION, SECONDARY (ICD-416.8)  Patient Instructions: 1)  Please continue your Spiriva once daily  2)  We will continue your Symbicort two times a day  3)  Continue your fluticasone nasal spray two times a day  4)  Continue your benadryl every night 5)  Continue your Prilosec once daily  6)  CXR today.  7)  Follow up with Dr Delton Coombes in 4 months or as needed

## 2010-11-23 ENCOUNTER — Encounter: Payer: Self-pay | Admitting: Internal Medicine

## 2010-11-28 ENCOUNTER — Encounter: Payer: Self-pay | Admitting: Internal Medicine

## 2010-11-28 ENCOUNTER — Ambulatory Visit (INDEPENDENT_AMBULATORY_CARE_PROVIDER_SITE_OTHER): Payer: Medicare Other | Admitting: Internal Medicine

## 2010-11-28 VITALS — BP 144/70 | HR 73 | Resp 18 | Ht 64.0 in | Wt 133.8 lb

## 2010-11-28 DIAGNOSIS — I43 Cardiomyopathy in diseases classified elsewhere: Secondary | ICD-10-CM

## 2010-11-28 DIAGNOSIS — J449 Chronic obstructive pulmonary disease, unspecified: Secondary | ICD-10-CM

## 2010-11-28 DIAGNOSIS — I429 Cardiomyopathy, unspecified: Secondary | ICD-10-CM

## 2010-11-28 NOTE — Assessment & Plan Note (Signed)
Resolved. °Continue current regimen. °

## 2010-11-28 NOTE — Patient Instructions (Signed)
Your physician recommends that you schedule a follow-up appointment in: 1 year with Dr. Bensimhon   

## 2010-11-28 NOTE — Assessment & Plan Note (Signed)
Followed by Dr. Delton Coombes. No desaturation with exertion.

## 2010-11-28 NOTE — Progress Notes (Signed)
HPI: [name] is a 77 year old woman with a history of congestive heart failure, secondary nonischemic cardiomyopathy, previous EF was 25-30%.  She is status post BIV/ICD.  Most recent ejection fraction was 60%.  She also has a history of COPD with chronic dyspnea, hypertension, hyperlipidemia, chronic renal insufficiency and partial colon resection due to obstruction in January 2011..  In January underwent elective replacement of ICD and pocket revision (due to device migration). Returns for f/u. Overall doing fairly well. Has some "congestion" and was recently treated with Z-pack and prednisone. Has chronic dyspnea but no significant change. No chest pain. Mild edema in L leg. Orthopnea or PND. No long travel. Has developed a hernia at her colonostomy site.    ROS: All systems negative except as listed in HPI, PMH and Problem List.  Past Medical History  Diagnosis Date  . CONGESTIVE HEART FAILURE 06/03/2007  . COPD 06/03/2007  . HYPERTENSION 06/03/2007  . HYPOTHYROIDISM 06/03/2007    Current Outpatient Prescriptions  Medication Sig Dispense Refill  . albuterol (VENTOLIN HFA) 108 (90 BASE) MCG/ACT inhaler Inhale 2 puffs into the lungs every 6 (six) hours as needed.        . budesonide-formoterol (SYMBICORT) 160-4.5 MCG/ACT inhaler Inhale 2 puffs into the lungs 2 (two) times daily.        [name] diltiazem (CARDIZEM CD) 240 MG 24 hr capsule Take 240 mg by mouth daily.       . diphenhydrAMINE (SOMINEX) 25 MG tablet Take 25 mg by mouth as needed.        . fenofibrate micronized (LOFIBRA) 134 MG capsule Take 134 mg by mouth every other day.        . fluticasone (FLONASE) 50 MCG/ACT nasal spray 2 sprays by Nasal route daily.        [name] FREESTYLE LITE test strip       . Lancets (FREESTYLE) lancets       . levothyroxine (SYNTHROID, LEVOTHROID) 75 MCG tablet Take 75 mcg by mouth daily.        [name] omeprazole (PRILOSEC) 20 MG capsule Take 20 mg by mouth 2 (two) times daily.        . polyethylene glycol  (MIRALAX / GLYCOLAX) packet Take 17 g by mouth daily.        [name] tiotropium (SPIRIVA) 18 MCG inhalation capsule Place 18 mcg into inhaler and inhale daily.        . traMADol (ULTRAM) 50 MG tablet Take 50 mg by mouth 4 (four) times daily.       . valsartan-hydrochlorothiazide (DIOVAN-HCT) 160-12.5 MG per tablet Take 1 tablet by mouth daily.           PHYSICAL EXAM: Filed Vitals:   11/28/10 1118  BP: 144/70  Pulse: 73  Resp: 18  General:  Elderly thin woman in no acute distress.  Ambulates around the clinic slowly without any respiratory difficulty. Occasional cough O2 sats 98% at rest 96% with exertion HEENT:  Normal.  NECK:  Supple.  No JVD.  Carotids are 2+ bilaterally without any bruits. There is no lymphadenopathy or thyromegaly. LUNGS:  Clear with diminished breath sounds throughout and mild end- expiratory wheezing. Chest: + subcutaneous atrophy superior to dvice. But pocket itself lloks good.  CARDIAC:  PMI is nondisplaced.  She has very distant heart sounds with an S4.  No obvious murmur. ABDOMEN:  Soft, nontender, nondistended.  Multiple surgical scars. Small incisional hernia - easily reducible EXTREMITIES:  Warm with no cyanosis, clubbing or edema.  No rash. NEURO:  Alert and oriented x3.  Cranial nerves II-XII are intact.  Moves all 4 extremities  without difficulty.  Affect is pleasant.    ECG: NSR 73 with v-pacing.    ASSESSMENT & PLAN:

## 2010-12-03 ENCOUNTER — Ambulatory Visit (INDEPENDENT_AMBULATORY_CARE_PROVIDER_SITE_OTHER): Payer: Medicare Other | Admitting: *Deleted

## 2010-12-03 ENCOUNTER — Encounter (INDEPENDENT_AMBULATORY_CARE_PROVIDER_SITE_OTHER): Payer: Medicare Other | Admitting: *Deleted

## 2010-12-03 DIAGNOSIS — R0989 Other specified symptoms and signs involving the circulatory and respiratory systems: Secondary | ICD-10-CM

## 2010-12-03 DIAGNOSIS — I429 Cardiomyopathy, unspecified: Secondary | ICD-10-CM

## 2010-12-03 NOTE — Progress Notes (Signed)
Pacer check in clinic  

## 2010-12-24 ENCOUNTER — Other Ambulatory Visit: Payer: Self-pay | Admitting: General Surgery

## 2010-12-24 DIAGNOSIS — K439 Ventral hernia without obstruction or gangrene: Secondary | ICD-10-CM

## 2010-12-28 ENCOUNTER — Ambulatory Visit
Admission: RE | Admit: 2010-12-28 | Discharge: 2010-12-28 | Disposition: A | Discharge status: Home / Self Care | Payer: Medicare Other | Source: Ambulatory Visit | Attending: General Surgery | Admitting: General Surgery

## 2010-12-28 DIAGNOSIS — K439 Ventral hernia without obstruction or gangrene: Secondary | ICD-10-CM

## 2010-12-28 MED ORDER — IOHEXOL 300 MG/ML  SOLN: 100.0000 mL | Freq: Once | INTRAMUSCULAR | Status: AC | PRN

## 2011-01-01 NOTE — Procedures (Signed)
[name], [name] NO.:  [id number]   MEDICAL RECORD NO.:  [id number]          PATIENT TYPE:  OUT   LOCATION:  SLEEP CENTER                 FACILITY[facility]   PHYSICIAN:  Barbaraann Share, MD,FCCPDATE OF BIRTH:  12/05/31   DATE OF STUDY:  05/22/2007                            NOCTURNAL POLYSOMNOGRAM   REFERRING PHYSICIAN:  Leslye Peer, MD   INDICATION FOR STUDY:  Hypersomnia with sleep apnea.  The patient  returns for CPAP pressure optimization.   EPWORTH SLEEPINESS SCORE:  One.   MEDICATIONS:   SLEEP ARCHITECTURE:  The patient had a total sleep time of 149 minutes  with very little slow wave sleep and REM.  Sleep onset latency was  prolonged at 102 minutes and REM onset was mildly increased at 106  minutes.  Sleep efficiency was decreased at 33%.   RESPIRATORY DATA:  The patient was placed on a small ResMed Quattro full  face mask and CPAP titration was initiated.  It should be noted the  patient had very difficult issues with mask fit as well as coughing.  It  appeared from the date available that her optimal CPAP pressure is  between 14-to-15-cmH2O, however, she clearly had a few breakthrough  events thereafter.   OXYGEN DATA:  There was oxygen desaturation as low as 91%.   CARDIAC DATA:  No clinically significant arrhythmias were noted.   MOVEMENT-PARASOMNIA:  None.   IMPRESSIONS-RECOMMENDATIONS:  1. CPAP titration study reveals adequate control on a final CPAP      pressure of 14-to-15-cmH2O delivered by a small ResMed Quattro face      mask.  However, it should be noted the patient had a lot of issues      with mask leaking and therefore, her pressure may be overestimated.  2. The patient had difficulty with coughing during the first part of      the night that eventually resolved.  Clinical correlation is      suggested.      Barbaraann Share, MD,FCCP  Diplomate, American Board of Sleep  Medicine  Electronically Signed     [facility]  D:  05/28/2007 09:15:30  T:  05/28/2007 12:26:10  Job:  [id number]

## 2011-01-01 NOTE — Procedures (Signed)
[name], Amy NO.:  [id number]   MEDICAL RECORD NO.:  [id number]          PATIENT TYPE:  OUT   LOCATION:  SLEEP CENTER                 FACILITY:  [facility]   PHYSICIAN:  Barbaraann Share, MD,FCCPDATE OF BIRTH:  06-May-1932   DATE OF STUDY:  04/13/2007                            NOCTURNAL POLYSOMNOGRAM   REFERRING PHYSICIAN:  Leslye Peer, MD   INDICATION FOR STUDY:  Hypersomnia with sleep apnea.   EPWORTH SLEEPINESS SCORE:  1   MEDICATIONS:   SLEEP ARCHITECTURE:  The patient had total sleep time of 217 minutes  with very little REM or slow-wave sleep.  Sleep onset latency was  prolonged at 74 minutes and REM onset was prolonged at 220 minutes.  Sleep efficiency was very poor at 58%.   RESPIRATORY DATA:  The patient was found to have 71 obstructive  hypopneas and 6 obstructive apneas for an apnea-hypopnea index of 21  events per hour.  The events were not positional.  There was mild-to-  moderate snoring noted throughout.   OXYGEN DATA:  There was O2 desaturation as low as 78% during the  patient's REM events.   CARDIAC DATA:  A pacer spike was noted superimposed over the QRS  throughout the study.  There did not appear to be any clinically  significant arrhythmias.   MOVEMENT-PARASOMNIA:  None.   IMPRESSIONS-RECOMMENDATIONS:  1. Moderate obstructive sleep apnea/hypopnea syndrome, with an apnea-      hypopnea index of 21 events per hour and O2 desaturation as low as      78%.  Treatment for this degree of sleep apnea can include weight      loss alone if applicable, upper airway surgery, oral appliance, and      also CPAP.  Clinical correlation suggested.  2. Pacer spikes superimposed on QRS throughout the study; however,      there was no clinically significant arrhythmia noted.      Barbaraann Share, MD,FCCP  Diplomate, American Board of Sleep  Medicine  Electronically Signed     KMC/MEDQ  D:  04/29/2007 14:18:04  T:  04/30/2007  10:12:50  Job:  [id number]

## 2011-01-01 NOTE — Consult Note (Signed)
NAMESHANINA, [name]          ACCOUNT NO.:  [id number]   MEDICAL RECORD NO.:  [id number]          PATIENT TYPE:  INP   LOCATION:  5526                         FACILITY:  [facility]   PHYSICIAN:  Bevelyn Buckles. Bensimhon, MDDATE OF BIRTH:  [birth year]   DATE OF CONSULTATION:  04/24/2008  DATE OF DISCHARGE:                                 CONSULTATION   PRIMARY CARDIOLOGIST:  Bevelyn Buckles. Bensimhon, MD   ELECTROPHYSIOLOGY:  Duke Salvia, MD, [facility]   PRIMARY CARE:  Lucky Cowboy, M.D.   [name] is a 77 year old Caucasian female with known history of  nonischemic cardiomyopathy status post BiV ICD [employer] device.  The  patient previously had an EF of 35%.  Most recent echocardiogram done in  2007 showed EF 60%-65%.  She also has significant COPD and asthma status  post cardiac catheterization that showed minimal coronary plaque in  2005.  [name] presents today to [facility] Emergency Room  complaining of multiple issues, mostly related to nausea, vomiting,  diarrhea, fever, chills, night sweats, diaphoresis, and productive  cough.  She states she has recently been treated for diverticulitis and  bronchitis per the patient's report.  She states she has done 2 rounds  of antibiotics; however, she called our answering service this morning  complaining of increased weakness, dizziness with high blood pressure.  Per our office notes, her blood pressure generally runs 150s to 80s.  She states this morning she was very dizzy and weak, her blood pressure  was 187/105.  Around 2:30 this morning, she states she woke up because  she was nauseated and vomiting, her blood pressure was 200/110.  She  finally felt so bad she came to the emergency room after talking with  me.  Here in the ER, blood pressure was not actually that uncontrolled,  she was 184/87, but she was wheezing.  According to the ER physician,  she was given Solu-Medrol and nebulizer treatment, oral potassium for K  of 3.2,  labetalol, and fentanyl.  The patient currently states she is  feeling much better.  She has, however, continued to complain of  abdominal bloating and cramping and a productive cough of clear thick  sputum.  She states compliance with her medications even with the  vomiting.  The only dose that she knows for certain that she lost with  the emesis was this morning's dose.   PAST MEDICAL HISTORY:  Coronary history as stated above, also  hypertension, dyslipidemia, mild renal insufficiency, history of GI  bleed while on aspirin, treated hypothyroidism, GERD, hiatal hernia,  obstructive sleep apnea with noncompliance to CPAP, and diverticulitis.   She lives in [place] with her husband.  She quit smoking in 1998.  Denies any drug or alcohol use.   FAMILY HISTORY:  Mother deceased at age 30.  She had no known coronary  artery disease and brain tumor.  Father committed suicide, although he  also had a brain tumor.  The patient is estranged from her sibling.   REVIEW OF SYSTEMS:  Positive for fever, chills, sweats, dyspnea on  exertion, cough, wheezing, generalized weakness, nausea, vomiting,  diarrhea,  abdominal bloating, and cramping.   ALLERGIES:  ACE INHIBITOR cause coughing, intolerance to STATINS and  ASPIRIN, GI bleed.   Meds include pravastatin 40, fenofibrate 134, hydrochlorothiazide 12.5,  carvedilol 6.25 b.i.d., Spiriva inhaler, Atacand 32, Synthroid 0.075,  Advair 250/50, fluticasone nasal spray, ProAir inhaler, Aciphex, and  recent antibiotic therapy x2 different medications according to the  patient.   PHYSICAL EXAMINATION:  VITAL SIGNS:  Temp 98.2, pulse 80, respirations  16, blood pressure currently 172/85, and sating 99% on room air.  GENERAL:  In no acute distress.  HEENT:  Unremarkable.  NECK:  Supple without lymphadenopathy and no bruits.  No JVD.  CARDIOVASCULAR:  Reveals S1 and S2, currently regular rate and rhythm.  LUNGS:  She has fine scattered rhonchi.  I  do not hear any wheezing at  this time.  SKIN:  Warm and dry.  ABDOMEN:  Soft.  It is tender in bilateral lower quads.  LOWER EXTREMITIES:  Without clubbing, cyanosis, or edema.  NEUROLOGIC:  Alert and oriented x3.   Chest x-ray showing no acute findings.  CT of the head showing no acute  findings.  EKG ventricular pacing 70s-80s, x2 sets of negative cardiac  enzymes.  INR 1.1.  UA negative.  Blood cultures pending.  Hemoccult  negative.  Potassium 3.2, BUN and creatinine 4 and 1.1, H&H 13.3 and 41  with WBCs 9.4.  AST 16.   IMPRESSION:  Hypertension worsening in the setting of nausea, vomiting,  diarrhea, question whether or not the patient may have C. diff after  recent rounds of antibiotics.  The patient is to be admitted by the  [facility] Team in regards to her blood pressure which we were asked to  manage, and we are going to increase her carvedilol to 12.5 mg b.i.d.,  increase her hydrochlorothiazide to 12.5 mg.  She will need a repeat  BMET in the morning, most likely hypokalemic in the setting of nausea,  vomiting, and diarrhea.  We will leave further management to [facility]  Team who is admitting the patient.      Dorian Pod, ACNP      Bevelyn Buckles. Bensimhon, MD  Electronically Signed    MB/MEDQ  D:  04/24/2008  T:  04/25/2008  Job:  [id number]

## 2011-01-01 NOTE — H&P (Signed)
[name], [name]          ACCOUNT NO.:  [id number]   MEDICAL RECORD NO.:  [id number]          PATIENT TYPE:  INP   LOCATION:  5526                         FACILITY:  [facility]   PHYSICIAN:  Peggye Pitt, M.D. DATE OF BIRTH:  [birth year]   DATE OF ADMISSION:  04/24/2008  DATE OF DISCHARGE:                              HISTORY & PHYSICAL   PRIMARY CARE PHYSICIAN:  [name], M.D.   CHIEF COMPLAINT:  Diarrhea, dizziness, increased blood pressure.   HISTORY OF PRESENT ILLNESS:  [name] is a 77 year old obese white  woman who was recently treated for diverticulitis with 2 unknown  antibiotics by her PCP.  She is here today with severe diarrhea of about  5-6 episodes a day, which she describes as explosive, light brown in  color with a very foul odor as well as diffuse abdominal pain.  She felt  dizzy and weak this morning, took her blood pressure and noticed that it  was elevated at 187/105.  This concerned her and she came into the ED  for evaluation.  She does not describe any chest pain, shortness of  breath, or any syncopal events.   ALLERGIES:  She has stated allergies to sulfa drugs and H2 blockers,  unknown reaction.   PAST MEDICAL HISTORY:  1. COPD.  2. Hypertension.  3. Hyperlipidemia.  4. Hypothyroidism.  5. GERD.  6. CHF secondary to nonischemic cardiomyopathy, status post an AICD      placement.   HOME MEDICATIONS:  1. Zetia 10 mg p.o. daily.  2. Synthroid 75 mcg p.o. daily.  3. TriCor 145 mg p.o. at bedtime.  4. Atacand 32 mg p.o. daily.  5. Advair 250/50 one puff b.i.d.  6. Spiriva 18 mcg inhaled daily.  7. Coreg 6.25 mg b.i.d.   SOCIAL HISTORY:  She is married, lives in [place] with her husband.  Used to smoke, but quit in 1998.  Denies any alcohol or illicit drug  use.   REVIEW OF SYSTEMS:  Negative except as described in the history of  present illness.   PHYSICAL EXAM:  VITAL SIGNS:  Upon admission, blood pressure 184/87,  heart  rate 80, respirations 16, O2 sat 99% on room air, and temperature  98.2.  GENERAL:  She was alert, awake, oriented x3.  Did not appear to be in  acute distress.  HEENT:  Normocephalic, atraumatic.  Her pupils are equally reactive to  light and accommodation with intact extraocular movements.  NECK:  Supple with no JVD.  No lymphadenopathy.  No bruits or goiter.  LUNGS:  Clear to auscultation bilaterally.  HEART:  Regular rate and rhythm with no murmurs, rubs, or gallops  auscultated.  ABDOMEN:  Obese, soft, slightly tender to palpation diffusely.  No  rebound or guarding.  No Murphy sign.  Positive bowel sounds.  No masses  or organomegaly noted.  EXTREMITIES:  She had trace pedal edema with positive pulses.  NEUROLOGIC:  Grossly intact and nonfocal.   LABORATORY DATA:  Upon admission; sodium 139, potassium 3.0, chloride  102, bicarb 27, BUN 5, creatinine 0.90, Glucose of 106.  All of her LFTs  were  within normal limits.  WBC is 9.4, hemoglobin 13.3, platelets 406.  Her FOBT was negative.  Her UA was negative.  One set of cardiac enzymes  was negative with a CK of 148, CK-MB of 3.0, and a troponin of less than  0.01.  She had a chest x-ray that showed in no acute distress.  A CT  head with no acute findings and age-related atrophy.  She also had a CT  of the abdomen and pelvis that showed no acute abnormalities.   ASSESSMENT AND PLAN:  1. Diarrhea.  With her recent antibiotics, I question if she might      have a C. diff at this time.  We will admit to a private room and      place on contact.  We will check C. diff toxin in her stool.  We      will also check stool for fecal leukocytes, ova, and parasites, and      regular bacterial cultures.  2. Her dizziness is likely secondary to orthostasis secondary to      volume depletion.  We will check orthostatic vital signs and      replete IV fluids.  3. Her hypertension, [facility] has been consulted on the      Emergency  Department physician's request for assistance with      hypertension management and they have recommended increasing the      Coreg to 12.5 b.i.d. and increasing the hydrochlorothiazide at 25      mg daily.  4. For her hypothyroidism, we will check a TSH and continue on her      home dose of Synthroid.  5. For her hypokalemia, which is likely secondary to her severe      diarrhea, we will replete p.o. and check a magnesium level and see      if that needs to be repleted as well.  6. For hyperlipidemia, we will check an fasting lipid profile in the      morning and continue on her home medications.  7. For gastrointestinal prophylaxis, we will place on Protonix.  8. For DVT prophylaxis, we will place on subcutaneous heparin.      Peggye Pitt, M.D.  Electronically Signed     EH/MEDQ  D:  04/24/2008  T:  04/25/2008  Job:  [id number]

## 2011-01-01 NOTE — Assessment & Plan Note (Signed)
[facility]                          CHRONIC HEART FAILURE NOTE   [name], [name]                 MRN:          [id number]  DATE:02/17/2007                            DOB:          06-06-1930    [name] returns today for follow up of her congestive heart failure,  which is secondary to known ischemic cardiomyopathy.  I last saw Ms.  [name] as a new patient in June of this year.  At that time, Ms.  [name] complained of ongoing shortness of breath in the setting of  known COPD with increase fatigue.  At that time [name] was not on a  beta blocker secondary to increase asthma exacerbation.  However, Dr.  Gala Romney had noted in the past that he would like to resume her beta  blocker at some point.  When I saw [name] in June her blood  pressure was 139/67, manually 130/62.  I referred [name] to Dr.  Graciela Husbands for follow up of her previously implanted BiV ICD as she had not  seen him in greater than 1 year.  The patient was seen by Dr. Graciela Husbands on  June 24.  Dr. Graciela Husbands thought it would be beneficial to attempt beta  blocker therapy again. He discontinued patient's Norvasc and started her  back on the metoprolol at 2.5 mg daily. [name] states she has been  taking the beta blocker, continuous to complain of feeling washed out  and fatigued.  Denies any increase respiratory distress since beta  blocker therapy re-established.  When I saw [name] back in June I  actually walked her in the hallway  with the pulse ox on and her  sats  were maintained in the high 90s.  However, she did experience extreme  dyspnea on exertion with minimal activity.  I also requested that  patient have a followup appointment with Dr. Delton Coombes over at Pulmonary.  Patient previously had seen Dr. Jayme Cloud.  I also referred [name]  for pulmonary function study. PFTs showed moderate obstructive lung  defect with significant response to bronchodilator,  results of which are  in  patient's chart.  I also obtained lab work on [name] at that  time.  Potassium 3.8, BUN and creatinine 13 and 1.4, TSH 2.11, BNP was  71.  [name] denies any episodes of palpitations, presyncope or  syncopal episodes.  However, she does complain of feeling nauseated,  dizzy and washed out approximately 1-2 hours after she takes her morning  meds.  I confirmed she does take her medicines with food, but always  experiences these symptoms within 2 hours after taking her medications.  She complains of tenderness over her ICD site and various  musculoskeletal discomfort.  Complaints of ongoing shortness of breath  and fatigue and poor sleep, otherwise no change in status.   PAST MEDICAL HISTORY:  1. Includes congestive heart failure secondary to nonischemic      cardiomyopathy with an EF now 60% to 65% from previous EF of 20%.  2. Asthma.  3. COPD.  4. Status post implantation of a biventricular ICD followed by  Dr.      Graciela Husbands.  5. Episodes of atypical chest pain.  6. History of hypertension.  7. History of hyperlipidemia.  8. Chronic renal insufficiency.  9. Intolerance to ACE-inhibitors secondary to history of cough.  10.Arthritis.  11.GERD.  12.Hypothyroidism.  13.History of hiatal hernia.  14.Chronic dyspnea.  15.Previous intolerance to BETA BLOCKER THERAPY, ACE -INHIBITORS,      STATINS.   REVIEW OF SYSTEMS:  As stated above.   CURRENT MEDICATIONS:  Include:  1. Zetia 10 mg daily.  2. Synthroid 0.075 mg daily.  3. Aciphex 20 mg daily.  4. Atacand 32 mg daily.  5. Tricor 145 daily.  6. Advair 250/50 mg bid.  7. Pravastatin 40 mg daily.  8. Spiriva inhaler daily.  9. Metoprolol 2.5 mg daily.   PHYSICAL EXAMINATION:  Blood pressure initially recorded at 176/80.  Manual blood pressure 130/88.  Patient states her blood pressure at home  this morning was 144/69, note she has taken her medicines this morning.  Weight 141, pulse 65.  Ms.  [name] is in no acute distress.  No jugular vein distention at 45 degree angle.  LUNGS:  She has coarse wheezing throughout with rhonchi.  CARDIOVASCULAR:  Reveals an S1 and S2.  Regular rate and rhythm.  ABDOMEN:  Soft, nontender, positive bowel sounds.  LOWER EXTREMITIES:  Without clubbing, cyanosis or edema.   IMPRESSION:  Heart failure without signs of volume overload at this  time.  Patient with Class II to early Class III symptoms with ongoing  dyspnea, most likely multifactorial.  The patient has not followed up  with pulmonary yet.  She has an appointment scheduled for July 30 to see  Dr. Delton Coombes.  In the interim I am going to go ahead and schedule her for a  overnight pulse oximetry with apneic link. I think that [name]  could benefit from O2 use q. nightly at home.  Will  continue her bisoprolol at current dose and see her back in 4 weeks.  Actually I am going to have her follow up with Dr. Gala Romney for re-  evaluation.     Dorian Pod, ACNP  Electronically Signed      [name], MD, [facility]  Electronically Signed   MB/MedQ  DD: 02/17/2007  DT: 02/17/2007  Job #: [id number]   cc:   Lucky Cowboy, M.D.

## 2011-01-01 NOTE — Assessment & Plan Note (Signed)
[facility]                            CARDIOLOGY OFFICE NOTE   [name], [name]                 MRN:          [id number]  DATE:12/17/2006                            DOB:          1930-03-03    PRIMARY CARE PHYSICIAN:  Lucky Cowboy, M.D.   INTERVAL HISTORY:  [name] is a very pleasant 77 year old woman with  a history of congestive heart failure secondary to non-ischemic  cardiomyopathy with an ejection fraction that is now normalized at 60-  65%. She is status post bi-VICD. She also has a history of COPD,  hypertension, hyperlipidemia, and chronic renal insufficiency. For  complete problem list, please see my note of August 05, 2006. She is  here for routine followup.   Apparently, she had a COPD flare a little while back and her beta-  blocker was stopped. She also had an episode where she was not feeling  well and she had her ICD checked which showed nine mode switches, but  there was no evidence of arrhythmias and it seemed to be working  properly. She says this is now better. Overall, she says is doing well.  She denies any significant dyspnea. No chest pain. Her main complaint is  that she has had recurrent pain in her tailbone from a fall more than 10  years ago, which is really beginning to bother her now.   CURRENT MEDICATIONS:  1. Zetia 10 mg a day.  2. Synthroid 75 mcg a day.  3. Aciphex 20 a day.  4. Atacand 32 a day.  5. Tricor 145 a day.  6. Advair.  7. Spiriva.  8. Pravastatin 40 a day.   ALLERGIES:  ARE TO WELCHOL, VYTORIN, CRESTOR, LOPID, LIPITOR, AND ACE  INHIBITORS.   PHYSICAL EXAMINATION:  She is an elderly woman in no acute distress.  Ambulates around the clinic without any respiratory difficulty. Blood  pressure is 124/70, heart rate 80, weight is 140.  HEENT: Normal.  NECK: is supple. No JVD. Carotids are 2+ bilaterally without bruits.  There is no lymphadenopathy, thyromegaly.  CARDIAC: Is a  regular rate and a rhythm. No murmurs, rubs. There is an  S4. [name]  LUNGS:  She has decreased breath sounds throughout with no wheezing.  ABDOMEN: Soft, nontender, nondistended. No hepatosplenomegaly. No  bruits. No masses. Good bowel sounds.  EXTREMITIES: Are warm with no cyanosis, clubbing or edema.  NEURO: She is alert and oriented x3. Cranial nerves II-XII are grossly  intact. Moves all 4 extremities without difficulty. Affect is pleasant.   ASSESSMENT/PLAN:  1. Congestive heart failure. She is doing well from this standpoint      despite her chronic obstructive pulmonary disease. I think it would      be nice to get her back on a beta-blocker if possible. She has      tolerated this well in the past and we will try to resume      bisoprolol at 2.5 mg a day. I told her to contact us if she has any      wheezing.  2. Hypertension, well-controlled.  3. Hyperlipidemia, followed by  Dr.  Oneta Rack.   DISPOSITION:  Will see her back in 4-6 months for routine followup.     Bevelyn Buckles. Bensimhon, MD     DRB/MedQ  DD: 12/17/2006  DT: 12/17/2006  Job #: [id number]   cc:   Lucky Cowboy, M.D.

## 2011-01-01 NOTE — Assessment & Plan Note (Signed)
[facility]                             PULMONARY OFFICE NOTE   Amy, [name]                 MRN:          [id number]  DATE:05/27/2007                            DOB:          15-Sep-1929    SUBJECTIVE:  [name] is a 77 year old woman with COPD that is in the  mild to moderate range.  She also has diastolic dysfunction and a  history of pulmonary edema.  Most recently we have been evaluating her  for nocturnal hypoxemia.  Her polysomnogram confirmed obstructive sleep  apnea and we sent her for a CPAP auto-titration which was done on  May 22, 2007, and we are here to review the results today. Also,  since our last visit I treated her for a possible mild exacerbation of  her with corticosteroids and azithromycin.  She had significant  improvement after that intervention and is now back to her previous  baseline.  She does still cough frequently in the mornings but her  breathing is doing fairly well.  She was able to pull weeds this week  although she did get out of breath.  She has been seeing Dr. Charlotte Sanes at  [facility] ophthalmology and is being prepared for laser eye surgery  which will be done under conscious sedation but does not require general  anesthesia.   CURRENT MEDICATIONS:  1. Zetia 10 mg daily.  2. Synthroid 0.075 mg daily.  3. Aciphex 20 mg daily.  4. Atacand 32 mg daily.  5. TriCor 145 mg q.h.s.  6. Advair 250/50 one inhalation b.i.d.  7. Pravastatin 40 mg daily.  8. Spiriva 18 mcg one inhalation daily.  9. Coreg 6.25 mg 1/2 tablet b.i.d.  10.Vitamin D 1000 international units t.i.d.  11.Pro-Air 2 puffs q.4 hours p.r.n. for shortness of breath.  12.Benadryl p.r.n.   PHYSICAL EXAMINATION:  GENERAL:       This is a pleasant elderly woman  who is in no distress on room air. She does cough frequently during our  exam.  VITAL SIGNS:  Her weight is 140 pounds, temperature 98.2, blood pressure  154/82, heart rate 78,  SPO2 97% on room air.  HEENT EXAM:  Posterior pharynx has some mild erythema.  NECK:  Supple without lymphadenopathy or stridor, I do not hear a bruit.  LUNGS:  Are distant, clear during a normal respiratory cycle but she  does have wheezing on a forced expiration.  HEART:  Distant but regular, I do not hear a murmur.  ABDOMEN:  Benign.  EXTREMITIES:  No clubbing, cyanosis or edema.  NEUROLOGIC:  She has a nonfocal exam.   IMPRESSION:  1. Moderate chronic obstructive pulmonary disease with a recent      exacerbation which is now resolved.  2. Obstructive sleep apnea - she has had a CPAP titration and I am      waiting for the final interpretation of these results.  Once these      are available I will order nocturnal CPAP for the patient.  She      agrees to try using the mask although she acknowledges that she had  difficulty tolerating it when she had her titration study.  3. History of a nonischemic cardiomyopathy as well as diastolic      dysfunction status post implantation of a biventricular implantable      cardioverter defibrillator.  4. Possible secondary pulmonary hypertension.  5. Hypertension.  6. Hypothyroidism.   PLANS:  1. Continue her bronchodilators as ordered which include Spiriva,      Advair and p.r.n. albuterol.  2. CPAP titration results will be obtained and I will start nocturnal      CPAP.  3. She does not need oxygen with exertion based on our ambulatory      oximetry that was performed from July of 2008.  4. With regard to Ms. Line's planned eye surgery, she should not      require general anesthesia and although she does have moderate      COPD, I do not believe this should be a contraindication to      undergoing the procedure.     Leslye Peer, MD  Electronically Signed    RSB/MedQ  DD: 05/27/2007  DT: 05/27/2007  Job #: [id number]   cc:   Michel Harrow, MD  Lucky Cowboy, M.D.

## 2011-01-01 NOTE — Assessment & Plan Note (Signed)
[facility]                            CARDIOLOGY OFFICE NOTE   Amy, WIEDERHOLT                 MRN:          [id number]  DATE:04/17/2007                            DOB:          1930/08/05    PRIMARY CARE PHYSICIAN:  [name], M.D.   INTERVAL HISTORY:  [name] is a very pleasant 77 year old woman with  a history of congestive heart failure secondary to nonischemic  cardiomyopathy.  Previous EF was 25-30%.  Most recent EF was 60%.  She  is status post BiV ICD.  She also has a history of significant COPD, as  well as hypertension, hyperlipidemia, and chronic renal insufficiency.  She has been intolerant of many Statins, as well as ACE inhibitors.  She  returns today for routine followup.  Overall, she says she is doing very  well.  She had her pacemaker adjusted in June and thinks this has given  her more energy.  She denies any chest pain.  She has not had any  significant shortness of breath.  No orthopnea, PND, or lower extremity  edema.  She is struggling with the fact that her husband has recurrent  prostate cancer.  She has been following with Dr. Delton [name] for her lung  disease.  She has also been checking her blood pressures at home, and  for the most part these have been elevated with systolics ranging in the  130-140 range.  There were some that were mildly lower.   PHYSICAL EXAM:  She is in no acute distress.  Ambulates around the  clinic without any respiratory difficulty.  Blood pressure is 182/82, on manual recheck 170/64.  Heart rate is 75.  HEENT:  Normal.  NECK:  Supple.  No JVD.  Carotids are 2+ bilaterally with bilateral  bruits.  There is no lymphadenopathy or thyromegaly.  CARDIAC:  Distant heart sounds.  PMI is not displaced.  She has a  regular rate and rhythm with no obvious murmur.  LUNGS:  Decreased air movement throughout with no wheezing or rales.  ABDOMEN:  Soft, nontender, nondistended.  There is no  hepatosplenomegaly.  No bruits.  No masses.  Good bowel sounds.  EXTREMITIES:  Warm with no cyanosis, clubbing, or edema.  NEURO:  Alert and oriented x3.  Cranial nerves 2-12 are intact.  Moves  all 4 extremities without difficulty.  Affect is pleasant.   ASSESSMENT:  1. Congestive heart failure with recovered ejection fraction.  She is      doing quite well.  No evidence of volume overload.  2. Hypertension.  Blood pressure is markedly elevated.  We will stop      her bisoprolol and start her on Coreg 6.25 b.i.d. and continue to      titrate as tolerated.  We will also check a renal ultrasound to      rule out renal artery stenosis.   DISPOSITION:  Return to clinic in several months for routine followup.     Bevelyn Buckles. Bensimhon, MD  Electronically Signed    DRB/MedQ  DD: 04/17/2007  DT: 04/18/2007  Job #: [id number]   cc:  [name], M.D.

## 2011-01-01 NOTE — Assessment & Plan Note (Signed)
[facility]                             PULMONARY OFFICE NOTE   Amy, [name]                 MRN:          [id number]  DATE:03/18/2007                            DOB:          May 02, 1930    HISTORY OF PRESENT ILLNESS:  Patient is a 77 year old Caucasian woman  with past medical history significant for hypertension, previous  myocardial infarction, diastolic dysfunction status post AICD placement  in 2006 by Amy. Graciela Husbands, COPD, asthma, hyperlipidemia, questionable sleep  apnea, presenting as consultation secondary to increased dyspnea that  has slowly been progressing over the last several years. Patient  currently complains of increased shortness of breath with activity as  well as at rest. She also reports increasing cough recently with yellow  sputum production. Patient also complains of some sternal chest pain  secondary to coughing. Patient also notes some difficultly swallowing as  well as headaches. Of note patient recently started on bisoprolol on  February 17, 2007 per cardiology. Patient had overnight oximetry done on February 25, 2007 which showed approximately 10 episodes of desaturation with low  desaturation being down to 86%. These 10 episodes were short in length.  Patient does not believe that she has ever had a sleep study. Patient  also had pulmonary function tests done on January 27, 2007 at [facility] which revealed PFTs showed a moderate obstructive lung defect,  it also showed a significant response to bronchodilator. Indicates maybe  an asthma component.   PAST MEDICAL HISTORY:  Includes; hypertension, previous myocardial  infarction, heart failure, asthma, emphysema, high cholesterol,  questionable sleep apnea.   PAST SURGICAL HISTORY:  Includes cholecystectomy in 1997, neck surgery  in 1974, hysterectomy 1975, right breast biopsy was also done.   ALLERGIES:  INCLUDE LESCOL, VYTORIN, CRESTOR, LOPID, LIPITOR, ACE  INHIBITORS, ASPIRIN CAUSES UPSET STOMACH.   SOCIAL HISTORY:  Patient has an 80-pack-year history. She quit  approximately 10 years ago. She denies any alcohol or drugs. She is  currently married. She lives with her husband. She used to work in [employer] and has since retired. She denies any recent travel  history.   FAMILY HISTORY:  Her mother had emphysema and TB as a child. Mother also  had asthma, and heart disease, as well as a brain tumor.   MEDICATIONS:  Include:  1. Zetia 10 mg p.o. daily.  2. Synthroid 0.075 mg daily.  3. Aciphex 20 mg daily.  4. Atacand 32 mg p.o. daily.  5. Tricor 145 mg p.o. at bedtime.  6. Advair 250/50 b.i.d.  7. Pravastatin 40 mg p.o. daily.  8. Spiriva daily.  9. Bisoprolol 15 mg half a tablet daily, which was recently started on      July 1.  10.P.r.n. albuterol inhaler.  11.Benadryl p.r.n.  12.ProAir p.r.n.   PHYSICAL EXAMINATION:  Weight 139, temperature 98.6, blood pressure  142/80, pulse 64, O2 sats 97% on room air. Of note patient was ambulated  on room air with after 3 laps a heart rate of 78, and O2 sats 96% on  room air.  GENERALLY: Patient is in  no acute distress.  HEENT: No oral pharyngeal erythema.  NECK: Supple with no lymphadenopathy. No JVD.  LUNGS: Clear to auscultation bilaterally. No wheezes, rhonchi, or rales  noted.  CV: S1, S2 regular rate and rhythm. No murmurs, rubs, or gallops.  EXTREMITIES: No significant ankle edema noted.  ABDOMEN: Soft, nontender, positive bowel sounds.   ASSESSMENT/PLAN:  Increasing dyspnea, patient's increased dyspnea is  likely multi factorial including her problems with congestive heart  failure, chronic obstructive pulmonary disease, questionable pulmonary  hypertension, as well as a questionable asthma component. Patient was  walked on room air and had no desaturations. At this point we will order  a polysomnogram to see how patient does when she is sleeping. Patient  did not appear  to have any problems with snoring or possible obstructive  sleep apnea, but we will further evaluate this with a sleep study. We  will continue patient's current medications at this time including her  beta blocker which is not likely to be causing her symptoms although  this can not be ascertained for sure. Patient is to follow up in 1 month  time to review the results of her sleep study. Patient will also have a  chest x-ray done today. I appreciate this consult.   ATTENDING PHYSICIAN ADDENDUM:  I have examined the patient, reviewed her data and discussed the case  with [name].  She has multifactorial dyspnea.  I believe COPD is a  contributor, and she is on a good bronchodilator regimen.  I suspect she  may have secondary pulmonary hypertension due to her left-sided heart  disease, her COPD, and also possibly from OSA (given her nocturnal  desaturations).  We will evaluate with PSG, insure adequate oxygenation  at rest and with exertion, and I will review her TTE with Amy [name] to  see if suspicion for pulmonary hypertension exists.  If so, she may  eventually require R heart cath.     [name], M.D.      Amy Peer, MD  Electronically Signed   Milas Gain  DD: 03/18/2007  DT: 03/19/2007  Job #: [id number]   cc:   Amy Rotunda, MD, [facility]

## 2011-01-01 NOTE — Discharge Summary (Signed)
[name], [name]          ACCOUNT NO.:  [id number]   MEDICAL RECORD NO.:  [id number]          PATIENT TYPE:  INP   LOCATION:  5526                         FACILITY:  [facility]   PHYSICIAN:  Hillery Aldo, M.D.   DATE OF BIRTH:  November 09, 1929   DATE OF ADMISSION:  04/24/2008  DATE OF DISCHARGE:  05/04/2008                               DISCHARGE SUMMARY   PRIMARY CARE PHYSICIAN:  Lucky Cowboy, MD   DISCHARGE DIAGNOSES:  1. Acute on chronic diarrhea.  2. Abdominal bloating.  3. Mild gastroparesis.  4. Hypertension.  5. Normocytic anemia.  6. Chronic obstructive pulmonary disease/asthma.  7. Acute gout.  8. Cardiomyopathy with history of biventricular implantable      cardioverter-defibrillator placement.  9. Dyslipidemia.  10.Hypothyroidism.  11.Hypokalemia.  12.Stage II chronic kidney disease.  13.Sigmoid diverticulosis.   DISCHARGE MEDICATIONS:  1. Atacand 32 mg daily.  2. Hydrochlorothiazide 12.5 mg one half tablet daily.  3. Fenofibrate 134 mg daily.  4. Aciphex 20 mg daily.  5. Pravastatin 40 mg daily.  6. Carvedilol increased to 12.5 mg b.i.d.  7. Synthroid 75 mcg daily.  8. Fluticasone propionate 2 puffs in each nostril at bedtime.  9. Benadryl 25 mg q.4 h. p.r.n.  10.Advair 250/50 one inhalation b.i.d.  11.Spiriva one capsule daily.  12.Zetia 10 mg daily.  13.Reglan 5 mg q.6 h. p.r.n. abdominal bloating.  14.Colchicine 0.6 mg daily p.r.n. gout.   CONSULTATION:  1. Bevelyn Buckles. Bensimhon, MD  2. Bernette Redbird, MD   BRIEF ADMISSION HISTORY OF PRESENT ILLNESS:  The patient is a 77-year-  old female who presented to the hospital with chief complaint of  diarrhea, dizziness, and elevated blood pressure to 187/105.  She was  admitted for further evaluation and workup.  For the full details,  please see the dictated report done by Dr. Ardyth Harps.   PROCEDURES AND DIAGNOSTIC STUDIES:  1. CT of the head on April 24, 2008, showed no acute intracranial  findings.  Age-related cerebral atrophy and periventricular white      matter disease.  2. Chest x-ray on April 24, 2008, showed no focal acute      cardiopulmonary process.  3. Left femur films on April 24, 2008, showed no focal acute      osseous findings.  4. CT scan of the abdomen and pelvis on April 24, 2008, showed no      evidence of acute abnormalities.  Sigmoid diverticulosis without      evidence of diverticulitis.  5. Upper endoscopy on April 28, 2008, was normal.  6. Colonoscopy on April 28, 2008, showed severe diverticula and      fixation with inability to advance the scope beyond approximately      30 cm.  7. Barium enema attempted on April 29, 2008:  Limited exam which      included only to the level of the distal sigmoid colon because the      patient was unable to tolerate the exam and hold the contrast.      There is marked diverticular disease of the sigmoid colon.  8. Gastric emptying study on May 02, 2008, showed mildly delayed      gastric emptying with 44% retention of meal of 120 minutes (normal      retention less than 30%).   DISCHARGE LABORATORY VALUES:  Sodium was 137, potassium 4.8, chloride  97, bicarb 32, BUN 15, creatinine 1.22, glucose 93.  White blood cell  count was 9.4, hemoglobin 11.5, hematocrit 34.1, and platelets 402.   HOSPITAL COURSE BY PROBLEM:  1. Severe diarrhea with abdominal bloating, nausea, vomiting, and      anorexia:  The patient underwent various diagnostic evaluations      with findings as noted above.  Clostridium difficile toxin analysis      was negative.  Fecal lactoferrin studies were negative.      Antigliadin antibodies were negative.  Giardia and Cryptosporidium      studies were negative.  Transglutaminase studies were negative.      The patient did undergo upper endoscopy and a limited colonoscopy      as well.  Biopsies from the upper endoscopy revealed normal small      bowel mucosa with  normal villous architecture and no objective      increase in inflammation.  No villous atrophy, active inflammation,      or other significant change identified.  Essentially, her workup      was negative.  Felt that the patient may have irritable bowel      syndrome and given her mild gastroparesis, she was treated with      Reglan as needed.  Her symptoms gradually improved over the course      of her hospital stay and at this time, no further GI evaluation is      recommended.  She will be sent home with a prescription for Reglan      to use on an as-needed basis only.  2. Hypertension:  The patient's blood pressure was markedly elevated      on initial evaluation.  Her antihypertensive regimen was adjusted      with an increase in her Coreg.  Her hydrochlorothiazide was      increased as well, but in the setting of an acute gout flare, this      has been reduced back to her preadmission doses.  3. Normocytic anemia:  The patient did have an anemia panel done in      the course of her hospital evaluation, which was entirely normal.      She likely has some element of bone marrow fibrosis from her      advanced age and no further diagnostic workup was undertaken.  Her      hemoglobin and hematocrit have remained stable.  4. Chronic obstructive pulmonary disease/asthma.  The patient's      respiratory status was stable throughout her hospital stay.  5. Cardiomyopathy:  The patient had no active decompensated heart      failure during her hospital stay.  6. Dyslipidemia:  The patient's lipids were adequately treated.  A      lipid profile was done on April 25, 2008, it showed a total      cholesterol of 157, triglyceride 75, HDL 38, and LDL 104.  7. Hypothyroidism:  The patient was continued on her usual dose of      Synthroid.  Her TSH was normal at 0.772 indicating appropriate      replacement.  8. Hypokalemia:  The patient was repleted.  9. Gout:  The patient did develop classic  left  great toe swelling      consistent with gout.  She was treated with an one-time dose of      steroids and colchicine with improvement.  Her hydrochlorothiazide      dose was decreased back to her preadmission doses.  10.Stage II chronic kidney disease:  The patient's creatinine has      remained stable throughout her hospital stay.  She should follow up      with her primary care physician for ongoing care.   DISPOSITION:  The patient is medically stable for discharge; however,  she just got some disturbing news about the impending death of a nephew  and family has requested to hold her overnight and they deal with the  family issues.  She will therefore be discharged in the morning.      Hillery Aldo, M.D.  Electronically Signed     CR/MEDQ  D:  05/03/2008  T:  05/03/2008  Job:  [id number]   cc:   Lucky Cowboy, M.D.

## 2011-01-01 NOTE — Assessment & Plan Note (Signed)
[name]                            CARDIOLOGY OFFICE NOTE   [name], [name]                 MRN:          [id number]  DATE:06/23/2007                            DOB:          02-08-1930    PRIMARY CARE PHYSICIAN:  Lucky Cowboy, M.D.   PULMONOLOGIST:  Leslye Peer, MD.   INTERVAL HISTORY:  [name] is a very pleasant 77 year old woman with  a history of congestive heart failure secondary to nonischemic  cardiomyopathy.  Previous EF was 25-30%.  She is status post BiV ICD and  her most recent EF was 60%.  She also has a history of significant COPD  as well as hypertension, hyperlipidemia and chronic renal insufficiency.  She has been intolerant of many Statins as well as ACE inhibitor.   She returns today for routine follow-up.  She was recently found to have  nocturnal hypoxemia as well as probable sleep apnea.  She was started on  oxygen and CPAP.  She was having a very difficult time tolerating her  CPAP.  She has also been having problems with sinus infections.   Overall, she feels like she has been up and down.  Some days she is  short of breath and sometimes it is some better.  There has not been any  chest pain, no orthopnea, no PND.  Occasional lower extremity edema.   CURRENT MEDICATIONS:  1. Zetia 10 mg a day.  2. Synthroid 75 mcg a day.  3. AcipHex 20 mg a day.  4. Atacand 32 mg a day.  5. Tricor 145.  6. Advair 250/50 b.i.d.  7. Norvasc 10.  8. Pravastatin 40.  9. Spiriva.  10.Bisoprolol.  11.Coreg 3.125 b.i.d.  12.Vitamin D.  13.CPAP.   PHYSICAL EXAMINATION:  VITAL SIGNS:  Blood pressure 150/80, heart rate  75, weight 139.  GENERAL APPEARANCE:  She is in no acute distress.  She ambulates around  the clinic without any respiratory difficulty.  She does have some sinus  congestion with a cough.  HEENT:  Normal.  NECK:  Supple.  There is no JVD.  Carotids are 2+ bilaterally with a  soft left bruit.   There is no lymphadenopathy or thyromegaly.  LUNGS:  Clear with some bronchitic breath sounds.  CARDIOVASCULAR:  She had distant heart sounds, she is regular with an  S4, no murmur.  ABDOMEN:  Soft, nontender, nondistended.  There is no  hepatosplenomegaly, no bruits, no mass.  EXTREMITIES:  Warm with no clubbing, cyanosis, or edema. No rash.  NEUROLOGIC:  Alert and oriented x3.  Cranial nerves II-XII intact.  Moves all four extremities without difficulty.  Affect is pleasant.   ASSESSMENT/PLAN:  1. History of nonischemic cardiomyopathy. Ejection fraction has      recovered.  Her volume status looks good.  Continue current      therapy.  2. Chronic obstructive pulmonary disease and obstructive sleep apnea      followed by Dr. Delton Coombes.  3. Hypertension.  Blood pressure is elevated today.  Will go ahead and      start her on hydrochlorothiazide 12.5 mg once a  day and check a      potassium in one week.  She will follow up with Dr. Oneta Rack.  4. Hyperlipidemia, per Dr. Oneta Rack.   DISPOSITION:  From a cardiac standpoint she is doing quite well.  She is  stable.  I will see her back in nine months for routine follow-up.   Of note, she did have a carotid ultrasound which showed 0-39% blockages  bilaterally.  We can follow this up in two years.     Bevelyn Buckles. Bensimhon, MD  Electronically Signed    DRB/MedQ  DD: 06/23/2007  DT: 06/24/2007  Job #: [id number]   cc:   Lucky Cowboy, M.D.  Leslye Peer, MD

## 2011-01-01 NOTE — Assessment & Plan Note (Signed)
[facility] [facility]                         ELECTROPHYSIOLOGY OFFICE NOTE   [name]                 MRN:          [id number]  DATE:01/19/2008                            DOB:          [birth year]    [name] is seen in follow-up for congestive heart failure in the  setting of nonischemic heart disease and is status post CRT  implantation.  She has also has significant interval improvement of her  ejection fraction so that it is nearly normalized.  She continues to  have problems with dyspnea and has known significant COPD.   She has hypertension, dyslipidemia and mild renal insufficiency; but not  withstanding all of these things, is doing really pretty well.  Her  biggest issue being her recent toenail removal surgery secondary to a  fungus infection.   MEDICATIONS:  Currently include  1. Pravastatin.  2. Fenofibrate.  3. Hydrochlorothiazide 12.5.  4. Coreg 3.125 b.i.d.  5. Spiriva.  6. Atacand.  7. Synthroid.   PHYSICAL EXAMINATION:  VITAL SIGNS:  I, unfortunately, do not have a  recorded blood pressure.  Her heart rate was 65.  LUNGS:  Her lungs were clear.  NECK:  Her neck veins were flat.  HEART:  Her heart sounds were regular.  EXTREMITIES:  Extremities had no edema.   Interrogation of her [employer] ICD demonstrates P-wave of 3.5 with  impedance of 456, threshold of 1 volt at 0.2 in both the RA and the RV,  the impedance was 672 in the RV, 664 in the LV.  The R-wave was 9.7.  The LV threshold 1.5 at 0.5.  Battery voltage 3.04.  She has 6949 lead  in.   IMPRESSION:  1. Nonischemic cardiomyopathy with congestive heart failure.  2. Status post CRT-D for the above (MDT)  3. A 6949 lead.   [name] is doing really very well at this point.  Her congestive  symptoms are stable.   We will have her come back to see Dr. Gala Romney in six months' time.  I  will see her in one year and perhaps he and I can alternate seeing  her  every six months.     Duke Salvia, MD, [facility]  Electronically Signed    SCK/MedQ  DD: 01/19/2008  DT: 01/19/2008  Job #: [id number]   cc:   Lucky Cowboy, M.D.

## 2011-01-01 NOTE — Assessment & Plan Note (Signed)
[facility]                          CHRONIC HEART FAILURE NOTE   Amy, [name]                 MRN:          [id number]  DATE:01/23/2007                            DOB:          May 26, 1930    [name] is a patient who is new to our Heart Failure Clinic.  She  has previously been seen here in the Heart Failure Clinic in the past.  She is a very pleasant 77 year old Caucasian female with congestive  heart failure secondary to known ishemic cardiomyopathy.  Previous  systolic function of 25% to 30% in 2005.  Most recent echocardiogram  done in November 2007 shows a left ventricular ejection fraction of 60%  to 65%.  [name] is also status post biventricular/CRT device  followed by Dr. Berton Mount.  She has a Medtronic Maximo 7304 implanted  in March 2006.  Since that time, she also underwent AV optimization, and  has also underwent a CPX test in 2006.  Also, it showed a peak VO2 of  10.8 mL per kg per minute with a peak RAR of 0.85, a VE/VCO2 slope of  38.7, and a VE/MVV ratio of 50% consistent with heart failure  limitation.  [name] also has a history of COPD previously followed  by Dr. Danice Goltz.  [name] today is being seen secondary to  recent episodes of hypotension.  She saw Dr. Gala Romney, her primary  cardiologist, in April, at which time she was started on bisoprolol 2.5  mg a day.  Since initiation of her beta blocker therapy, she has  complained of lightheadedness, dizziness.  She is feeling washed out,  increased fatigue, increased shortness of breath.  She has had 3 asthma  attacks since that time, and has had to use her albuterol inhaler more.  At home, she has checked her blood pressure when she feels lightheaded  and dizzy.  She states it is running top number 80 to 90 over 40 to 50  since starting bisoprolol.  I was asked to see [name] today after  she called our office at the end of last week  reporting increased  fatigue.  Apparently, blood pressure at that time was 86/55.  Patient  also had a device interrogation that same day.  Interpretation of  interrogation not available at this time.  [name] has also  complained of increased palpitations over the last week, and some chest  discomfort under her right breast.  She describes it as a cramping  sensation in her ribcage that takes her breath away.  She states the  sensation is also worse if she takes a deep breath.  When I was asked  about [name] on January 19, 2007, in regards to her hypotension, and  feeling of increased fatigue, I requested that her bisoprolol that be  discontinued.  Information was relayed to patient by Amber with EP.  Ms.  [name] states she is feeling much better since the bisoprolol was  discontinued.  Her blood pressure at home now is running 106 to 126, and  the diastolic 68 to 70s.  She checks her  blood pressure every day and  records it.   PAST MEDICAL HISTORY:  Includes:  1. Congestive heart failure secondary to nonischemic cardiomyopathy      with an EF now improved to 60% to 65% from previous EF of 20%.  2. Asthma.  3. COPD.  4. Status post implantation of a biventricular ICD followed by Dr.      Berton Mount.  5. Intolerance to beta blocker therapy.  6. Intolerance to ACE inhibitor therapy.  7. Intolerance to statins.  8. Episodes of atypical chest pain.  9. Hypertension.  10.Hyperlipidemia.  11.Chronic renal insufficiency.  Last creatinine 1.69 in March 2008.  12.History of cough on angiotensin converting enzyme inhibitor.  13.Arthritis.  14.GERD.  15.Hypothyroidism.  16.History of hiatal hernia.  17.Chronic dyspnea, most likely multifactorial.   REVIEW OF SYSTEMS:  As stated above.   CURRENT MEDICATIONS:  Include:  1. Zetia 10 mg daily.  2. Synthroid 0.075 mg daily.  3. Aciphex 20.  4. Atacand 32 mg.  5. Tricor 145.  6. Advair 250/50 inhaled b.i.d.  7. Norvasc 10 mg  daily.  8. Pravastatin 40 mg daily.  9. Spiriva inhaled once daily.   Her p.r.n. medications include albuterol inhaler q.4 p.r.n.   ALLERGIES/INTOLERANCES:  1. LESCOL.  2. VYTORIN.  3. CRESTOR.  4. LOPID.  5. LIPITOR.  6. ACE INHIBITORS.  7. BETA BLOCKERS.   PHYSICAL EXAMINATION:  Weight 139.  Blood pressure initially 139/67,  manual 130/62 with a heart rate of 79.  Twelve-lead EKG showed  ventricular pacing at a rate of 77.  [name] is in no acute distress.  Very pleasant elderly female  without jugular vein distention at 45 degree angle.  LUNGS:  Decreased breath sounds without audible wheezing at this time.  CARDIOVASCULAR EXAM:  Reveals an S1 and S2.  Positive S4.  No murmurs or  rubs noted.  ABDOMEN:  Soft and non-tender.  Positive bowel sounds.  LOWER EXTREMITIES:  Without clubbing, cyanosis, or edema.  NEUROLOGIC:  Patient is alert and oriented x3.  Movement of extremities  x4.  Ambulating without assistance.   CLINICAL DATA:  Patient ambulated greater than 300 feet with pulse  oximetry.  Pulse oximetry on room air ranged from 97% to 98%.  Heart  rate 78 to 90.  Note, patient had to stop frequently to take deep  breaths.  She was also doing some pursed lip breathing.  Complained of  mild dizziness.  No chest pain while ambulating.   IMPRESSION:  Congestive heart failure secondary to nonischemic  cardiomyopathy.  Patient has undergone biventricular ICD implantation  with optimization in 2006.  Results as stated above.  [name]  continues to complain of ongoing fatigue, lightheaded and dizziness, not  as severe off beta blocker therapy.  Asthma flare ups stabilized once  beta blocker discontinued.  I believe that patient could benefit from  pulmonary reevaluation.  She feels like she is having to use her  albuterol more frequently, even after the beta blocker was discontinued. Previously, she was followed by Danice Goltz, however, will no longer  be  practicing in this area.  I would like patient to follow up with Dr.  Delton Coombes as a new patient to him.  I am going to check BMET and a BNP on  patient today.  EKG without significant change.  Will also check [employer]  reading on patient for thoracic impedance.  We will go ahead and order a  pulmonary function test with DLCO,  and have patient seen by Dr. Delton Coombes at  his earliest convenience.  I am also going to have patient follow up  with Dr. Graciela Husbands, as she has not seen him in approximately 1 year, here in  the office.  I will see patient back after these evaluations.      Dorian Pod, ACNP  Electronically Signed      [name], MD, [facility]  Electronically Signed   MB/MedQ  DD: 01/23/2007  DT: 01/23/2007  Job #: [id number]   cc:   Lucky Cowboy, M.D.

## 2011-01-01 NOTE — Assessment & Plan Note (Signed)
[facility]                                 ON-CALL NOTE   GRISELLE, RUFER                   MRN:          [id number]  DATE:04/23/2008                            DOB:          March 03, 1930    I received a page through the answering service from Ms. Starzyk.  She  states her blood pressure is elevated and she has been nauseated,  vomiting with diarrhea all night, but her blood pressure being elevated,  this making her lightheaded and dizzy and she states just awful all  over.  I instructed Ms. Juarez to go ahead and come into the emergency  room to get evaluated.  She states she would have her husband bring her.      Dorian Pod, ACNP  Electronically Signed      Bevelyn Buckles. Bensimhon, MD  Electronically Signed   MB/MedQ  DD: 04/25/2008  DT: 04/25/2008  Job #: [id number]

## 2011-01-01 NOTE — Assessment & Plan Note (Signed)
[facility]                            CARDIOLOGY OFFICE NOTE   [name], [name]                 MRN:          [id number]  DATE:07/20/2008                            DOB:          Mar 15, 1930    PRIMARY CARE PHYSICIAN:  Lucky Cowboy, MD.   PULMONOLOGIST:  Leslye Peer, MD.   INTERVAL HISTORY:  [name] is a 77 year old woman with a history of  congestive heart failure, secondary nonischemic cardiomyopathy, previous  EF was 25-30%.  She is status post BIV/ICD.  Most recent ejection  fraction was 60%.  She also has a history of COPD, hypertension,  hyperlipidemia and chronic renal insufficiency.  She has been intolerant  of many statins as well as ACE inhibitors.   She returns today for routine followup.  She was recently in the  hospital with severe diarrhea.  This has gotten better.  Since  discharge, she has been having problems with her blood pressure which  has been elevated.  She has also been having occasional wheezing.  She  denies any chest pain.  No orthopnea.  No PND.  No significant lower  extremity edema.   CURRENT MEDICATIONS:  1. Pravastatin 40 a day.  2. Fenofibrate.  3. Prilosec 20 a day.  4. MiraLax.  5. Align.  6. Fluticasone nasal spray.  7. Synthroid 75 mcg a day.  8. Atacand 30 two a day.  9. Advair 250/50 b.i.d.  10.Spiriva.  11.Coreg 3.125 b.i.d.   PHYSICAL EXAMINATION:  GENERAL:  She is an elderly woman in no acute  distress.  Ambulates around the clinic slowly without any respiratory  difficulty.  VITAL SIGNS:  Blood pressure is 130/90, heart rate 66, weight is 137.  HEENT:  Normal.  NECK:  Supple.  No JVD.  Carotids are 2+ bilaterally without any bruits.  There is no lymphadenopathy or thyromegaly.  LUNGS:  Clear with diminished breath sounds throughout and mild end-  expiratory wheezing.  CARDIAC:  PMI is nondisplaced.  She has very distant heart sounds with  an S4.  No obvious murmur.  ABDOMEN:  Soft, nontender, nondistended.  No hepatosplenomegaly, no  bruits, no mass.  EXTREMITIES:  Warm with no cyanosis, clubbing or edema.  No rash.  NEURO:  Alert and oriented x3.  Cranial nerves II-XII are intact.  Moves  all 4 extremities  without difficulty.  Affect is pleasant.   ASSESSMENT/PLAN:  1. History of nonischemic cardiomyopathy.  This has resolved.  She is      unable tolerate higher doses of beta blocker due to her wheezing.      Continue current therapy.  2. Chronic obstructive pulmonary disease.  Followed by Dr. Delton Coombes.  She      does have some mild wheezing today.  3. Hypertension.  Blood pressure is elevated.  We will start her back      on Norvasc at 5 mg a day.  She will follow up with Dr. Oneta Rack.  4. Carotid artery stenoses.  These are mild, 0-39% bilaterally.  We      are going to followup in a couple of years.  DISPOSITION:  We will see her back in 1 year for routine followup.   ADDENDUM:  [name] has also been complaining of some left neck pain.  Her neck muscles on that side seem quite stiff.  I suggested that if  this is not getting any better, she should follow up with orthopedics.  She has not had fevers.     Amy Buckles. Bensimhon, MD  Electronically Signed    DRB/MedQ  DD: 07/20/2008  DT: 07/20/2008  Job #: [id number]

## 2011-01-01 NOTE — Op Note (Signed)
[name], [name]          ACCOUNT NO.:  [id number]   MEDICAL RECORD NO.:  [id number]          PATIENT TYPE:  INP   LOCATION:  5526                         FACILITY:  [facility]   PHYSICIAN:  Bernette Redbird, M.D.   DATE OF BIRTH:  1930-06-13   DATE OF PROCEDURE:  04/28/2008  DATE OF DISCHARGE:                               OPERATIVE REPORT   PROCEDURE:  Flexible sigmoidoscopy (attempted colonoscopy).   INDICATION:  A 77 year old female with low abdominal cramps and  intermittent diarrhea that has been going on for a long time.   FINDINGS:  Sigmoid diverticulosis.  Fixation of the colon making me  unable to advance scope beyond 30 cm.   PROCEDURE:  The nature, purpose, and risks of the procedure had been  discussed with the patient who provided written consent.  This procedure  was done immediately following her upper endoscopy, and total sedation  for the 2 procedures was fentanyl 70 mcg and Versed 6 mg IV.  There was  no problem with clinical instability.   The Pentax pediatric video colonoscope was advanced to about 30 cm into  the sigmoid region, whereupon further advancement was made impossible  due to severe fixation and angulation of the colon.  It felt as though  the scope was simply lock with attempts of further advancement, and  there also appeared to be a moderate amount of colonic spasm.  We turned  the patient into the supine position with the hope that this would  facilitate advancement, but it really did not help.  After trying for  several minutes at this location, it was felt that further attempts  would be fruitless and possibly dangerous, so pullback was performed.  Up to the limit of the exam, the quality of the prep was excellent, and  I did not see any mucosal abnormalities such as AVMs or colitis, nor any  lesion such as polyps or cancer.  She did have extensive diverticulosis.  Retroflexion in the rectum and re-inspection of the rectum were  unremarkable.  No biopsies were obtained.   IMPRESSION:  1. Sigmoid diverticulosis.  2. Unable to advance scope beyond 30 cm due to severe fixation.  3. No endoscopically-evident source of abdominal symptoms encountered.   PLAN:  Barium enema tomorrow.           ______________________________  Bernette Redbird, M.D.     RB/MEDQ  D:  04/28/2008  T:  04/29/2008  Job:  [id number]   cc:   Lucky Cowboy, M.D.

## 2011-01-01 NOTE — Assessment & Plan Note (Signed)
[name]                             PULMONARY OFFICE NOTE   [name], [name]                 MRN:          [id number]  DATE:04/27/2007                            DOB:          15-Dec-1929    SUBJECTIVE:  [name] is a 77 year old woman with multifactorial  dyspnea related to COPD and diastolic heart failure.  She also has  documented nocturnal hypoxemia and we have questioned a possible  diagnosis of obstructive sleep apnea.  She returns today after  completing a polysomnogram on April 13, 2007.  She tells me that she  has been a bit more active but she also finds that she has been using  her albuterol more frequently.  She has been requiring it about 2 times  daily, previously she had only used it rarely.  Her episodes of dyspnea  seem to occur with exertion.  She has had more nasal congestion over the  last several days with some associated cough and postnasal drip.  Her  daily cough pattern has been productive of a yellowish phlegm.  She  believes that this may have increased some over the last several weeks.   Her allergy regimen is primarily Benadryl p.r.n.  She has not tolerated  Claritin or Zyrtec in the past.   CURRENT MEDICATIONS:  1. Zetia 10 mg daily.  2. Synthroid 0.075 mg daily.  3. Aciphex 20 mg daily.  4. Atacand 32 mg daily.  5. TriCor 145 mg q.h.s.  6. Advair 250/50, one inhalation b.i.d.  7. Pravastatin 40 mg daily.  8. Spiriva 1 inhalation daily.  9. Coreg 3.125 mg b.i.d.  10.Albuterol 2 puffs q.4 h. p.r.n.  11.Benadryl p.r.n.   PHYSICAL EXAMINATION:  GENERAL:  This is a pleasant elderly woman in no  distress.  VITAL SIGNS:  Her weight is 139 pounds, temperature is 98.1, blood  pressure 164/82, heart rate 70, SpO2 96% on room air.  HEENT:  She has some mild posterior pharyngeal erythema and some upper  airway secretions.  NECK:  Without stridor.  She is coughing throughout the exam.  LUNGS:  Somewhat  distant.  Clear on the left but with some mild focal  expiratory wheezing in the right mid lung zone.  HEART:  Regular without murmur.  ABDOMEN:  Benign.  EXTREMITIES:  Have no cyanosis, clubbing, or edema.   DATA:  Preliminary information on her polysomnogram is available.  This  shows that she had multiple hyponeas, but only 6 obstructive apneas  recorded.  The final interpretation is not yet available.   IMPRESSION:  1. Chronic obstructive pulmonary disease with possible mild      exacerbation.  2. Nocturnal hypoxemia.  3. Diastolic dysfunction and history of congestive heart failure.  4. Possible secondary pulmonary hypertension.  5. History implantation biventricular implantable cardioverter-      defibrillator.  6. Hypertension.  7. Hypothyroidism.   PLAN:  1. I will treat her with azithromycin 500 mg on day #1 and then 250 mg      subsequent days for 5 days, for a possible mild exacerbation of  COPD.  I will not give her prednisone at this time.  2. A chest x-ray will be performed today to rule out a focal      abnormality, given her asymmetric exam.  If she does have an early      pneumonia, then I will change her azithromycin to moxifloxacin.  I      will call her with chest x-ray results.  3. Start nocturnal oxygen at 2 liters per minute until her formal      polysomnogram read is obtained.  She may ultimately need CPAP if      she meets criteria for obstructive sleep apnea.  4. We will continue her Advair and Spiriva as ordered.  She will also      continue her albuterol p.r.n.  5. I will follow up with [name] in 1 month to decide regarding      CPAP.     Leslye Peer, MD  Electronically Signed    RSB/MedQ  DD: 04/27/2007  DT: 04/27/2007  Job #: [id number]   cc:   [name], MD, [facility]  Duke Salvia, MD, [facility]

## 2011-01-01 NOTE — Letter (Signed)
February 10, 2007    Lucky Cowboy, M.D.  [address], [address]  [place], [place] [address]   RE:  LEAANN, NEVILS  MRN:  [id number]  /  DOB:  Nov 20, 1929   Dear Annette Stable:   I hope this letter finds you well.  Doristine Johns came in today.  She has, as you know, nonischemic cardiomyopathy, congestive heart  failure, and is status post cardiac resynchronization therapy with  normalization of her left ventricular function.   She continues to have problems with shortness of breath, and she  recently underwent PFTs that demonstrated a moderate defect with  bronchodilator responsiveness.   When she last saw Nicholes Mango about 6 weeks ago, he tried to resume  her on a beta blocker.  Unfortunately, she got very, very tired and took  her blood pressure at home and noted it to be 84 systolic, so she  discontinued it.  Based on that I have decided to try an experiment with  your permission.  That is to stop her Norvasc and get her back on the  bisoprolol at 2.5 a day.  She is to follow up with Dr. Gala Romney here in  about 6 or 8 weeks, and you as previously scheduled, to let us know if  she had any problems with her blood pressure going too high or too low  on the above regimen.   Her other medications are notable for the Atacand, Zetia, Synthroid, and  inhalers as well as her statin.   PHYSICAL EXAMINATION:  Her blood pressure today was 143/76, her pulse  was 97.  Lungs were clear.  Heart sounds were regular.   Interrogation of her Medtronic Maximo ICD demonstrates the presence of a  6949 lead.  The P-wave was 3.3 with impedance of 472, a threshold of 1  volt at 0.2.  The R-wave was 9.3 with an impedance of 648, threshold of  1 volt at 0.2.  The LV impedance was 672 with a threshold of 1.5 at 0.8.  There was some diaphragmatic stimulation at 2.5 at 0.6.   IMPRESSION:  1. Nonischemic cardiomyopathy with non-normalization of left      ventricular function.  2. Status post  CRT contributing to #1.  3. Chronic obstructive pulmonary disease, moderate.  4. Hypertension with recent hypotension.   Bill, I hope the above plan is okay with you.  The goal is to get her on  a beta blocker,  a little bit if we can, and she may need to take low-  dose amlodipine with it, but obviously her low blood pressure was an  issue.    Sincerely,      Duke Salvia, MD, [facility]  Electronically Signed    SCK/MedQ  DD: 02/10/2007  DT: 02/10/2007  Job #: [id number]

## 2011-01-01 NOTE — Op Note (Signed)
NAMEJENEANE, [name]          ACCOUNT NO.:  [id number]   MEDICAL RECORD NO.:  [id number]          PATIENT TYPE:  INP   LOCATION:  5526                         FACILITY:  [facility]   PHYSICIAN:  Bernette Redbird, M.D.   DATE OF BIRTH:  1930-08-16   DATE OF PROCEDURE:  04/28/2008  DATE OF DISCHARGE:                               OPERATIVE REPORT   PROCEDURE:  Upper endoscopy with biopsies.   INDICATIONS:  This patient has had diarrhea, nausea, vomiting and low  abdominal cramps.  Rule out celiac disease, gastric outlet obstruction,  bile reflux.   FINDINGS:  Small hiatal hernia with partial Schatzki's ring.   PROCEDURE:  The nature, purpose, and risks of the procedure had been  discussed with the patient who provided written consent and was brought  in a fasted state from her hospital room to the endoscopy unit where  sedation was administered with fentanyl 40 mcg and Versed 3 mg IV  without clinical instability.  The Pentax video endoscope was passed  under direct vision, entering the esophagus without difficulty.  The  vocal cords and larynx could not be well seen due to the overlying  epiglottis.   The esophagus had a little bit of glycogenic acanthosis but was  otherwise normal.  No reflux esophagitis, free reflux, Barrett's  esophagus, varices infection or neoplasia were noted.   There was a partial Schatzki's ring at the squamocolumnar junction,  below which was a 2-cm hiatal hernia.   The stomach contained no significant residual and in particular no bile  reflux was noted.  No gastritis, erosions, ulcers, polyps or masses were  seen and a retroflexed view of the cardia was normal except for the  small hiatal hernia seen from its inferior aspect.   The pylorus, duodenal bulb and the second duodenum looked normal but I  obtained duodenal biopsies to help rule out celiac disease, in light of  the patient's problems with bloating and diarrhea.  The scope was then  removed  from the patient.  She tolerated the procedure well and there  were no apparent complications.   IMPRESSION:  Nausea, vomiting, diarrhea and low abdominal cramps without  obvious cause evident on current examination.   PLAN:  Await biopsy results and proceed to colonoscopic evaluation.           ______________________________  Bernette Redbird, M.D.    RB/MEDQ  D:  04/28/2008  T:  04/29/2008  Job:  [id number]   cc:   Lucky Cowboy, M.D.

## 2011-01-04 IMAGING — CR DG ABDOMEN ACUTE W/ 1V CHEST
3 series · 3 of 3 positions shown · non-contrast
Comparison: 01/16/2010 study.

CLINICAL DATA: History of follow-up after colostomy takedown.
History of nausea.  History of lower abdominal pain.  History of
chest pain and coughing.

ACUTE ABDOMEN SERIES (ABDOMEN 2 VIEW & CHEST 1 VIEW)

[w chest pa]
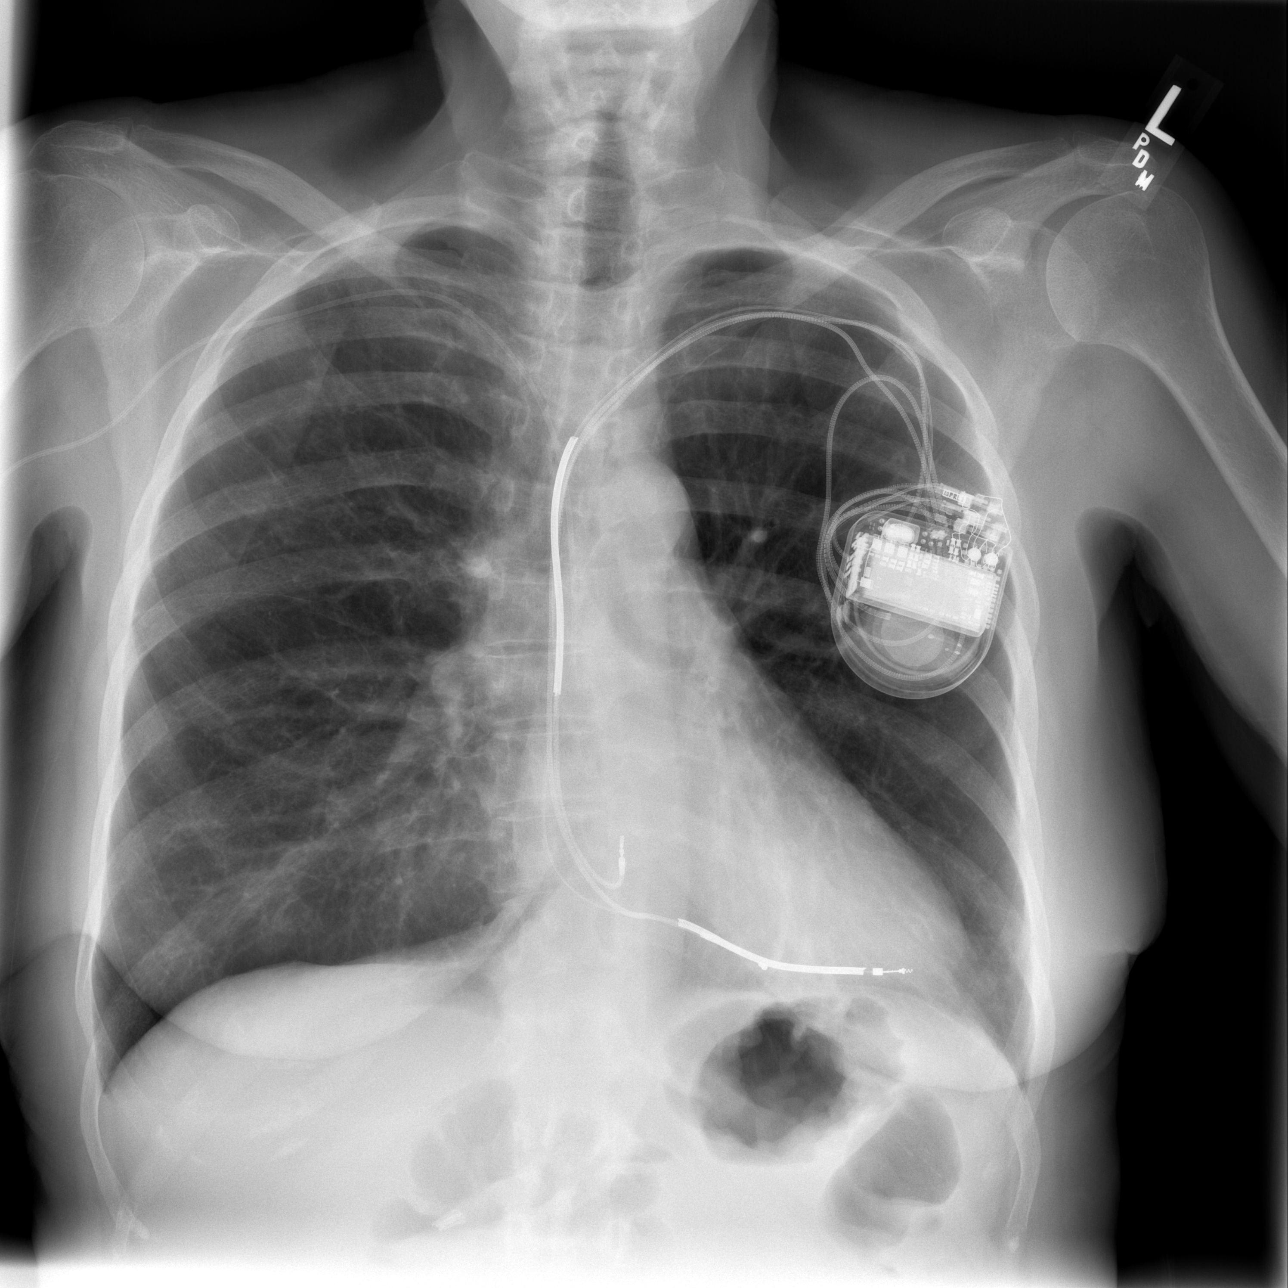

[w abdomen upright]
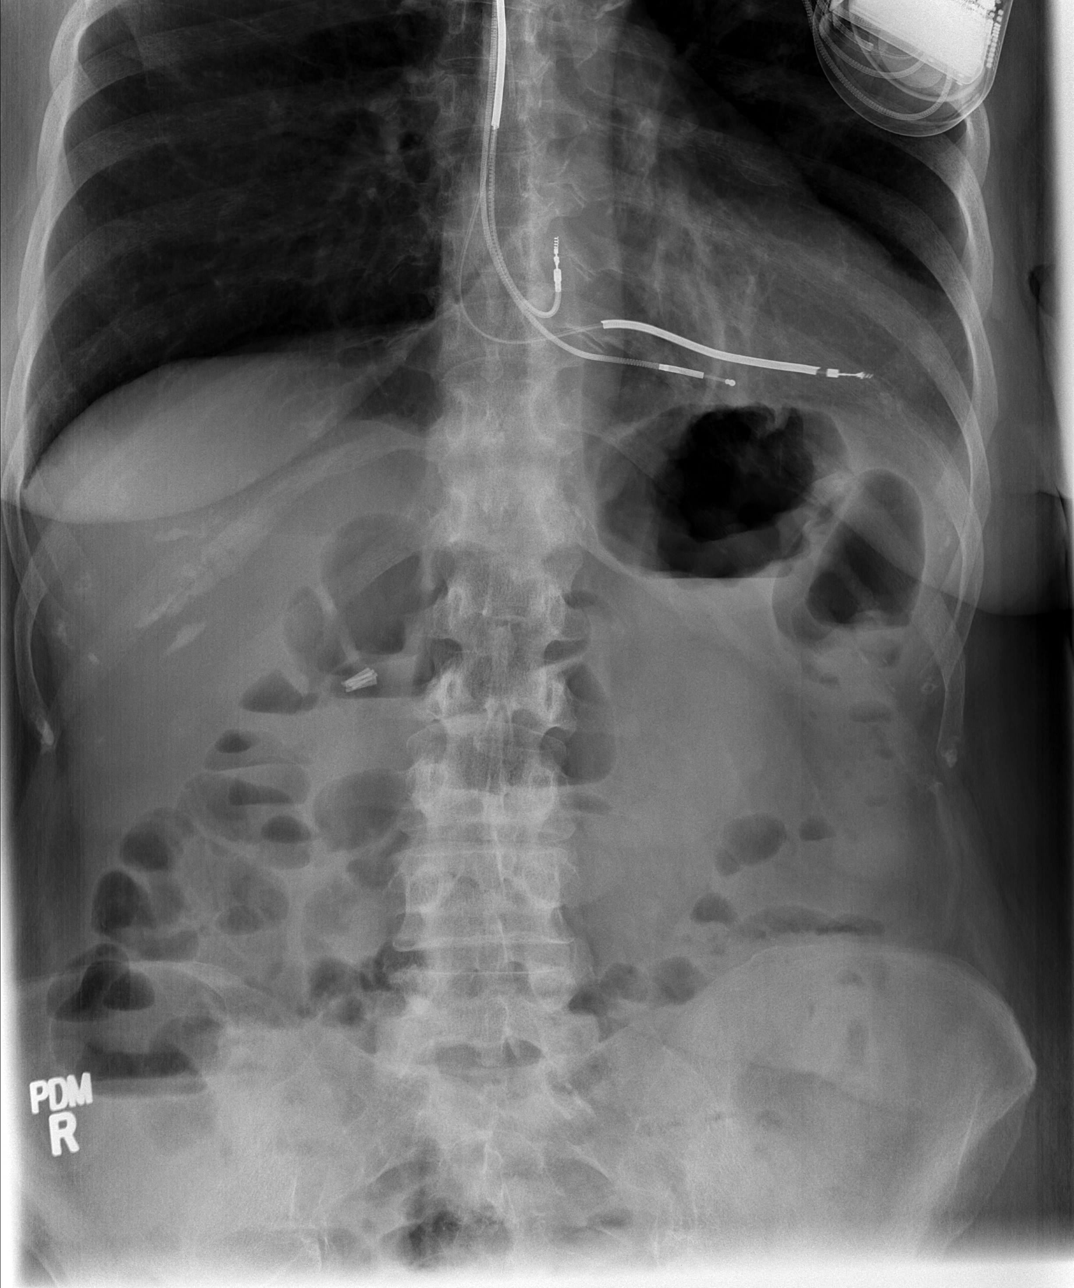

[t abdomen supine]
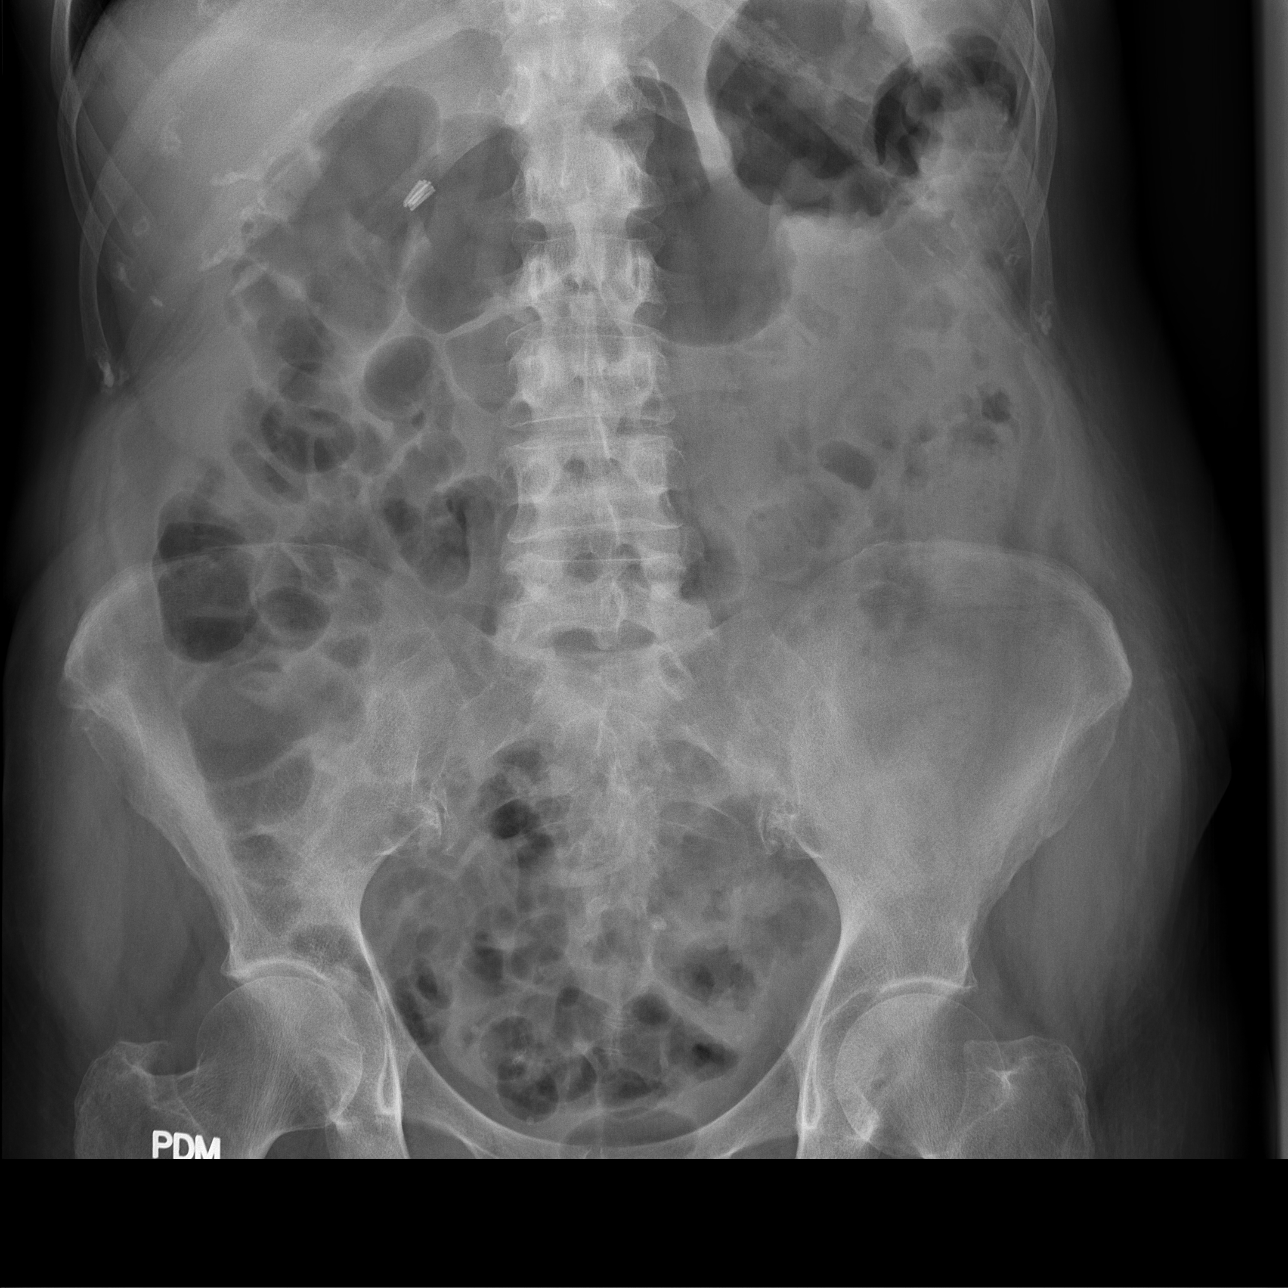

[3 of 3 positions shown; findings below may reference images not displayed]

FINDINGS: The cardiac silhouette is normal size and shape. Multiple
lead AICD in place with controller device on the left.  Interval
removal of enteric tube.  Right-sided PICC tip terminates in
proximal superior vena cava.  No pneumothorax is evident.  Lungs
are free of infiltrates.  No pleural effusion is seen.

No pneumoperitoneum is evident.  Cholecystectomy clips are seen.
The previous drainage tube in the pelvis has been removed.  Skin
staples have been removed.  Colon and rectal gas is present.  No
significant small-bowel dilatation is seen.  No opaque calculi are
seen. There is a mildly osteopenic appearance of the bones.
IMPRESSION: Interval removal of enteric tube and skin staples.  AICD in place.
Right-sided PICC in place.  No acute cardiopulmonary process is
seen.

No pneumoperitoneum is evident.  There is no evidence of bowel
obstruction.

## 2011-01-04 NOTE — Discharge Summary (Signed)
[name] NO.:  [id number]   MEDICAL RECORD NO.:  [id number]                   PATIENT TYPE:  INP   LOCATION:  5527                                 FACILITY:  [facility]   PHYSICIAN:  Danice Goltz, M.D. [facility]            DATE OF BIRTH:  03-11-1930   DATE OF ADMISSION:  09/26/2003  DATE OF DISCHARGE:  09/30/2003                                 DISCHARGE SUMMARY   DISCHARGE DIAGNOSES:  1. Ventilator-dependent respiratory failure secondary to community-acquired     pneumonia, no organisms specified.  2. Chronic obstructive pulmonary disease with asthmatic bronchitis.  3. Gastroesophageal reflux disease.  4. Debility.  5. Chronic constipation.   HISTORY OF PRESENT ILLNESS:  [name] is a 77 year old white  female, former smoker who quit approximately 1 year ago, who presented to  the emergency department in acute respiratory distress via the emergency  medical service.  At that time, it was found that she needed to be urgently  intubated by the ER physician, Dr. Wallace Cullens, at which time she was and pulmonary  critical care was asked to assume her care at that time secondary to the  fact she required the mechanical ventilatory support and critical care  admission.   LAB DATA:  Blood cultures are negative x2.  Tracheal aspirate demonstrates  gram-positive rod, gram-positive cocci.  Sodium 140, potassium 4.3, chloride  105, CO2 27, glucose 94, BUN 20, creatinine 1.2, calcium 8.4.  WBC is 17.5,  hemoglobin 12.4, hematocrit 36.4, platelets 357.  Arterial blood gases on  40% FIO2; pH of 7.40, pCO2 of 43, pO2 of 170, bicarb of 27.  INR 0.9, PTT is  27.   Chest x-ray shows endotracheal tube removed, improve aeration of bilateral  lower lobes.  Initial chest x-ray showed endotracheal tube near carina,  bilateral lower lobe pneumonia, right greater than left, slightly improved  on the right.   A 12-lead EKG shows normal EKG with a ventricular  rate of 80 beats per  minute.   HOSPITAL COURSE BY DISCHARGE DIAGNOSIS:  Discharge diagnosis #1:  Vent-  dependent respiratory failure secondary to community-acquired pneumonia, no  organism specified.  Patient presented to the emergency room at [facility] on September 26, 2003, and was found to be in acute respiratory  distress and was intubated urgently in the emergency room per the emergency  room physician.  She remained on mechanical ventilatory support  approximately 40 hours at which time she was successfully liberated from the  vent.  She did well post-extubation, was rapidly removed and transferred to  the floor where she has done well and is ready for discharge home on  September 30, 2003.  Of note, she may now change to albuterol and Atrovent  nebulizers four times a day rather than utilizing MDI puffers.  She will  remain on Avelox for another 6 days to complete 10 days of antibiotic  __________ therapy.  Discharge diagnosis #2:  Chronic obstructive pulmonary disease with  asthmatic bronchitis.  She has been on steroid taper and continued treatment  with her nebulizers and Acapella floater valve.   Discharge diagnosis #3:  Gastroesophageal reflux disease.  She was placed on  b.i.d. proton pump inhibitors with Protonix.   Discharge diagnosis #4:  Debility.  She has had increase in her activity.  Foley has been discontinued.  Her IV has been discontinued and she will  continue to rehabilitate in the home setting.   Discharge diagnosis #5:  Constipation, which has been resolved.   DISCHARGE MEDICATIONS:  1. Albuterol 2.5 and Atrovent 0.5 hand-held nebulizer 4 times a day.  2. Protonix 40 mg 2 times a day with meals.  3. Prednisone taper 30 mg for 3 days, 20 mg for 3 days, 10 mg for 3 days,     and then stop.  4. Avelox 400 mg a day until gone.  5. For cough she will have Robitussin-DM 5 mL every 12 hours.  6. She can utilize Synthroid at her former dose  daily.   DIET:  As tolerated.   SPECIAL INSTRUCTIONS:  She is not to take her Premarin any longer.  She will  have a follow up appointment with nurse practitioner on October 06, 2003,  at 2 p.m. and with Dr. Danice Goltz on October 21, 2003, at 2 p.m.  And to  follow up with Dr. Regino Schultze, her primary care physician at appropriate time  within a 2-week period.   DISPOSITION:  Acute respiratory failure requiring mechanical ventilatory  support has been resolved.  She is being discharged home in improved  condition.      Brett Canales Minor, A.C.N.P. [facility]                 Danice Goltz, M.D. [facility]    SM/MEDQ  D:  09/30/2003  T:  09/30/2003  Job:  [id number]   cc:   Kirk Ruths, M.D.  [address]  [place]  [place] [address]  Fax: [phone]

## 2011-01-04 NOTE — Assessment & Plan Note (Signed)
[facility]                            CARDIOLOGY OFFICE NOTE   [name], [name]                 MRN:          [id number]  DATE:10/28/2006                            DOB:          February 08, 1930    This is a very pleasant 77 year old white female, who saw Dr. Oneta Rack  yesterday because she had not been sleeping and was feeling poorly and  overall was not feeling well.  He did an EKG and thought her pacemaker  might not be working properly and referred her here.  Pacer ICD check:  She has had nine mode switches.  She has had no noted arrhythmias and it  seems to be working properly.  The patient actually states she feels  better today than she has felt in months.  He gave her a new medication  to sleep with and she slept ten hours straight and feels like a new  person.  She has had a couple of rough weeks with bronchitis and cough  and she says when she lies on her left side, she feels like her  defibrillator irritates her muscles and her left axillary area and she  becomes uncomfortable.  Overall, her breathing is fine.  She denies any  chest pain or palpitations.   CURRENT MEDICATIONS:  1. Zetia 10 mg daily.  2. Bisoprolol 10 mg daily.  3. Synthroid 0.075 mg daily.  4. AcipHex 20 mg daily.  5. Atacand 32 mg daily.  6. Tricor 145 mg q.h.s.  7. Advair 250/50 b.i.d.  8. Norvasc.  9. Pravastatin 40 mg daily.  10.Oxypam 10 mg daily.   PHYSICAL EXAM:  This is a pleasant, 77 year old white female, in no  acute distress.  Blood pressure 134/80, pulse 70, weight 143.  NECK:  Without JVD, HJR, bruit or thyroid enlargement.  LUNGS:  Decreased breath sounds without wheezing, rales or rhonchi.  HEART:  Regular rate and rhythm, 80 beats per minute.  Normal S1 and S2.  Positive S4.  No murmur heard.  ABDOMEN:  Soft, without organomegaly, masses, lesions or abnormal  tenderness.  EXTREMITIES:  Without cyanosis, clubbing or edema.  She has good  distal  pulses.   IMPRESSION:  1. Bi-V ICD working properly.  No evidence of it firing.  2. History of non-ischemic cardiomyopathy, but her ejection fraction      has now normalized.  3. Nonobstructive coronary artery disease on catheterization in 2005.  4. Hypertension.  5. Hyperlipidemia.  6. History of intolerance to ACE inhibitors, secondary to cough.  7. Chronic renal insufficiency, baseline creatinine 1.8.  8. Hypothyroidism.  9. Gastroesophageal reflux disease.   PLAN:  At this time, [name] seems to be stable from a cardiac  standpoint.  We will make no changes.  She has a followup appointment to  see Dr. Gala Romney in May.      Jacolyn Reedy, PA-C  Electronically Signed      [name], MD, [facility]  Electronically Signed   ML/MedQ  DD: 10/28/2006  DT: 10/30/2006  Job #: [id number]   cc:   Bevelyn Buckles. Bensimhon, MD

## 2011-01-04 NOTE — Op Note (Signed)
[facility]  Patient:    [name], [name]                 MRN: [id number] Adm. Date:  [id number] Attending:  Katha Cabal CC:         Marinus Maw, M.D.   Operative Report  CSS NUMBER:  [id number]  PREOPERATIVE DIAGNOSES: 1. Umbilical hernia that is painful. 2. Probable adenomyosis or cholesterol polyps, possibly small gallstones    involving the fundus of the gallbladder.  POSTOPERATIVE DIAGNOSIS:  PROCEDURES: 1. Laparoscopic cholecystectomy. 2. Repair of umbilical hernia.  SURGEON:  Thornton Park. Daphine Deutscher, M.D.  ASSISTANT:  Zigmund Daniel, M.D.  ANESTHESIA:  General endotracheal.  DESCRIPTION OF PROCEDURE:  [name] is a 77 year old lady who was taken to OR #11 and given general anesthesia.  The abdomen was prepped with Betadine and draped sterilely.  A curvilinear incision was made beneath the umbilicus and I dissected the umbilical hernia off and defined the fascial ring.  I put some temporary pursestring sutures in it and inserted the Hasson cannula.  The other ports were placed.  The omentum was stuck up to this hernia and this was taken down to free the anterior abdominal wall completely.  The gallbladder was grasped and elevated.  I dissected out the triangle of Calot.  A well-formed cystic duct was noted and cystic artery.  These were delineated and a clip placed up on the gallbladder and three clips were placed on the cystic artery.  The cystic artery was divided.  The cystic artery was opened and milked.  No stone or debris came back out from the common duct.  I therefore triple clipped the cystic duct, divided it, and removed the gallbladder from the gallbladder bed with the hook electrocautery.  There was a little bleeding up near the fundus which was controlled with the electrocautery, but no bowel leaks were noted.  The area was irrigated and observed.  The gallbladder was placed in a gallbladder bag and brought  out through the umbilicus.  Next, I deflated the abdomen and removed the Hasson cannula and did a vest over pants repair using three sutures of 2-0 Prolene, completely repairing the umbilical hernia in that fashion.  This was then observed from the inside with the scope under pressure and had a good repair present.  There was no leak and it was nice and solid.  I then deflated the abdomen and removed all of the trocars.  I tacked the inner aspect of the umbilicus down to the fascia and closed all of the incisions with 4-0 Vicryl. I also injected them all with 0.5% Marcaine.  The patient seemed to tolerate the procedure well.  She was taken to the recovery room in satisfactory condition. DD:  07/01/00 TD:  07/01/00 Job: [id number]

## 2011-01-04 NOTE — Assessment & Plan Note (Signed)
[facility]                            CARDIOLOGY OFFICE NOTE   KINLEIGH, NAULT                 MRN:          [id number]  DATE:08/05/2006                            DOB:          02-10-1930    PRIMARY CARE PHYSICIAN:  Lucky Cowboy, M.D.   PATIENT IDENTIFICATION:  [name] is a very pleasant 77 year old  woman who returns for routine followup.   PROBLEM LIST:  1. Congestive heart failure secondary to non-ischemic cardiomyopathy.      Ejection fraction now normalized.      a.     Most recent echo November 2007, ejection fraction of 60% to       65% with evidence of diastolic dysfunction. No significant       valvular abnormalities.      b.     Status post biventricular implantable cardiac defibrillator       (Bi-Vicad).  2. Chronic obstructive pulmonary disease.  3. Hypertension.  4. Hyperlipidemia.  5. Chronic renal insufficiency. Baseline creatinine 1.8.  6. History of INTOLERANCE TO ACE INHIBITORS SECONDARY TO COUGH.  7. Pre-syncope, resolved after discontinuation of hydrochlorothiazide.  8. Hypothyroidism.  9. Gastroesophageal reflux disease.   CURRENT MEDICATIONS:  1. Zetia 10.  2. Bisoprolol 10.  3. Synthroid 75 mcg a day.  4. AcipHex 20.  5. Atacand 32.  6. Tricor 145.  7. Advair 250/50.  8. Norvasc 10.  9. Pravastatin 40.  10.Augmentin.   INTERVAL HISTORY:  [name] returns today for routine followup. Over  the past 2 to 3 weeks, she has been struggling with sinus infection and  a persistent upper respiratory tract infection. She has had intermittent  fevers and productive sputum. She was seen by Dr. Kathryne Sharper office and  started on Augmentin. She has had some relief with this, but continues  to have a cough and feel poorly. She denies any significant lower  extremity edema. No orthopnea or PND. She does have some chest wall pain  from her cough.   PHYSICAL EXAMINATION:  She has a hacking cough. She is  uncomfortable,  but in no acute distress. Blood pressure is 158/75. The heart rate is  64. Weight is 142, which is stable.  HEENT: Sclerae anicteric. Periorbital areas are red and puffy. Her  conjunctivae are mildly injected. Oropharynx is clear.  NECK: is supple. No JVD. Carotids are 2+ bilaterally with a soft left-  sided bruit.  CARDIAC: A regular rate and a rhythm with an S4. No murmur.  LUNGS:  She has diffuse rhonchi and bronchial breath sounds with very  mild end-expiratory wheezing.  ABDOMEN: Soft, nontender, nondistended. No hepatosplenomegaly. No  bruits. No masses.  EXTREMITIES: Are warm with no cyanosis, clubbing or edema.  NEURO: She is alert and oriented x3. Cranial nerves II-XII are grossly  intact. She moves all 4 extremities without difficulty.   ASSESSMENT/PLAN:  1. Congestive heart failure. Her ejection fraction has recovered. She      is on an excellent medical regimen. Continue current therapy.  2. Carotid bruit. Recent ultrasound showed no significant hemodynamic      stenosis.  3.  Hypertension. Blood pressure is up today. However, this is in the      setting of severe upper respiratory tract infection. We will see      her back in a few months to continue to titrate her regimen as      needed.  4. Hyperlipidemia. This is followed by Dr. Oneta Rack.   DISPOSITION:  Return to clinic in 4 months. I have asked her to follow  up with Dr. Oneta Rack if her upper respiratory infection is not improving  in the near future.     [name]. Bensimhon, MD  Electronically Signed    DRB/MedQ  DD: 08/05/2006  DT: 08/05/2006  Job #: [id number]   cc:   Lucky Cowboy, M.D.

## 2011-01-04 NOTE — Discharge Summary (Signed)
[name] [name], [name] NO.:  [id number]   MEDICAL RECORD NO.:  [id number]          PATIENT TYPE:  INP   LOCATION:  6525                         FACILITY:  [facility]   PHYSICIAN:  Duke Salvia, M.D.  DATE OF BIRTH:  1930-01-20   DATE OF ADMISSION:  11/16/2004  DATE OF DISCHARGE:                                 DISCHARGE SUMMARY   ANTICIPATED DATE OF DISCHARGE:  November 17, 2004.   DISCHARGE DIAGNOSES:  1.  Discharge date status post implantation of Medtronic InSync Maximo with      left ventricular lead placement procedure based on the CARE-HF protocol.  2.  Syncope preceding inset of community acquired pneumonia; respiratory      arrest.  3.  Nonischemic cardiomyopathy, ejection fraction 25-30%.  4.  Holter monitor shows nonsustained ventricular tachycardia, but not      associated with symptoms such as syncope or presyncope.  5.  Class III congestive heart failure, CPX  Study indicating cardiac      limitation to effort.  6.  Left bundle branch block.   SECONDARY DIAGNOSES:  1.  Hypertension with possible orthostatic features,  2.  Dry cough with ACE inhibitors.  3.  Chronic obstructive pulmonary disease/asthmatic bronchitis.  4.  Osteoarthritis.  5.  Gastroesophageal reflux disease.  6.  Hypothyroidism.  7.  Hiatal hernia.  8.  Status post hysterectomy.  9.  Status post cholecystectomy.  10. Status post umbilical herniorrhaphy.  11. ___________ fusion.   PROCEDURE PERFORMED:  November 16, 2004 implantation of Medtronic InSync Maximo  cardioverter defibrillator with left ventricular lead placement, performed  by Dr. Sherryl Manges.  After a successful procedure the patient had no  adverse symptoms.  Postprocedurally was A sensing and V pacing.  The  incision is healing nicely without evidence of hematoma or ecchymosis.   The patient's chest x-ray has been reviewed and mobility of the left upper  extremity has been also been reviewed with the patient.   DISCHARGE MEDICATIONS:  The patient was discharged on the following  medications:  1.  Synthroid 75 mcg daily.  2.  Aciphex 20 mg daily.  3.  K-Dur 20 mEq daily.  4.  Lasix 20 mg daily.  5.  Zetia 10 mg daily.  6.  Atacand 16 daily.  7.  Advair 250/50 twice daily.  8.  Enteric coated aspirin 325 mg daily.  9.  Bisoprolol 7.5 mg daily.  10. Tri-Chlor 145 mg daily.  11. For pain Tylenol 325 mg 1-2 tabs every four to six hours as needed.   ACTIVITY:  Mobility has been discussed with the patient and she has been  given a mobility sheet.   DIET:  Discharge diet is low-sodium, low-cholesterol.   WOUND CARE:  The patient was asked to keep her incision dry for the next  seven days and to sponge bathe until Friday, April 07th.   FOLLOW UP:  Follow up appointments are all at [facility], [address].  1.  ICD Clinic Wednesday, December 05, 2004 at 9 o'clock in the morning.  2.  The patient will see Dr.  Graciela Husbands Thursday, February 14, 2005 at 1:45 in the      afternoon.   BRIEF HISTORY:  [name] is a 77 year old female.  She has a history of  nonischemic cardiomyopathy.  This was identified at the time she had a  respiratory arrest in the setting of community acquired pneumonia.  She  underwent subsequent left heart catheterization, which showed severely  decreased left ventricular function.  She is having persistent problems with  exercise intolerance; she can only walk 100 feet before becoming dyspneic.  She becomes fatigued with more than one to two hours of activity.  She has  nocturia dyspnea, orthopnea and peripheral edema.  She has worn a Holter  monitor, which demonstrated nonsustained ventricular tachycardia.  She feels  irregular tachy palpitations, but these last only minutes.  Episodes of  tachycardia are not associated with shortness of breath or lightheadedness.  Her last ejection fraction assessment in July 2005 showed an ejection  fraction of 25-30%.  The  patient also has left bundle branch block.   Overall this is a patient with nonischemic cardiomyopathy and would benefit  with ICD implantation, in addition, a left ventricular augmenting lead would  be appropriate.  The benefits and risks of this implantation have been  discussed with the patient and her family.  The patient will have CPX test  prior to proceeding.  Our impressions are based on the CARE-HF study.  We  plan to review the results of the CPX test and schedule implantation for  March 31st.   HOSPITAL COURSE:  The patient presented March 31st to [facility].  After a suitable period of preparation she underwent implantation of a  Medtronic cardioverter defibrillator with placement of a left ventricular  lead by Dr. Sherryl Manges.  As described above the patient has had no  postprocedure complications and will be discharged on postprocedure day  number one.  She is discharged on the medications and follow up as cited  above.  Once again, her ICD has been interrogated and chest x-ray has been  taken and examined.      GM/MEDQ  D:  11/16/2004  T:  11/17/2004  Job:  [id number]   cc:   Arvilla Meres, M.D. [facility]   [facility]   Lucky Cowboy, M.D.  [address], [address]  [place], [place] [address]  Fax: [phone]

## 2011-01-04 NOTE — Assessment & Plan Note (Signed)
[facility]                           ELECTROPHYSIOLOGY OFFICE NOTE   [name], [name]                 MRN:          [id number]  DATE:03/24/2006                            DOB:          [birth year]    [name] was seen in the clinic today on March 24, 2006, for follow-up of  her Medtronic model number [id number] Maximo.  Date of implant was November 16, 2004,  for nonischemic cardiomyopathy.  On interrogation of her device today, her  battery voltage is 3.16 with a charge time of 7.78 seconds.  P waves  measured 4.3 mV with an atrial capture threshold of 1 V at 0.2 msec and an  atrial lead impedance of 560.  R waves measured 10.6 mV with a right  ventricular pacing threshold of 1 V at 0.2 msec and a right ventricular lead  impedance of 632.  Left ventricular pacing threshold was 1 V at 0.9 msec and  1.5 V at 0.4 msec with a left ventricular lead impedance of 696.  Shock  impedance was 53.  There were no ventricular episodes since last  interrogation and 4 mode switches noted.  I did increase her left  ventricular output to 2.5 V at 0.6 msec and she will send a [facility]  transmission in at 3, 6 and 9 months' time with a return office visit in 1  year.                                   Altha Harm, LPN                                Duke Salvia, MD, [facility]   PO/MedQ  DD:  03/24/2006  DT:  03/25/2006  Job #:  [id number]

## 2011-01-04 NOTE — Op Note (Signed)
[name], [name]          ACCOUNT NO.:  [id number]   MEDICAL RECORD NO.:  [id number]          PATIENT TYPE:  AMB   LOCATION:  DAY                          FACILITY:  [facility]   PHYSICIAN:  Jamison Neighbor, M.D.  DATE OF BIRTH:  Jan 30, 1930   DATE OF PROCEDURE:  04/02/2005  DATE OF DISCHARGE:                                 OPERATIVE REPORT   PREOPERATIVE DIAGNOSIS:  Urgency/frequency syndrome, rule out interstitial  cystitis.   POSTOPERATIVE DIAGNOSIS:  Urgency/frequency syndrome, rule out interstitial  cystitis.   PROCEDURE:  1.  Cystoscopy.  2.  Urethral calibration.  3.  Hydrodistention of the bladder.  4.  Marcaine and Pyridium instillation.  5.  Marcaine and Kenalog injection.  6.  Bladder biopsy with cauterization.   SURGEON:  Dr. Logan Bores   ANESTHESIA:  General.   COMPLICATIONS:  None.   DRAINS:  None.   BRIEF HISTORY:  This 77 year old female has a severe case of urgency,  frequency, and urgency incontinence.  The patient has been under our care  for many years.  She has undergone hydrodistentions in the past which have  helped her would suggest that she might have IC; however, her bladder  capacity has always been fairly reasonable, and there was not a significant  number of glomerulations.  The patient has been treated often as if she has  an overactive bladder.  She was recently placed on  VEsicare which she  thought did work better for her than some of her other medications, but she  still has frequency.  She notes that on occasion, she goes over 30 times in  a 24 hour period.  The patient has requested that a repeat hydrodistention  be performed.  We have obtained cardiac clearance because the patient now  does have a pacemaker and defibrillator.  The defibrillator has been turned  off prior to the procedure and will be turned on afterwards.  The patient  gave full informed consent.   DESCRIPTION OF PROCEDURE:  After the successful induction of general  anesthesia, the patient was placed in the dorsolithotomy position and  prepped with Betadine, draped in the usual sterile fashion.  Careful  bimanual examination revealed a very shortened vagina following previous  surgery.  There is a little bit of a cystocele and a small rectocele, but  these are generally unremarkable.  The urethra was somewhat patulous but  otherwise normal with no evidence of stenosis or stricture, accepted a 9  [place] female urethral sound with no obstruction.  The cystoscope was  inserted into the bladder; the bladder was carefully inspected.  It was free  of any tumor or stones.  Both ureteral orifices were normal in configuration  and location.  Hydrodistention of the bladder was then performed, the  bladder ending up holding 800 mL under anesthesia.  There was a minimal  amount of glomerulations but a relatively unremarkable bladder.  The  slightly diminished capacity would suggest IC, but I think much of our  decision as to whether to treat her in an IC specific way will depend on her  response to the hydrodistention.  The bladder was drained.  A bladder biopsy  was performed.  The biopsy site was cauterized.  A mixture of Marcaine and  Pyridium was left within the bladder.  A mixture of Marcaine and Kenalog was  injected periurethrally for a pudendal block.  The had a small gauze pack  placed.  She was given a B&O suppository as well as intraoperative Toradol  and Zofran.  She will be sent home with Lorcet Plus, Pyridium Plus, and  Macrobid and will return to see me in the office in 2 weeks' time.           ______________________________  Jamison Neighbor, M.D.  Electronically Signed     RJE/MEDQ  D:  04/02/2005  T:  04/02/2005  Job:  [id number]   cc:   Lucky Cowboy, M.D.  [address], [address]  [place], [place] [address]  Fax: [phone]   Duke Salvia, M.D.  [phone] [address]  [address]  [place]  [place] [address]

## 2011-01-04 NOTE — Assessment & Plan Note (Signed)
[facility]                              CARDIOLOGY OFFICE NOTE   Amy, [name]                 MRN:          [id number]  DATE:04/16/2006                            DOB:          March 06, 1930    PRIMARY CARE PHYSICIAN:  Lucky Cowboy, M.D.   IDENTIFYING INFORMATION/JUSTIFICATION FOR ADMISSION AND CARE:  [name]  is a very pleasant 77 year old woman who returns today for routine followup.   PAST MEDICAL HISTORY:  1. Congestive heart failure secondary to systolic dysfunction. Ejection      fraction now normalized.      a.     Cardiac catheterization 2005 showed minimal coronary artery       disease.      b.     Blood pressure Bi-V ICD.  2. COPD.  3. Hypertension.  4. Hyperlipidemia.  5. History of intolerance to ace inhibitors secondary to cough.  6. Chronic renal insufficiency. Baseline creatinine 1.8.  7. Per-syncope, resolved after discontinuation of hydrochlorothiazide.  8. Hypothyroidism.  9. Gastroesophageal reflux disease.   CURRENT MEDICATIONS:  1. Zetia 10 daily.  2. Bisoprolol 10 mg daily.  3. Synthroid 75 mcg daily.  4. Aciphex 20 daily.  5. Atacand 32 daily.  6. Tricor 145 daily.  7. Advair 250/50 b.i.d.  8. Norvasc 10 daily.  9. Prilostatin 10 daily.   ALLERGIES:  LESCOL, VYTORIN, CRESTOR, LIPITOR, LIPID, ACE INHIBITORS.   HISTORY OF PRESENT ILLNESS:  [name] returns today for routine  followup. She has been battling with sinus and upper chest congestion for  several weeks now with a severe cough. She saw Dr. Oneta Rack and was given a Z-  pack, which has improved somewhat but not completely. She continues to have  few mild, low-grade fevers at about 99. She feels fatigued with this. She  denied orthopnea, PND, or lower extremity edema.   PHYSICAL EXAMINATION:  GENERAL:  She has a hacking cough, which is non-  productive. She appears fatigued.  VITAL SIGNS:  Blood pressure 126/72 with a heart rate of  62. Her weight is  144, which is down 3 pounds from previous.  HEENT:  Sclerae anicteric. EO MI. There is no xanthelasma. Moist mucous  membranes.  NECK:  Supple. No JVD. Carotids are 2+ bilaterally with a left bruit.  CARDIAC:  Regular rate and rhythm. No S3 or murmur.  LUNGS:  She has mild rhonchi and bronchial breath sounds in the right upper  lobe. Otherwise, clear.  ABDOMEN:  Soft, nontender, and nondistended. No hepatosplenomegaly. No  bruits and no masses.  EXTREMITIES:  Warm with no clubbing, cyanosis, or edema.  NEUROLOGIC:  Alert and oriented times three. Cranial nerves 2-12 are intact.  Moves all 4 extremities without difficulty.   ASSESSMENT/PLAN:  1. CONGESTIVE HEART FAILURE:  Secondary to non-ischemic myopathy with now      a normalized ejection fraction. She is doing quite well without any      events of overt heart failure. She is due for her yearly      echocardiogram.  2. PROBABLE BRONCHITIS, QUESTION RULE OUT PNEUMONIA:  Will get a  chest x-      ray today and start her on Avelox 400 daily. I have asked her to      followup with Dr. Oneta Rack if this is not getting better in a week or      two.  3. LEFT CAROTID BRUIT:  Check carotid ultrasound.  4. HYPERLIPIDEMIA:  This is followed by Dr. Oneta Rack.   DISPOSITION:  Return to clinic in 5 months for routine followup.                                Bevelyn Buckles. Bensimhon, MD    DRB/MedQ  DD:  04/16/2006  DT:  04/17/2006  Job #:  [id number]   cc:   Lucky Cowboy, MD

## 2011-01-04 NOTE — Cardiovascular Report (Signed)
[name], [name]                    ACCOUNT NO.:  [id number]   MEDICAL RECORD NO.:  [id number]                   PATIENT TYPE:  OIB   LOCATION:  6501                                 FACILITY:  [facility]   PHYSICIAN:  [name], M.D.                DATE OF BIRTH:  1930-03-07   DATE OF PROCEDURE:  03/21/2004  DATE OF DISCHARGE:                              CARDIAC CATHETERIZATION   PRIMARY:  Dr. Lucky Cowboy.   PROCEDURE:  Left and right heart catheterization/coronary arteriography.   INDICATION:  Evaluate patient with cardiomyopathy and a Cardiolite  suggesting previous infarct.   PROCEDURAL NOTE:  Left heart catheterization was performed via the right  femoral artery.  The artery was cannulated using an anterior wall puncture.  Right heart catheterization was performed via the right femoral vein.  This  vessel was also cannulated using an anterior wall puncture.  A #4-French  arterial sheath and a #7-French venous sheath were inserted via the modified  Seldinger technique.  Preformed Judkins, a pigtail and a Swan-Ganz catheter  were utilized.  The patient tolerated the procedure well and left the lab in  stable condition.   RESULTS:   HEMODYNAMICS:  RA mean 1, RV 36/1, PA 31/16, mean 20, pulmonary capillary  wedge pressure mean 10, cardiac output/cardiac index (Fick) 2.6/1.5, AO  169/73, LV 170/16.   CORONARIES:  The left main was normal.  The LAD had luminal irregularities.  There was a first diagonal which was small and normal.  There was a moderate-  sized mid-diagonal which was normal.  The circumflex in the A-V groove was  normal.  There was a very large mid obtuse marginal which was normal.  The  right coronary artery was dominant with a large PDA and acute marginal  branch was normal.   LEFT VENTRICULOGRAM:  A left ventriculogram was obtained the RAO projection.  The EF was difficult to estimate because of ventricular ectopy.  It appeared  to be  approximately 35% with global hypokinesis.   CONCLUSION:  Minimal coronary artery plaque.  Nonischemic cardiomyopathy  with a moderately reduced ejection fraction.   PLAN:  The patient will continue to have medication titration through the  heart failure clinic to try to optimize LV performance, reduce risks and  improve symptoms.                                               [name], M.D.    Derinda Sis  D:  03/21/2004  T:  03/21/2004  Job:  [id number]   cc:   Lucky Cowboy, M.D.  [address]  [place], [place] [address]  Fax: [phone]   Danice Goltz, M.D. [facility]

## 2011-01-04 NOTE — Op Note (Signed)
NAMECYENNA, REBELLO NO.:  [id number]   MEDICAL RECORD NO.:  [id number]          PATIENT TYPE:  OIB   LOCATION:  2899                         FACILITY:  [facility]   PHYSICIAN:  Duke Salvia, M.D.  DATE OF BIRTH:  Jun 03, 1930   DATE OF PROCEDURE:  05/15/2005  DATE OF DISCHARGE:  05/15/2005                                 OPERATIVE REPORT   PREOPERATIVE DIAGNOSIS:  Pocket tenderness, subcutaneous atrophy.   POSTOPERATIVE DIAGNOSIS:  Pocket tenderness, subcutaneous atrophy; no  evidence of purulent fluid.   PROCEDURE:  Pocket exploration, excision of a suture sleeve wing, and  reapposition of the subcutaneous tissue.   Following obtaining informed consent, the patient was brought to the  electrophysiology laboratory and placed on the fluoroscopy table in supine  position.  After routine prep and drape, cardiac catheterization was  performed with local anesthesia and conscious sedation.  Noninvasive  monitoring was performed continuously throughout the procedure.  Following  routine prep and drape, lidocaine was infiltrated along the line of the  previous incision.  An incision was made and carried down to the layer of  the device pocket using sharp dissection.  The pocket was opened.  No fluid  was evident.  The pocket was cultured with two different sets of swabs.  The  suture wing which was pointing out of the chest was excised.  Hemostasis was  obtained and the pocket was copiously irrigated with antibiotic containing  saline solution and then the subcutaneous tissue was reopposed with moderate  subcutaneous bites to try to bring in some extra tissue to overcome the  atrophy.  It was elected not to expand the pocket more inferiorly as there  was seen to be plenty of slack of the tissue the device generator.  The  cause of the atrophy is not clear to me unless it was related to the suture  wing sleeve.           ______________________________  Duke Salvia, M.D.     SCK/MEDQ  D:  05/15/2005  T:  05/15/2005  Job:  [id number]

## 2011-01-04 NOTE — Discharge Summary (Signed)
[name], [name] NO.:  [id number]   MEDICAL RECORD NO.:  [id number]          PATIENT TYPE:  OIB   LOCATION:  2899                         FACILITY:  [facility]   PHYSICIAN:  Duke Salvia, M.D.  DATE OF BIRTH:  1929/12/18   DATE OF ADMISSION:  05/15/2005  DATE OF DISCHARGE:  05/15/2005                                 DISCHARGE SUMMARY   ALLERGIES:  The patient has multiple allergies.  They are -  1.  LESCOL.  2.  LIPITOR.  3.  PLASTIC TAPE.  4.  VYTORIN.  5.  ACE-I INHIBITORS.  6.  CRESTOR.  7.  LOPID.  8.  DARVOCET.  9.  ZANTAC.  10. TAGAMET.  11. SKELAXIN.   DISCHARGE DIAGNOSES:  1.  Admitted for discomfort at IV ICD pocket.  2.  Discharging day of pacer pocket investigation with exploration culture      and excision of suture tip ---.  3.  The patient intimating that she has some symptoms of diaphragmatic      pacing.   SECONDARY DIAGNOSES:  1.  Implantation of Medtronic InSynch Maximo with left ventricular lead      placement based on the CARE-HF protocol.  2.  Syncope in the setting of community acquired pneumonia/respiratory      arrest.  3.  Nonischemic cardiomyopathy, ejection fraction of 25% to 30%.  4.  Holter monitor shows nonsustained ventricular tachycardia, but not      associated with symptoms such as syncope or presyncope.  5.  Class III congestive heart failure.  6.  Left bundle branch block.  7.  Hypertension.  8.  Dry cough with ACE inhibitors.  9.  Chronic obstructive pulmonary disease/asthmatic bronchitis.  10. Osteoarthritis.  11. Gastroesophageal reflux disease.  12. Hypothyroidism.  13. Hiatal hernia.  14. Status post hysterectomy, cholecystectomy, umbilical herniorrhaphy.  15. Spinal fusion.   PROCEDURE:  May 15, 2005 - Exploration of uncomfortable cardioverter  defibrillator pocket with culture and excision of suture tip ---.  There is  some subcutaneous atrophy noted at the pacer pocket site.  The patient  tolerated the procedure well, and was discharged on the same day.   MEDICATIONS:  She goes home on the following medications -  1.  Synthroid 75 mcg daily.  2.  AcipHex 20 mg daily.  3.  K-Dur 20 mEq daily.  4.  Zetia 10 mg daily.  5.  Advair 250/50 two puffs twice daily.  6.  Tricor 145 mg daily.  7.  Atacand 32 mg daily.  8.  Bisoprolol 10 mg daily.  9.  Norvasc 5 mg daily.  10. Lasix 2-3 times a week as needed.  11. Enablex 7.5 mg daily.  12. Albuterol metered dose inhaler two puffs every 4-6 hours as needed.  13. For pain, Tylenol 325 mg 1-2 tablets every 4-6 hours.  14. Enteric-coated aspirin 325 mg as needed.  15. She has a new medication for insomnia.  We have prescribed 1 mg - take 1      mg tablet at bedtime.  She has to be sure that she has 8 hours of time  to sleep.  She may take two tablets if the one tablet dose is      ineffective.  She is asked not to take this with food.   DISCHARGE DIET:  Low-sodium, low-cholesterol diet.   WOUND CARE:  She is asked to keep her incision dry for the next 7 days.  Sponge bathe until Wednesday, May 22, 2005.   FOLLOW UP:  She is the to follow up at the [facility] at [facility], [address] on Wednesday, May 29, 2005 at 10 o'clock  in the morning.   BRIEF HISTORY:  [name] is a 77 year old female.  She is status post CRT  implantation in March 2006.  Ever since that time, she has had discomfort  over the device at the pocket site.  She is unable to wear a bra.  She also  feels palpitations or a scratchy feeling in the left chest right lateral to  the left breast.  She says this has become noticeable in the weeks and  months after her device implantation.  The patient will present for device  revision.   HOSPITAL COURSE:  The patient presented electively on May 15, 2005.  She underwent revision of the device with exploration and culture of the  pocket tissues, and excision of a suture  ---.  The patient discharged on the  same day.  The incision looks well.  She is asked to take the bandage off on  post-procedure day #1, Thursday, May 16, 2005, and to leave it open to  the air.  She is also asked to keep it dry for the next 7 days.      Maple Mirza, P.A.    ______________________________  Duke Salvia, M.D.    GM/MEDQ  D:  05/15/2005  T:  05/16/2005  Job:  [id number]   cc:   Lucky Cowboy, M.D.  Fax: [phone]   [name], M.D.  [address]  [address]  [place]  [place] [address]

## 2011-01-04 NOTE — Op Note (Signed)
NAMEJENIYA, FLANNIGAN NO.:  [id number]   MEDICAL RECORD NO.:  [id number]          PATIENT TYPE:  INP   LOCATION:  2899                         FACILITY:  [facility]   PHYSICIAN:  Duke Salvia, M.D.  DATE OF BIRTH:  March 02, 1930   DATE OF PROCEDURE:  11/16/2004  DATE OF DISCHARGE:                                 OPERATIVE REPORT   PREOPERATIVE DIAGNOSES:  1.  Nonischemic cardiomyopathy.  2.  Left bundle branch block.  3.  Class III congestive heart failure with an abnormal CPX.   POSTOPERATIVE DIAGNOSES:  1.  Nonischemic cardiomyopathy.  2.  Left bundle branch block.  3.  Class III congestive heart failure with an abnormal CPX.   PROCEDURE:  Dual chamber defibrillator implantation with implantation of a  CRT device.   Following the obtaining of informed consent, the patient was brought to the  electrophysiology laboratory and placed on the fluoroscopic table in the  supine position.  After routine prep and drape of the left upper chest,  lidocaine was infiltrated in the prepectoral left subclavicular region.  An  incision was made and carried down to the layer of the prepectoral fascia  using electrocautery and sharp dissection.  A pocket was formed similarly.  Hemostasis was obtained.   Thereafter, attention was turned to gaining access to the extrathoracic left  subclavian vein which was accomplished without difficulty without the  aspiration of air or puncture of the artery.  Three separate venipunctures  were accomplished.  Guidewires were placed and retained.   Over the middle guidewire, a 7 French sheath was placed which was then  passed a Medtronic 6949 58 cm active fixation dual coil defibrillator lead,  serial [id number].  Under fluoroscopic guidance, it was manipulated to the  right ventricular apex where the bipolar R-wave was 10.8 with impedance of  993, a threshold of 0.8 volts at 0.5 msec, currented threshold of 1.0 MA.  There was no  diaphragmatic pacing at 10 volts.   At this point, over the more caudal guidewire, a 9 French sheath was placed  which was then passed a Medtronic NB2 coronary sinus cannulation catheter in  conjunction with a Wholey wire.  The coronary sinus was cannulated without  difficulty  and a posterolateral branch was identified on direct cannulation  which allowed for passage of a Medtronic 4194 88 cm passive fixation LV  lead.  It was moved out to a junction of the distal and mid third on a  very  lateral location.  The intraventricular interval was 70 msec.  Actually the  lead moved forward subsequently during the sheath splitting.  We pulled it  back and found a location where the intraelectrode spacing was at 70 msec  and in this location, the bipolar L wave was 19.9 mV with a pacing impedance  of 969 and a threshold of 0.6 and 0.5 with a currented threshold of 0.7 MA  and there was no diaphragmatic pacing at 10 volts.  The 9.5 French sheath  was removed.  The NB2 was left in place and over the last retained  guidewire, a 7 [place] sheath  was passed, through which was then passed a  Medtronic 5076 45 cm active fixation atrial lead, serial number [id number].  Under fluoroscopic guidance it was manipulated to the right atrial appendage  where the bipolar P-wave was 3 mV with pacing impedance of 701 ohms and  threshold of 1 volt at 0.5 msec, currented threshold of 1.8 MA.  This lead  was secured to the prepectoral fascia and the NB2 CS delivery sheath was  then removed with difficulties as noted above.  The leads were all secured  to the prepectoral fascia and then attached to a Medtronic InSync MAXIMO  7304 ICD serial number [id number].  Through the device, the bipolar P-wave  was 2.7 with impedance of 560, threshold of 1 volt at 0.2 msec.  The R-wave  was 11 with pacing impedance of 656 ohms and threshold of 1 volt at 0.2  msec.  The LV pacing impedance was 528 and threshold of 1.5 volts at   0.85msec.  There was no diaphragmatic pacing at 10 volts.  The high voltage  impedance was 62 ohms, proximal coil impedance was 64 ohms.   With these successful parameters recorded, the pocket was irrigated  copiously with antibiotic-containing saline solution.  Hemostasis was  assured.  The leads and the pulse generator were placed in the pocket and  secured in to the prepectoral fascia. Defibrillation threshold testing was  then undertaken.  Ventricular fibrillation was induced with a T-wave shock.  After a total duration of 6.5 seconds, a 20 joule shock through a measured  resistance of 46 ohms, terminated ventricular fibrillation and restoring  sinus rhythm.   After a wait of six minutes, ventricular fibrillation was reinduced via the  T-wave shock.  After a total duration of 5.5 seconds, a 15 joule shock was  delivered through a measured resistance of 46 ohms, terminating ventricular  fibrillation and restoring sinus rhythm.  At this point, the device was  implanted.  The pocket was closed in three layers in normal fashion.  The  wound was washed, dried and Benzoin and Steri-Strip dressing was applied.  Sponge, needle and instrument counts were correct at the end of the  procedure according to the staff.  Tolerated the procedure without apparent  complications.      SCK/MEDQ  D:  11/16/2004  T:  11/16/2004  Job:  [id number]   cc:   Electrophysiology Lab   [facility]   Lucky Cowboy, M.D.  [address]  [place], [place] [address]  Fax: [phone]

## 2011-01-04 NOTE — Assessment & Plan Note (Signed)
[facility]                              CARDIOLOGY OFFICE NOTE   [name], [name]                 MRN:          [id number]  DATE:06/24/2006                            DOB:          Jun 08, 1930    This is a 77 year old woman who comes in with chest pain and shortness of  breath.  She has a history of nonischemic cardiomyopathy and congestive  heart failure, but her ejection fraction has now normalized.  Cardiac cath  in 2005 showed minimal coronary artery disease and she has a blood pressure  BiV ICD.  She also has COPD.  Dr. Gala Romney saw her back in August, at which  time she was having trouble with bronchitis and was started on antibiotics.  Since that time, she says she has not felt well.  She says, when she walks  across her house she gets the sharp stabbing chest pain in the center of her  chest that sometimes goes to her back, and she gets out of breath.  She also  has some type of grabbing chest pain that is fleeting.  She has no heaviness  or pressure but mostly sharp, shooting pains that resolve spontaneously and  quickly.  She has no dizziness or pre-syncope.  She does continue to cough  quite a bit, but overall her bronchitis has resolved.   CURRENT MEDICATIONS:  1. Zetia 10 mg daily.  2. Bisoprolol 10 mg daily.  3. Synthroid 0.075 mg daily.  4. AcipHex 20 mg daily.  5. Atacand 32 mg daily.  6. Tricor 145 nightly.  7. Advair 250/50 b.i.d.  8. Norvasc 10 mg daily.  9. Pravastatin 40 mg daily.  10.Benadryl Allergy daily.   PHYSICAL EXAM:  This is a pleasant 77 year old white female in no acute  distress.  Blood pressure 142/78, pulse 58, weight 144.  NECK:  Without JVD, HJR, or thyroid enlargement.  She has a left carotid  bruit.  LUNGS:  Decreased breath sounds with some expiratory wheezing in the lower  lung fields.  HEART:  Regular rate and rhythm at 58 beats per minute.  Normal S1 and S2.  No significant murmur  heard.  ABDOMEN:  Soft without organomegaly, masses, lesions, or abnormal  tenderness.  EXTREMITIES:  Without cyanosis, clubbing, or edema.  She has good distal  pulses.   IMPRESSION:  1. Atypical chest pain, suspect due to ongoing cough and chronic      obstructive pulmonary disease.  2. Dyspnea on exertion with history of congestive heart failure due to      nonischemic cardiomyopathy.  Will repeat 2D echo as she is overdue for      her yearly  echo.  3. Non-obstructive coronary artery disease in 2005.  4. Hypertension.  5. Hyperlipidemia.  6. History of intolerance to angiotensin-converting enzyme inhibitors      secondary to cost.  7. Chronic renal insufficiency.  Baseline creatinine 1.8.  8. Hypothyroidism.  9. Gastroesophageal reflux disease.   PLAN:  At this time, I suspect most of Ms. Benedicto's symptoms are coming  from her COPD, and have asked her to follow  up with Dr. Marcos Eke concerning  this.  I have ordered a 2D echo to reevaluate her LV function and make sure  this has not changed.  She is questioning why she is on 3 antihyperlipidemia  agents, but I have asked her to stay on these and discuss it with Dr.  Gala Romney when he sees her again, as he has prescribed these for her.  She  will see him back in 1 month to discuss the echo and other questions she  has.   EKG:  Ventricular paced.  Right bundle branch block.  Poor R wave  progression.  No acute change.      Jacolyn Reedy, PA-C  Electronically Signed      Cecil Cranker, MD, [facility]  Electronically Signed   ML/MedQ  DD: 06/24/2006  DT: 06/24/2006  Job #: [id number]

## 2011-01-04 NOTE — Op Note (Signed)
[name], [name]          ACCOUNT NO.:  [id number]   MEDICAL RECORD NO.:  [id number]          PATIENT TYPE:  AMB   LOCATION:  [facility]                          FACILITY:  [facility]   PHYSICIAN:  Gita Kudo, M.D. DATE OF BIRTH:  03/28/1930   DATE OF PROCEDURE:  08/15/2004  DATE OF DISCHARGE:                                 OPERATIVE REPORT   OPERATIVE PROCEDURE:  Biopsy right temporal artery.   SURGEON:  Gita Kudo, M.D.   ANESTHESIA:  1% Xylocaine.   PREOPERATIVE DIAGNOSIS:  Possible temporal arteritis.   POSTOPERATIVE DIAGNOSIS:  Pending pathology.   CLINICAL SUMMARY:  A 77 year old female with right-sided headache, evaluated  by Drs. Oneta Rack and Neva Seat and a right temporal artery biopsy was suggested  and the patient agreed.   OPERATIVE FINDINGS:  The right temporal artery was easily identified and a  segment removed between the ties and there was no apparent complication.   OPERATIVE PROCEDURE:  The patient was positioned, prepped and draped in a  standard fashion.  A total of 20 mL of 1% Xylocaine was infiltrated during  the procedure for analgesia and postoperative pain relief.  A curved  incision made in front of the right ear and carried down to the superficial  fascia.  This was then spread bluntly and the artery identified.  It was  isolated with ties of 3-0 chromic and a small segment excised and sent to  pathology.   The wound was then closed with 3-0 chromic and 5-0 nylon and a dressing  applied.  Written instructions given.  There were no complications.       MRL/MEDQ  D:  08/15/2004  T:  08/15/2004  Job:  [id number]   cc:   Lucky Cowboy, M.D.  [address], [address]  [place], [place] [address]  Fax: [phone]   Estelle June. Neva Seat, M.D.  [address] [place]  [place] [address]  Fax: [phone]

## 2011-02-13 ENCOUNTER — Ambulatory Visit (INDEPENDENT_AMBULATORY_CARE_PROVIDER_SITE_OTHER): Payer: Medicare Other | Admitting: Emergency Medicine

## 2011-02-13 ENCOUNTER — Encounter: Payer: Self-pay | Admitting: Emergency Medicine

## 2011-02-13 DIAGNOSIS — J449 Chronic obstructive pulmonary disease, unspecified: Secondary | ICD-10-CM

## 2011-02-13 DIAGNOSIS — R05 Cough: Secondary | ICD-10-CM

## 2011-02-13 NOTE — Patient Instructions (Signed)
Stop Symbicort for now Continue your Spiriva daily Walking oximetry today We will repeat your breathing tests at your next office visit Continue your benadryl and omeprazole as you are taking them Try doing nasal saline washes once a day Stop fluticasone nasal spray and try using nasonex 2 sprays each nostril daily to see if this is better tolerated Follow up with Dr Delton Coombes in 1 month with full PFTs

## 2011-02-13 NOTE — Assessment & Plan Note (Signed)
Worsening dyspnea - not clear whether this is progression of her COPD vs her cardiac disease. ? If she is hypoxic - walking oximetry today - repeat PFT - stop Symbicort, she hasn';t felt a benefit, may be coughing worse on this - rov 1 month

## 2011-02-13 NOTE — Progress Notes (Signed)
  Subjective:    Patient ID: [name], female    DOB: August 23, 1929, 77 y.o.   MRN: [id number]  HPI [name] is a 77 year old woman with COPD that is in the mild to moderate range. She is followed by Dr Gala Romney for congestive heart failure secondary to nonischemic cardiomyopathy. Previous EF was 25-30%. She is status post BiV ICD, EF now 60%. She also has diastolic dysfunction and a history of pulmonary edema. Has OSA but unable to tolerate CPAP. Pacer changed   ROV 03/08/10 -- returns for her COPD, systolic/diastolic CHF, OSA. Since last visit she had reversal of her colostomy, still doesn't have her strength back. She still has cough. Has been using flutter valve, has been trying to practice swallowing precautions. She coughs whenever she takes her Advair. Seems to tolerate the Spiriva. She has had some increased LE edema, currently seems to be at baseline.   ROV 05/14/10 --  moderate COPD, NICM s/p BiV pacer, OSA (not on CPAP). Last time we discussed her cough, stopped Advair and continued Spiriva. Also started fluticasone nasal spray at bedtime. Her cough is better. Hasn't really missed the Advair, breathing is stable.   ROV 10/05/10 -- regular f/u for COPD, also hx systolic and diastolic CHF s/p BiV pacer, OSA not on CPAP. Just had BiV pacer changed on 09/03/10. She returns telling me that her SOB has slowly progressed over last 6 - 12 months. Not able to walk as far, not able to cook or clean. She has been coughing daily, not always productive. Hears wheeze at night and sometimes during the day, better with coughing up phlegm. Currently on Spiriva, stopped Advair. Not taking fluticasone spray. Hasn't tolerated mucinex before.   ROV 11/05/10 -- COPD, systolic/diastolic CHF, biV pacer, OSA not on CPAP. Last time we added Symbicort to Spiriva. Her breathing has benefitted, SABA use has decreased to 2x a day. ? having some palpitations. Still limited with exertion. Wakes up at night and uses the  SABA, helps her. Still coughing phlegm every day. Fluticasone spray may have helped some.   ROV 02/13/11 -- COPD (moderate), systolic and diastolic CHF + BiV pacer, OSA not on CPAP. She is currently on Spiriva + Symbicort. Using SABA about 2x a day. Only using fluticasone nasal occasionally because it was putting sores in her nose - it did help her drainage and cough.  Taking benadryl every night. She is on omeprazole bid.    Review of Systems As per HPI    Objective:   Physical Exam Gen: Pleasant, well-nourished, in no distress,  normal affect  ENT: No lesions,  mouth clear,  oropharynx clear, no postnasal drip  Neck: No JVD, no TMG, no carotid bruits  Lungs: No use of accessory muscles, no dullness to percussion, clear without rales or rhonchi  Cardiovascular: RRR, heart sounds normal, no murmur or gallops, no peripheral edema  Musculoskeletal: No deformities, no cyanosis or clubbing  Neuro: alert, non focal  Skin: Warm, no lesions or rashes     Assessment & Plan:  COPD Worsening dyspnea - not clear whether this is progression of her COPD vs her cardiac disease. ? If she is hypoxic - walking oximetry today - repeat PFT - stop Symbicort, she hasn';t felt a benefit, may be coughing worse on this - rov 1 month  COUGH Try substituting nasonex for fluticasone Try nsw Contin omeprazole and benadryl.

## 2011-02-13 NOTE — Assessment & Plan Note (Signed)
Try substituting nasonex for fluticasone Try nsw Contin omeprazole and benadryl.

## 2011-02-28 ENCOUNTER — Ambulatory Visit (INDEPENDENT_AMBULATORY_CARE_PROVIDER_SITE_OTHER): Payer: Medicare Other | Admitting: *Deleted

## 2011-02-28 DIAGNOSIS — I428 Other cardiomyopathies: Secondary | ICD-10-CM

## 2011-02-28 DIAGNOSIS — I429 Cardiomyopathy, unspecified: Secondary | ICD-10-CM

## 2011-03-01 ENCOUNTER — Other Ambulatory Visit: Payer: Self-pay | Admitting: Internal Medicine

## 2011-03-05 LAB — REMOTE PACEMAKER DEVICE
AL AMPLITUDE: 3.6 mv
AL IMPEDENCE PM: 437 Ohm
ATRIAL PACING PM: 0.04
BAMS-0001: 170 {beats}/min
BATTERY VOLTAGE: 2.9974 V
LV LEAD THRESHOLD: 0.625 V
RV LEAD AMPLITUDE: 5.5 mv
RV LEAD IMPEDENCE PM: 589 Ohm
RV LEAD IMPEDENCE PM: 589 Ohm
RV LEAD THRESHOLD: 2 V
VENTRICULAR PACING PM: 99.98
VENTRICULAR PACING PM: 99.98

## 2011-03-13 ENCOUNTER — Encounter: Payer: Self-pay | Admitting: *Deleted

## 2011-03-13 NOTE — Progress Notes (Signed)
Pacer remote check  

## 2011-03-19 ENCOUNTER — Ambulatory Visit (INDEPENDENT_AMBULATORY_CARE_PROVIDER_SITE_OTHER): Payer: Medicare Other | Admitting: Emergency Medicine

## 2011-03-19 ENCOUNTER — Encounter: Payer: Self-pay | Admitting: Emergency Medicine

## 2011-03-19 VITALS — BP 122/80 | HR 93 | Temp 98.2°F | Ht 64.0 in | Wt 131.0 lb

## 2011-03-19 DIAGNOSIS — J449 Chronic obstructive pulmonary disease, unspecified: Secondary | ICD-10-CM

## 2011-03-19 LAB — PULMONARY FUNCTION TEST

## 2011-03-19 MED ORDER — LORATADINE 10 MG PO TABS: 10.0000 mg | ORAL_TABLET | Freq: Every day | ORAL | Status: DC

## 2011-03-19 NOTE — Progress Notes (Signed)
Subjective:    Patient ID: [name], female    DOB: October 18, 1929, 77 y.o.   MRN: [id number]  HPI [name] is a 77 year old woman with COPD that is in the mild to moderate range. She is followed by Dr Gala Romney for congestive heart failure secondary to nonischemic cardiomyopathy. Previous EF was 25-30%. She is status post BiV ICD, EF now 60%. She also has diastolic dysfunction and a history of pulmonary edema. Has OSA but unable to tolerate CPAP. Pacer changed   ROV 03/08/10 -- returns for her COPD, systolic/diastolic CHF, OSA. Since last visit she had reversal of her colostomy, still doesn't have her strength back. She still has cough. Has been using flutter valve, has been trying to practice swallowing precautions. She coughs whenever she takes her Advair. Seems to tolerate the Spiriva. She has had some increased LE edema, currently seems to be at baseline.   ROV 05/14/10 --  moderate COPD, NICM s/p BiV pacer, OSA (not on CPAP). Last time we discussed her cough, stopped Advair and continued Spiriva. Also started fluticasone nasal spray at bedtime. Her cough is better. Hasn't really missed the Advair, breathing is stable.   ROV 10/05/10 -- regular f/u for COPD, also hx systolic and diastolic CHF s/p BiV pacer, OSA not on CPAP. Just had BiV pacer changed on 09/03/10. She returns telling me that her SOB has slowly progressed over last 6 - 12 months. Not able to walk as far, not able to cook or clean. She has been coughing daily, not always productive. Hears wheeze at night and sometimes during the day, better with coughing up phlegm. Currently on Spiriva, stopped Advair. Not taking fluticasone spray. Hasn't tolerated mucinex before.   ROV 11/05/10 -- COPD, systolic/diastolic CHF, biV pacer, OSA not on CPAP. Last time we added Symbicort to Spiriva. Her breathing has benefitted, SABA use has decreased to 2x a day. ? having some palpitations. Still limited with exertion. Wakes up at night and uses the  SABA, helps her. Still coughing phlegm every day. Fluticasone spray may have helped some.   ROV 02/13/11 -- COPD (moderate), systolic and diastolic CHF + BiV pacer, OSA not on CPAP. She is currently on Spiriva + Symbicort. Using SABA about 2x a day. Only using fluticasone nasal occasionally because it was putting sores in her nose - it did help her drainage and cough.  Taking benadryl every night. She is on omeprazole bid.   ROV 03/19/11 -- hx of COPD,  systolic and diastolic CHF + BiV pacer, OSA not on CPAP. Last time we stopped symbicort and continued Spiriva, repeated PFT's. Since last visit she has continued to have dyspnea. On Sunday she had an episode that was associated with CP. She was started on daliresp mid-July by Dr Oneta Rack. She believes that it may have helped her, although her cough is worse. We changed the fluticasone nose spray to nasonex, but she couldn't tolerate due to sores in her nose. She did the [place] x 1, but couldn't continue it. Her PFT today show moderate AFL, little change FEV1 from priors.    Review of Systems As per HPI    Objective:   Physical Exam Gen: Pleasant, well-nourished, in no distress,  normal affect  ENT: No lesions,  mouth clear,  oropharynx clear, no postnasal drip  Neck: No JVD, no TMG, no carotid bruits  Lungs: No use of accessory muscles, no dullness to percussion, clear without rales or rhonchi  Cardiovascular: RRR, heart sounds normal, no murmur  or gallops, no peripheral edema  Musculoskeletal: No deformities, no cyanosis or clubbing  Neuro: alert, non focal  Skin: Warm, no lesions or rashes     Assessment & Plan:  COPD Worsening dyspnea - not clear whether this is progression of her COPD vs her cardiac disease. ? If she is hypoxic - walking oximetry today - repeat PFT - stop Symbicort, she hasn';t felt a benefit, may be coughing worse on this - rov 1 month  COUGH Try substituting nasonex for fluticasone Try nsw Contin omeprazole  and benadryl.

## 2011-03-19 NOTE — Patient Instructions (Addendum)
Please continue your Spiriva daily Continue your benadryl every night Start loratadine 10mg  every morning.  Use your ventolin as needed Follow up with Dr Delton Coombes in 3 months or sooner if needed

## 2011-03-19 NOTE — Assessment & Plan Note (Addendum)
Will not add any other BD at this time - continue Spiriva + SABA She probably is having UA irritation as a large part of this, hasn't been able to tolerate nasal steroid.  Trial loratadine 10mg  qd  ? Whether she needs any further cardiac workup.  Follow up in 3 months or prn

## 2011-03-19 NOTE — Progress Notes (Signed)
PFT done today. 

## 2011-03-27 ENCOUNTER — Other Ambulatory Visit: Payer: Self-pay | Admitting: Physician Assistant

## 2011-03-27 DIAGNOSIS — T148XXA Other injury of unspecified body region, initial encounter: Secondary | ICD-10-CM

## 2011-03-28 ENCOUNTER — Ambulatory Visit
Admission: RE | Admit: 2011-03-28 | Discharge: 2011-03-28 | Disposition: A | Discharge status: Home / Self Care | Payer: Medicare Other | Source: Ambulatory Visit | Attending: Physician Assistant | Admitting: Physician Assistant

## 2011-03-28 ENCOUNTER — Other Ambulatory Visit: Payer: Self-pay | Admitting: Physician Assistant

## 2011-03-28 DIAGNOSIS — T148XXA Other injury of unspecified body region, initial encounter: Secondary | ICD-10-CM

## 2011-03-29 ENCOUNTER — Other Ambulatory Visit: Payer: Self-pay | Admitting: Physician Assistant

## 2011-03-29 ENCOUNTER — Other Ambulatory Visit: Payer: Medicare Other

## 2011-03-29 ENCOUNTER — Ambulatory Visit
Admission: RE | Admit: 2011-03-29 | Discharge: 2011-03-29 | Disposition: A | Discharge status: Home / Self Care | Payer: Medicare Other | Source: Ambulatory Visit | Attending: Physician Assistant | Admitting: Physician Assistant

## 2011-03-29 DIAGNOSIS — T148XXA Other injury of unspecified body region, initial encounter: Secondary | ICD-10-CM

## 2011-04-22 ENCOUNTER — Other Ambulatory Visit: Payer: Self-pay | Admitting: Emergency Medicine

## 2011-05-06 ENCOUNTER — Other Ambulatory Visit (HOSPITAL_COMMUNITY): Payer: Self-pay | Admitting: Internal Medicine

## 2011-05-06 DIAGNOSIS — Z1231 Encounter for screening mammogram for malignant neoplasm of breast: Secondary | ICD-10-CM

## 2011-05-14 ENCOUNTER — Ambulatory Visit (HOSPITAL_COMMUNITY)
Admission: RE | Admit: 2011-05-14 | Discharge: 2011-05-14 | Disposition: A | Discharge status: Home / Self Care | Payer: Medicare Other | Source: Ambulatory Visit | Attending: Internal Medicine | Admitting: Internal Medicine

## 2011-05-14 ENCOUNTER — Other Ambulatory Visit: Payer: Self-pay | Admitting: Internal Medicine

## 2011-05-14 DIAGNOSIS — Z1231 Encounter for screening mammogram for malignant neoplasm of breast: Secondary | ICD-10-CM

## 2011-05-14 DIAGNOSIS — N63 Unspecified lump in unspecified breast: Secondary | ICD-10-CM

## 2011-05-20 LAB — BASIC METABOLIC PANEL
GFR calc Af Amer: 52 — ABNORMAL LOW
GFR calc non Af Amer: 43 — ABNORMAL LOW
Potassium: 4.8
Sodium: 137

## 2011-05-22 LAB — URINALYSIS, ROUTINE W REFLEX MICROSCOPIC
Nitrite: NEGATIVE
Protein, ur: NEGATIVE
Specific Gravity, Urine: 1.006
Urobilinogen, UA: 0.2

## 2011-05-22 LAB — BASIC METABOLIC PANEL
BUN: 8
CO2: 25
Calcium: 8.6
Calcium: 9
Calcium: 9.3
Chloride: 109
GFR calc Af Amer: >60
GFR calc Af Amer: >60
GFR calc non Af Amer: 47 — ABNORMAL LOW
GFR calc non Af Amer: 48 — ABNORMAL LOW
GFR calc non Af Amer: 53 — ABNORMAL LOW
Glucose, Bld: 141 — ABNORMAL HIGH
Glucose, Bld: 93
Potassium: 3.5
Potassium: 3.8
Potassium: 3.8
Potassium: 4.4
Sodium: 134 — ABNORMAL LOW
Sodium: 137
Sodium: 139
Sodium: 140

## 2011-05-22 LAB — URINE CULTURE

## 2011-05-22 LAB — CBC
HCT: 31.2 — ABNORMAL LOW
HCT: 31.8 — ABNORMAL LOW
HCT: 32.2 — ABNORMAL LOW
HCT: 33.8 — ABNORMAL LOW
HCT: 39.2
Hemoglobin: 10.5 — ABNORMAL LOW
Hemoglobin: 10.9 — ABNORMAL LOW
Hemoglobin: 11.4 — ABNORMAL LOW
Hemoglobin: 11.5 — ABNORMAL LOW
MCHC: 33
MCHC: 33.6
MCV: 89.8
MCV: 89.9
MCV: 90.3
Platelets: 328
Platelets: 402 — ABNORMAL HIGH
RBC: 3.53 — ABNORMAL LOW
RBC: 3.63 — ABNORMAL LOW
RBC: 3.77 — ABNORMAL LOW
RBC: 4.34
RDW: 13.4
RDW: 13.5
RDW: 13.7
WBC: 10.1
WBC: 7
WBC: 7.6
WBC: 9.4
WBC: 9.4

## 2011-05-22 LAB — LIPID PANEL
Cholesterol: 157
HDL: 38 — ABNORMAL LOW
LDL Cholesterol: 104 — ABNORMAL HIGH
Triglycerides: 75

## 2011-05-22 LAB — GLUCOSE, CAPILLARY: Glucose-Capillary: 82

## 2011-05-22 LAB — IRON AND TIBC
Iron: 83
Saturation Ratios: 31
TIBC: 270

## 2011-05-22 LAB — TROPONIN I: Troponin I: <0.01

## 2011-05-22 LAB — COMPREHENSIVE METABOLIC PANEL
AST: 16
AST: 42 — ABNORMAL HIGH
Alkaline Phosphatase: 71
BUN: 11
BUN: 5 — ABNORMAL LOW
CO2: 27
CO2: 35 — ABNORMAL HIGH
Calcium: 9.4
Chloride: 102
Chloride: 96
Creatinine, Ser: 0.9
Creatinine, Ser: 1.07
GFR calc Af Amer: >60
GFR calc Af Amer: >60
GFR calc non Af Amer: 50 — ABNORMAL LOW
GFR calc non Af Amer: >60
Glucose, Bld: 133 — ABNORMAL HIGH
Potassium: 3 — ABNORMAL LOW
Total Bilirubin: 0.5
Total Bilirubin: 0.6

## 2011-05-22 LAB — CLOSTRIDIUM DIFFICILE EIA
C difficile Toxins A+B, EIA: NEGATIVE
C difficile Toxins A+B, EIA: NEGATIVE

## 2011-05-22 LAB — FECAL LACTOFERRIN, QUANT: Fecal Lactoferrin: NEGATIVE

## 2011-05-22 LAB — FOLATE: Folate: 12.4

## 2011-05-22 LAB — CULTURE, BLOOD (ROUTINE X 2): Culture: NO GROWTH

## 2011-05-22 LAB — TSH: TSH: 0.772

## 2011-05-22 LAB — SEDIMENTATION RATE: Sed Rate: 51 — ABNORMAL HIGH

## 2011-05-22 LAB — GIARDIA/CRYPTOSPORIDIUM SCREEN(EIA): Cryptosporidium Screen (EIA): NEGATIVE

## 2011-05-22 LAB — POCT I-STAT, CHEM 8
BUN: 4 — ABNORMAL LOW
Chloride: 103
Sodium: 140

## 2011-05-22 LAB — FERRITIN: Ferritin: 93 (ref 10–291)

## 2011-05-22 LAB — APTT: aPTT: 45 — ABNORMAL HIGH

## 2011-05-22 LAB — POCT CARDIAC MARKERS
CKMB, poc: 5.9
Troponin i, poc: <0.05

## 2011-05-22 LAB — OCCULT BLOOD X 1 CARD TO LAB, STOOL: Fecal Occult Bld: NEGATIVE

## 2011-05-22 LAB — STOOL CULTURE

## 2011-05-27 ENCOUNTER — Ambulatory Visit
Admission: RE | Admit: 2011-05-27 | Discharge: 2011-05-27 | Disposition: A | Discharge status: Home / Self Care | Payer: Medicare Other | Source: Ambulatory Visit | Attending: Internal Medicine | Admitting: Internal Medicine

## 2011-05-27 ENCOUNTER — Other Ambulatory Visit: Payer: Self-pay | Admitting: Internal Medicine

## 2011-05-27 DIAGNOSIS — N63 Unspecified lump in unspecified breast: Secondary | ICD-10-CM

## 2011-06-06 ENCOUNTER — Encounter: Payer: Self-pay | Admitting: Internal Medicine

## 2011-06-06 ENCOUNTER — Ambulatory Visit (INDEPENDENT_AMBULATORY_CARE_PROVIDER_SITE_OTHER): Payer: Medicare Other | Admitting: *Deleted

## 2011-06-06 ENCOUNTER — Other Ambulatory Visit: Payer: Self-pay | Admitting: Internal Medicine

## 2011-06-06 DIAGNOSIS — I429 Cardiomyopathy, unspecified: Secondary | ICD-10-CM

## 2011-06-06 DIAGNOSIS — I509 Heart failure, unspecified: Secondary | ICD-10-CM

## 2011-06-07 LAB — REMOTE PACEMAKER DEVICE
AL IMPEDENCE PM: 475 Ohm
AL THRESHOLD: 0.5 V
BAMS-0001: 170 {beats}/min
BATTERY VOLTAGE: 2.9979 V
LV LEAD THRESHOLD: 0.75 V
RV LEAD AMPLITUDE: 8.1 mv
RV LEAD IMPEDENCE PM: 646 Ohm
RV LEAD THRESHOLD: 2.375 V

## 2011-06-18 ENCOUNTER — Encounter: Payer: Self-pay | Admitting: *Deleted

## 2011-06-18 NOTE — Progress Notes (Signed)
Pacer remote check w/icm

## 2011-06-27 ENCOUNTER — Other Ambulatory Visit: Payer: Self-pay | Admitting: Orthopaedic Surgery

## 2011-06-27 DIAGNOSIS — M545 Low back pain: Secondary | ICD-10-CM

## 2011-07-04 ENCOUNTER — Ambulatory Visit
Admission: RE | Admit: 2011-07-04 | Discharge: 2011-07-04 | Disposition: A | Discharge status: Home / Self Care | Payer: Medicare Other | Source: Ambulatory Visit | Attending: Orthopaedic Surgery | Admitting: Orthopaedic Surgery

## 2011-07-04 DIAGNOSIS — M545 Low back pain, unspecified: Secondary | ICD-10-CM

## 2011-07-11 IMAGING — CR DG CHEST 2V
2 series · 2 of 2 positions shown · non-contrast
Comparison: chest x-ray 01/15/2010 and 12/11/2009

CLINICAL DATA: Cough

CHEST - 2 VIEW

[view not recorded (1 of 2)]
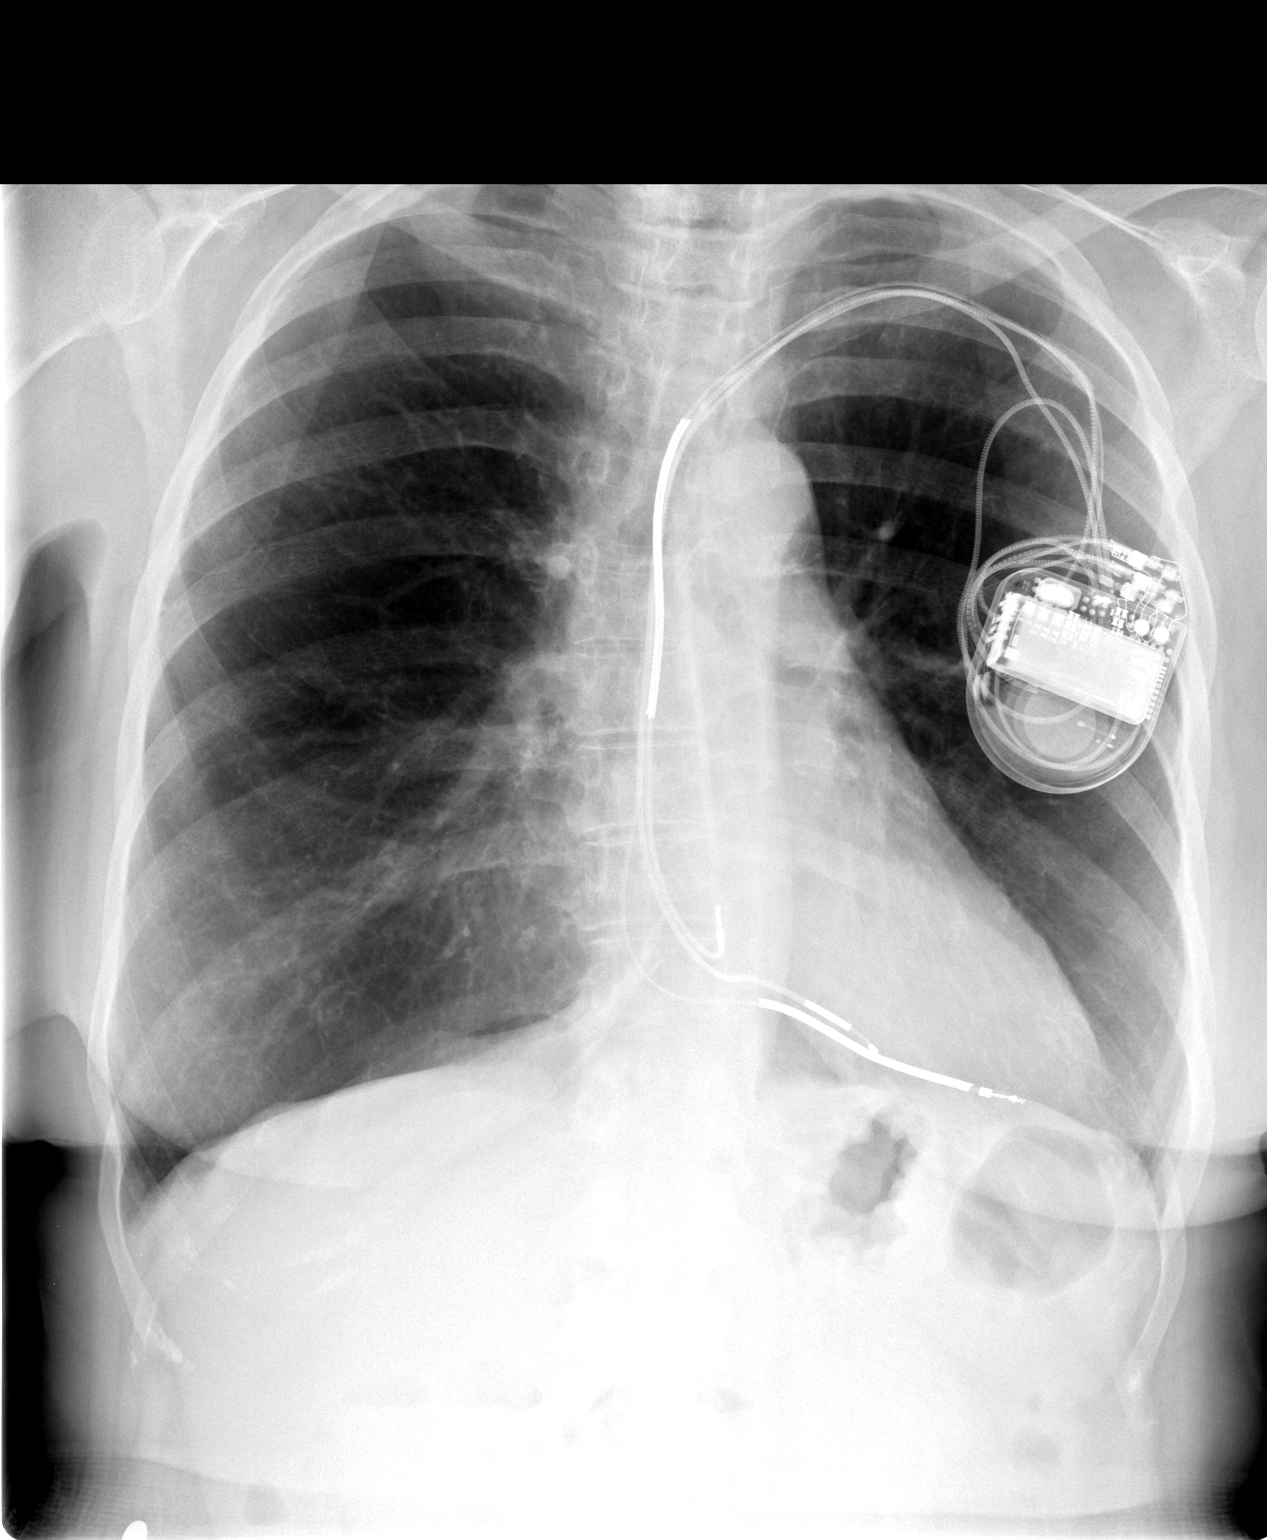

[view not recorded (2 of 2)]
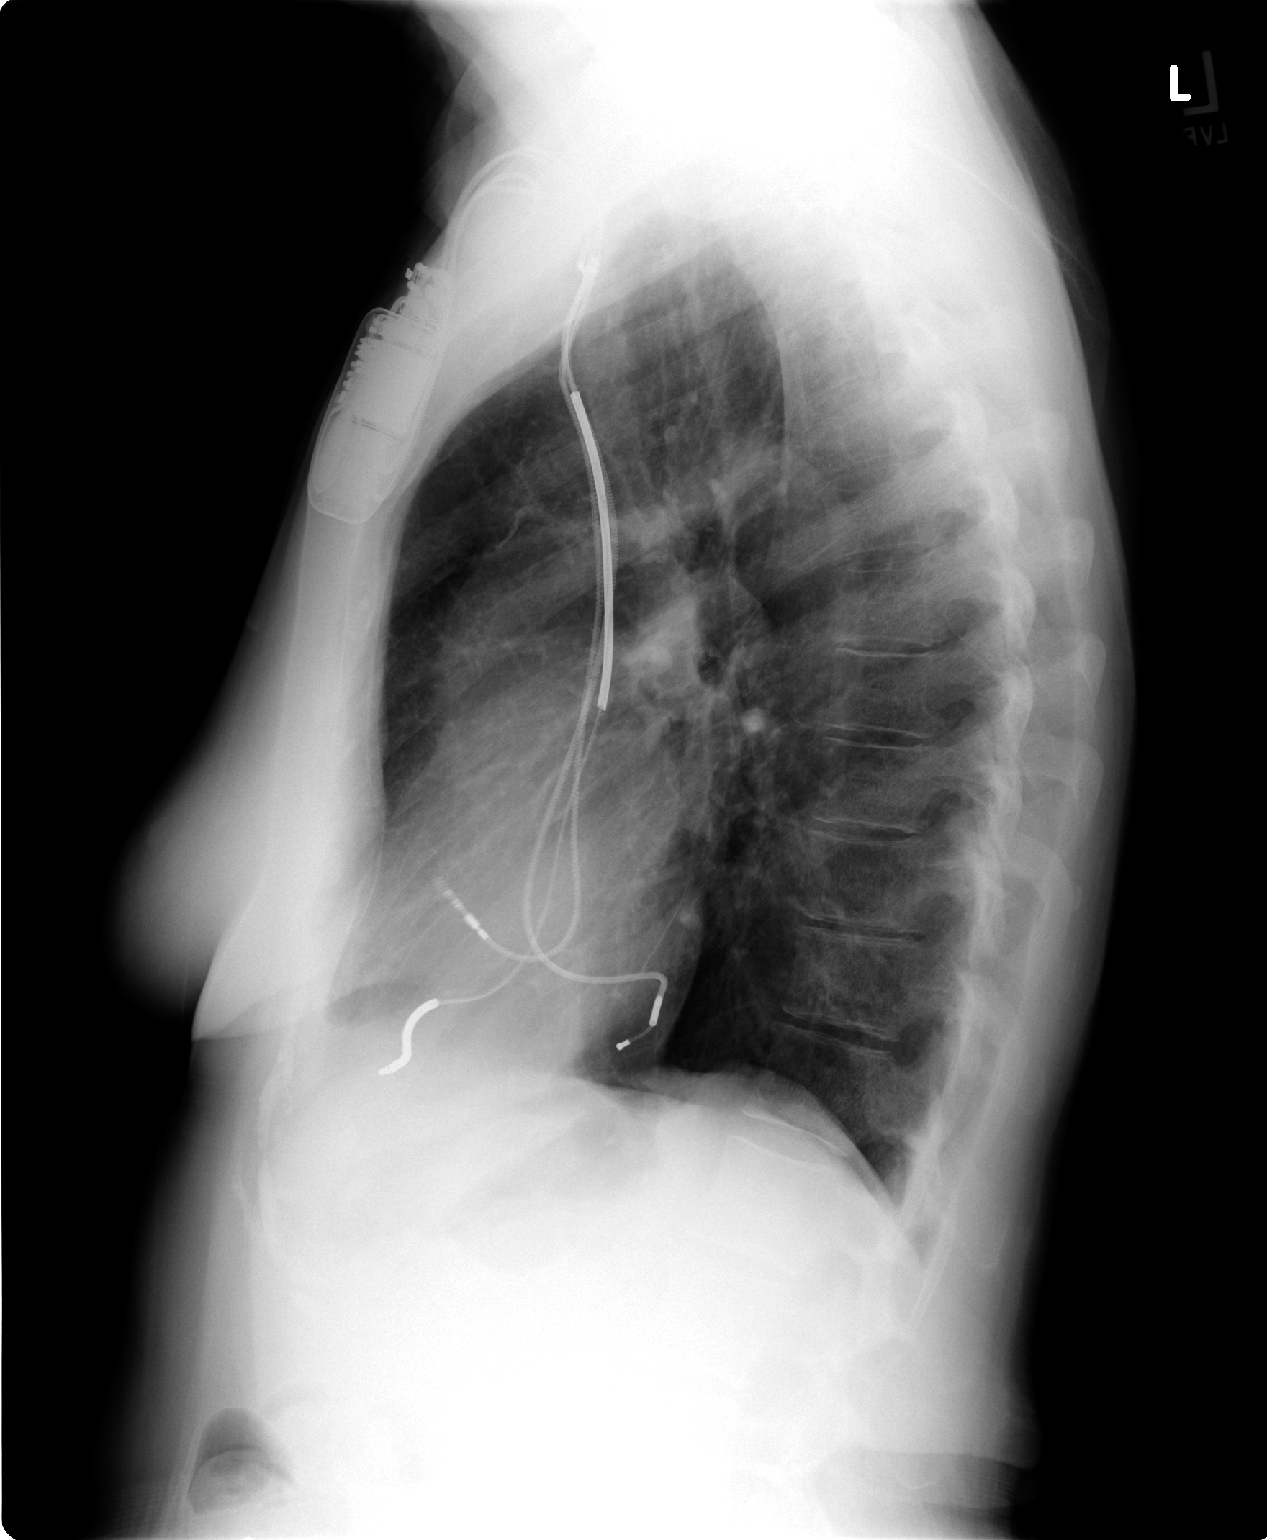

[2 of 2 positions shown; findings below may reference images not displayed]

FINDINGS: Left subclavian AICD/pacer with leads in the right
atrium, right ventricle and coronary sinus is stable.  The heart
size is within normal limits and stable.  Pulmonary vascularity is
normal.  The lungs are clear.  There is no pleural effusion.  No
acute bony abnormality.
IMPRESSION: No acute cardiopulmonary disease.

## 2011-09-17 ENCOUNTER — Encounter: Payer: Self-pay | Admitting: Internal Medicine

## 2011-09-17 ENCOUNTER — Ambulatory Visit (INDEPENDENT_AMBULATORY_CARE_PROVIDER_SITE_OTHER): Payer: Medicare Other | Admitting: Internal Medicine

## 2011-09-17 DIAGNOSIS — I509 Heart failure, unspecified: Secondary | ICD-10-CM

## 2011-09-17 DIAGNOSIS — I4891 Unspecified atrial fibrillation: Secondary | ICD-10-CM | POA: Insufficient documentation

## 2011-09-17 DIAGNOSIS — J449 Chronic obstructive pulmonary disease, unspecified: Secondary | ICD-10-CM

## 2011-09-17 DIAGNOSIS — I429 Cardiomyopathy, unspecified: Secondary | ICD-10-CM

## 2011-09-17 DIAGNOSIS — Z950 Presence of cardiac pacemaker: Secondary | ICD-10-CM | POA: Insufficient documentation

## 2011-09-17 LAB — PACEMAKER DEVICE OBSERVATION
ATRIAL PACING PM: 0.04
BAMS-0001: 170 {beats}/min
BATTERY VOLTAGE: 2.9972 V
LV LEAD THRESHOLD: 0.75 V
RV LEAD THRESHOLD: 1.75 V
VENTRICULAR PACING PM: 99.97

## 2011-09-17 MED ORDER — LEVALBUTEROL TARTRATE 45 MCG/ACT IN AERO: 1.0000 | INHALATION_SPRAY | Freq: Four times a day (QID) | RESPIRATORY_TRACT | Status: DC | PRN

## 2011-09-17 NOTE — Patient Instructions (Signed)
Your physician has recommended you make the following change in your medication:  1) Stop albuterol. 2) Start xopenex 1-2 puffs every 6 hours as needed.  Remote monitoring is used to monitor your Pacemaker of ICD from home. This monitoring reduces the number of office visits required to check your device to one time per year. It allows [place] to keep an eye on the functioning of your device to ensure it is working properly. You are scheduled for a device check from home on 12/19/11. You may send your transmission at any time that day. If you have a wireless device, the transmission will be sent automatically. After your physician reviews your transmission, you will receive a postcard with your next transmission date.   Your physician wants you to follow-up in: 1 year with Dr. Graciela Husbands. You will receive a reminder letter in the mail two months in advance. If you don't receive a letter, please call our office to schedule the follow-up appointment.

## 2011-09-17 NOTE — Progress Notes (Signed)
HPI  [name] is a 78 y.o. female is seen in followup for congestive heart failure in the setting of   nonischemic cardiomyopathy, previous EF was 25-30%. She is status post BIV/ICD with   generator replacement CRT-P in spring 2012  Most recent ejection fraction was 60%by echo spring 2011  She denies significant chest pain. He has had some palpitations periodically that may be related to the use of her inhalers.     Past Medical History  Diagnosis Date  . CONGESTIVE HEART FAILURE 06/03/2007  . COPD 06/03/2007  . HYPERTENSION 06/03/2007  . HYPOTHYROIDISM 06/03/2007    Past Surgical History  Procedure Date  . Cardiac defibrillator placement   . Appendectomy   . Hysterectomy - unknown type   . Posterior fusion clivus-c2 transarticular stealth guided w/ icbg   . Cystectomy     elbow  . Icd     medtronic maximo  . Polypectomy     bladder    Current Outpatient Prescriptions  Medication Sig Dispense Refill  . albuterol (VENTOLIN HFA) 108 (90 BASE) MCG/ACT inhaler Inhale 2 puffs into the lungs every 6 (six) hours as needed.        [name] DALIRESP 500 MCG TABS tablet Once daily      . diltiazem (CARDIZEM CD) 240 MG 24 hr capsule Take 240 mg by mouth daily.       [name] DIOVAN HCT 160-12.5 MG per tablet take 1 tablet once daily  30 tablet  6  . diphenhydrAMINE (BENADRYL) 25 MG tablet Take 50 mg by mouth at bedtime as needed.        . fenofibrate micronized (LOFIBRA) 134 MG capsule Take 134 mg by mouth every other day.        [name] FREESTYLE LITE test strip       . glyBURIDE (DIABETA) 5 MG tablet Take 5 mg by mouth. One every other Day      . levothyroxine (SYNTHROID, LEVOTHROID) 75 MCG tablet Take 75 mcg by mouth daily.        . ondansetron (ZOFRAN) 8 MG tablet As needed for nausea       . polyethylene glycol (MIRALAX / GLYCOLAX) packet Take 17 g by mouth daily.        [name] PRILOSEC OTC 20 MG tablet 1 by mouth twice daily      . SPIRIVA HANDIHALER 18 MCG inhalation capsule INHALE THE  CONTENTS OF 1 CAPSULE BY MOUTH VIA HANDIHALER ONCE DAILY.  30 capsule  5    Allergies  Allergen Reactions  . Ace Inhibitors   . Atorvastatin   . Bisoprolol Fumarate   . Ezetimibe-Simvastatin   . Fluvastatin Sodium   . Gemfibrozil   . Lisinopril     REACTION: unspecified  . Oxycodone Hcl     REACTION: vomiting  . Rosuvastatin   . Tylenol-Codeine     REACTION: n/v    Review of Systems negative except from HPI and PMH  Physical Exam BP 137/79  Pulse 87  Ht 5\' 4"  (1.626 m)  Wt 126 lb (57.153 kg)  BMI 21.63 kg/m2 Well developed and well nourished in no acute distress HENT normal E scleral and icterus clear Neck Supple JVP flat; carotids brisk and full Device pocket well-healed but significant  thinning of the skin above the device. There is however good movement across any protrusion Clear to ausculation egular rate and rhythm, no murmurs gallops or rub Soft with active bowel sounds No clubbing cyanosis none Edema Alert  and oriented, grossly normal motor and sensory function Skin Warm and Dry   Assessment and  Plan

## 2011-09-17 NOTE — Assessment & Plan Note (Signed)
Brief paroxysms of atrial fibrillation are suggested by her monitor. They seem to be worse in her mind with her inhalers. I have taken the liberty of switching albuterol to Xopenex hopefully this will help

## 2011-09-17 NOTE — Assessment & Plan Note (Signed)
The patient's device was interrogated.  The information was reviewed. No changes were made in the programming.    

## 2011-09-17 NOTE — Assessment & Plan Note (Signed)
Continue the current medications; no evidence of volume overload

## 2011-09-17 NOTE — Assessment & Plan Note (Signed)
Continue current medications. 

## 2011-10-22 ENCOUNTER — Other Ambulatory Visit: Payer: Self-pay | Admitting: Emergency Medicine

## 2011-11-23 ENCOUNTER — Other Ambulatory Visit: Payer: Self-pay | Admitting: Emergency Medicine

## 2011-12-19 ENCOUNTER — Telehealth: Payer: Self-pay | Admitting: Internal Medicine

## 2011-12-19 ENCOUNTER — Encounter: Payer: Self-pay | Admitting: Internal Medicine

## 2011-12-19 ENCOUNTER — Ambulatory Visit (INDEPENDENT_AMBULATORY_CARE_PROVIDER_SITE_OTHER): Payer: Medicare Other | Admitting: *Deleted

## 2011-12-19 DIAGNOSIS — I509 Heart failure, unspecified: Secondary | ICD-10-CM

## 2011-12-19 DIAGNOSIS — I429 Cardiomyopathy, unspecified: Secondary | ICD-10-CM

## 2011-12-19 DIAGNOSIS — I498 Other specified cardiac arrhythmias: Secondary | ICD-10-CM

## 2011-12-19 DIAGNOSIS — I5032 Chronic diastolic (congestive) heart failure: Secondary | ICD-10-CM

## 2011-12-19 NOTE — Telephone Encounter (Signed)
Pt having trouble with sending transmission and there is no 800 number on her box

## 2011-12-20 NOTE — Telephone Encounter (Signed)
Transmission was received. Pt is aware that transmission was received.

## 2011-12-23 ENCOUNTER — Other Ambulatory Visit: Payer: Self-pay | Admitting: Emergency Medicine

## 2011-12-23 ENCOUNTER — Encounter (INDEPENDENT_AMBULATORY_CARE_PROVIDER_SITE_OTHER): Payer: Self-pay | Admitting: General Surgery

## 2011-12-24 LAB — REMOTE PACEMAKER DEVICE
AL AMPLITUDE: 3.3 mv
AL IMPEDENCE PM: 380 Ohm
BAMS-0001: 170 {beats}/min
BATTERY VOLTAGE: 2.9891 V
RV LEAD AMPLITUDE: 10.8 mv
RV LEAD IMPEDENCE PM: 589 Ohm

## 2011-12-27 NOTE — Progress Notes (Signed)
PPM remote 

## 2012-01-01 ENCOUNTER — Other Ambulatory Visit: Payer: Self-pay | Admitting: Emergency Medicine

## 2012-01-01 NOTE — Telephone Encounter (Signed)
Pt was last seen by Dr. Delton Coombes on 03/19/11.  She does have a pending OV with him on 02/06/2012.  Will send Rx.

## 2012-01-08 ENCOUNTER — Encounter: Payer: Self-pay | Admitting: *Deleted

## 2012-01-23 ENCOUNTER — Ambulatory Visit (INDEPENDENT_AMBULATORY_CARE_PROVIDER_SITE_OTHER): Payer: Medicare Other | Admitting: *Deleted

## 2012-01-23 ENCOUNTER — Encounter: Payer: Self-pay | Admitting: Internal Medicine

## 2012-01-23 DIAGNOSIS — Z950 Presence of cardiac pacemaker: Secondary | ICD-10-CM

## 2012-01-23 DIAGNOSIS — I5032 Chronic diastolic (congestive) heart failure: Secondary | ICD-10-CM

## 2012-01-23 DIAGNOSIS — I4891 Unspecified atrial fibrillation: Secondary | ICD-10-CM

## 2012-01-28 LAB — REMOTE PACEMAKER DEVICE
AL THRESHOLD: 0.5 V
ATRIAL PACING PM: 0.04
RV LEAD IMPEDENCE PM: 665 Ohm
RV LEAD THRESHOLD: 2.5 V

## 2012-02-05 ENCOUNTER — Encounter: Payer: Self-pay | Admitting: *Deleted

## 2012-02-06 ENCOUNTER — Encounter: Payer: Self-pay | Admitting: Emergency Medicine

## 2012-02-06 ENCOUNTER — Encounter (INDEPENDENT_AMBULATORY_CARE_PROVIDER_SITE_OTHER): Payer: Self-pay | Admitting: General Surgery

## 2012-02-06 ENCOUNTER — Ambulatory Visit (INDEPENDENT_AMBULATORY_CARE_PROVIDER_SITE_OTHER): Payer: Medicare Other | Admitting: Emergency Medicine

## 2012-02-06 VITALS — BP 140/86 | HR 78 | Temp 98.8°F | Ht 64.0 in

## 2012-02-06 DIAGNOSIS — J4489 Other specified chronic obstructive pulmonary disease: Secondary | ICD-10-CM

## 2012-02-06 DIAGNOSIS — R059 Cough, unspecified: Secondary | ICD-10-CM

## 2012-02-06 DIAGNOSIS — R05 Cough: Secondary | ICD-10-CM

## 2012-02-06 DIAGNOSIS — J449 Chronic obstructive pulmonary disease, unspecified: Secondary | ICD-10-CM

## 2012-02-06 NOTE — Assessment & Plan Note (Signed)
Appears to stable, still limited exertional tolerance - continue the Spiriva - SABA prn

## 2012-02-06 NOTE — Assessment & Plan Note (Signed)
Still with occasional cough.  - has not been able to tolerate nasal steroid,

## 2012-02-06 NOTE — Progress Notes (Signed)
Subjective:    Patient ID: [name], female    DOB: 29-Dec-1929, 78 y.o.   MRN: [id number]  HPI Ms. Breit is a 78 year old woman with COPD that is in the mild to moderate range. She is followed by Dr Gala Romney for congestive heart failure secondary to nonischemic cardiomyopathy. Previous EF was 25-30%. She is status post BiV ICD, EF now 60%. She also has diastolic dysfunction and a history of pulmonary edema. Has OSA but unable to tolerate CPAP. Pacer changed   ROV 03/08/10 -- returns for her COPD, systolic/diastolic CHF, OSA. Since last visit she had reversal of her colostomy, still doesn't have her strength back. She still has cough. Has been using flutter valve, has been trying to practice swallowing precautions. She coughs whenever she takes her Advair. Seems to tolerate the Spiriva. She has had some increased LE edema, currently seems to be at baseline.   ROV 05/14/10 --  moderate COPD, NICM s/p BiV pacer, OSA (not on CPAP). Last time we discussed her cough, stopped Advair and continued Spiriva. Also started fluticasone nasal spray at bedtime. Her cough is better. Hasn't really missed the Advair, breathing is stable.   ROV 10/05/10 -- regular f/u for COPD, also hx systolic and diastolic CHF s/p BiV pacer, OSA not on CPAP. Just had BiV pacer changed on 09/03/10. She returns telling me that her SOB has slowly progressed over last 6 - 12 months. Not able to walk as far, not able to cook or clean. She has been coughing daily, not always productive. Hears wheeze at night and sometimes during the day, better with coughing up phlegm. Currently on Spiriva, stopped Advair. Not taking fluticasone spray. Hasn't tolerated mucinex before.   ROV 11/05/10 -- COPD, systolic/diastolic CHF, biV pacer, OSA not on CPAP. Last time we added Symbicort to Spiriva. Her breathing has benefitted, SABA use has decreased to 2x a day. ? having some palpitations. Still limited with exertion. Wakes up at night and uses the  SABA, helps her. Still coughing phlegm every day. Fluticasone spray may have helped some.   ROV 02/13/11 -- COPD (moderate), systolic and diastolic CHF + BiV pacer, OSA not on CPAP. She is currently on Spiriva + Symbicort. Using SABA about 2x a day. Only using fluticasone nasal occasionally because it was putting sores in her nose - it did help her drainage and cough.  Taking benadryl every night. She is on omeprazole bid.   ROV 03/19/11 -- hx of COPD,  systolic and diastolic CHF + BiV pacer, OSA not on CPAP. Last time we stopped symbicort and continued Spiriva, repeated PFT's. Since last visit she has continued to have dyspnea. On Sunday she had an episode that was associated with CP. She was started on daliresp mid-July by Dr Oneta Rack. She believes that it may have helped her, although her cough is worse. We changed the fluticasone nose spray to nasonex, but she couldn't tolerate due to sores in her nose. She did the [place] x 1, but couldn't continue it. Her PFT today show moderate AFL, little change FEV1 from priors.   ROV 02/06/12 -- Hx of COPD,  systolic and diastolic CHF + BiV pacer, OSA not on CPAP. Has been continue on Spiriva. Was on daliresp, she isn't sure when that was stopped, but she is no longer taking.    Review of Systems As per HPI    Objective:   Physical Exam Gen: Pleasant, well-nourished, in no distress,  normal affect  ENT: No lesions,  mouth clear,  oropharynx clear, no postnasal drip  Neck: No JVD, no TMG, no carotid bruits  Lungs: No use of accessory muscles, no dullness to percussion, clear without rales or rhonchi  Cardiovascular: RRR, heart sounds normal, no murmur or gallops, no peripheral edema  Musculoskeletal: No deformities, no cyanosis or clubbing  Neuro: alert, non focal  Skin: Warm, no lesions or rashes     Assessment & Plan:  COPD Appears to stable, still limited exertional tolerance - continue the Spiriva - SABA prn  COUGH Still with occasional  cough.  - has not been able to tolerate nasal steroid,

## 2012-02-06 NOTE — Patient Instructions (Addendum)
Please continue your Spiriva  Follow with Dr Delton Coombes in 6 months or sooner if you have any problems.

## 2012-02-10 ENCOUNTER — Encounter (INDEPENDENT_AMBULATORY_CARE_PROVIDER_SITE_OTHER): Payer: Self-pay | Admitting: General Surgery

## 2012-02-10 ENCOUNTER — Ambulatory Visit (INDEPENDENT_AMBULATORY_CARE_PROVIDER_SITE_OTHER): Payer: Medicare Other | Admitting: General Surgery

## 2012-02-10 VITALS — BP 150/82 | HR 76 | Temp 98.2°F | Resp 18 | Ht 64.0 in | Wt 130.0 lb

## 2012-02-10 DIAGNOSIS — K432 Incisional hernia without obstruction or gangrene: Secondary | ICD-10-CM

## 2012-02-10 NOTE — Progress Notes (Signed)
Patient ID: [name], female   DOB: 23-Dec-1929, 78 y.o.   MRN: [id number]  Chief Complaint  Patient presents with  . Follow-up    yrly hernia reck    HPI [name] is a 78 y.o. female.  She returns for a one year followup regarding her abdominal incisional pain and small ventral hernia.  This patient has had complex two-stage operation for diverticulitis. She has a significant number of medical problems and comorbidities which make her risk of surgery significant.  She had a recent left ankle fracture treated by Dr. Margreta Journey.  Her appetite is reasonable. Her weight is stable. She has daily bowel movements. No nausea or vomiting. .There have been no complications of her abdominal incisions otherwise, however. HPI  Past Medical History  Diagnosis Date  . CONGESTIVE HEART FAILURE 06/03/2007  . COPD 06/03/2007  . HYPERTENSION 06/03/2007  . HYPOTHYROIDISM 06/03/2007  . Hernia   . Asthma   . Diabetes mellitus   . Hyperlipidemia   . Arthritis   . Myocardial infarction   . Osteoporosis     Past Surgical History  Procedure Date  . Cardiac defibrillator placement   . Appendectomy   . Hysterectomy - unknown type   . Posterior fusion clivus-c2 transarticular stealth guided w/ icbg   . Cystectomy     elbow  . Icd     medtronic maximo  . Polypectomy     bladder    Family History  Problem Relation Age of Onset  . Heart disease Mother   . Breast cancer Mother   . Brain cancer Mother     Social History History  Substance Use Topics  . Smoking status: Former Smoker -- 2.0 packs/day for 30 years    Quit date: 08/19/1996  . Smokeless tobacco: Not on file  . Alcohol Use: No    Allergies  Allergen Reactions  . Ace Inhibitors   . Acetaminophen-Codeine     REACTION: n/v  . Atorvastatin   . Bisoprolol Fumarate   . Ezetimibe-Simvastatin   . Fluvastatin Sodium   . Gemfibrozil   . Lisinopril     REACTION: unspecified  . Oxycodone Hcl     REACTION:  vomiting  . Rosuvastatin     Current Outpatient Prescriptions  Medication Sig Dispense Refill  . albuterol (VENTOLIN HFA) 108 (90 BASE) MCG/ACT inhaler Inhale 2 puffs into the lungs every 6 (six) hours as needed.      [name] DALIRESP 500 MCG TABS tablet Once daily      . diltiazem (CARDIZEM CD) 240 MG 24 hr capsule Take 240 mg by mouth daily.       [name] DIOVAN HCT 160-12.5 MG per tablet take 1 tablet once daily  30 tablet  6  . diphenhydrAMINE (BENADRYL) 25 MG tablet Take 50 mg by mouth at bedtime as needed.        . fenofibrate micronized (LOFIBRA) 134 MG capsule Take 134 mg by mouth every other day.        [name] FREESTYLE LITE test strip       . glyBURIDE (DIABETA) 5 MG tablet Take 5 mg by mouth. If glucose > 200.      [name] levothyroxine (SYNTHROID, LEVOTHROID) 75 MCG tablet Take 75 mcg by mouth daily.        [name] omeprazole (PRILOSEC) 20 MG capsule Take 1 capsule by mouth 2 (two) times daily.       . polyethylene glycol (MIRALAX / GLYCOLAX) packet Take by mouth. 1/2  cup daily      . SPIRIVA HANDIHALER 18 MCG inhalation capsule inhale the contents of one capsule in the handihaler once daily  30 capsule  1  . Lancets (FREESTYLE) lancets Use as directed      . levalbuterol (XOPENEX HFA) 45 MCG/ACT inhaler Inhale 1-2 puffs into the lungs every 6 (six) hours as needed for wheezing.  1 Inhaler  3    Review of Systems Review of Systems  Constitutional: Negative for fever, chills and unexpected weight change.  HENT: Negative for hearing loss, congestion, sore throat, trouble swallowing and voice change.   Eyes: Negative for visual disturbance.  Respiratory: Positive for cough and shortness of breath. Negative for wheezing.   Cardiovascular: Negative for chest pain, palpitations and leg swelling.  Gastrointestinal: Positive for abdominal pain. Negative for nausea, vomiting, diarrhea, constipation, blood in stool, abdominal distention and anal bleeding.  Genitourinary: Negative for hematuria, vaginal bleeding  and difficulty urinating.  Musculoskeletal: Negative for arthralgias.  Skin: Negative for rash and wound.  Neurological: Negative for seizures, syncope and headaches.  Hematological: Negative for adenopathy. Does not bruise/bleed easily.  Psychiatric/Behavioral: Negative for confusion.    Blood pressure 150/82, pulse 76, temperature 98.2 F (36.8 C), temperature source Temporal, resp. rate 18, height 5\' 4"  (1.626 m), weight 130 lb (58.968 kg).  Physical Exam Physical Exam  Constitutional: She appears well-developed and well-nourished. No distress.  HENT:  Head: Normocephalic and atraumatic.  Eyes: Conjunctivae and EOM are normal. Pupils are equal, round, and reactive to light. Left eye exhibits no discharge. No scleral icterus.  Cardiovascular: Normal rate, normal heart sounds and intact distal pulses.   No murmur heard. Pulmonary/Chest: Effort normal and breath sounds normal. No respiratory distress. She has no wheezes. She has no rales. She exhibits no tenderness.  Abdominal: Soft. Bowel sounds are normal. She exhibits no distension and no mass. There is no tenderness. There is no rebound and no guarding.       Midline scar and left lower quadrant scar at colostomy site. In the lower midline there is a tiny, reducible, nontender incisional hernia. The defect is less than 2 cm. Unchanged from one year ago. No other hernias elsewhere.  Nontender.  Musculoskeletal: She exhibits no edema and no tenderness.  Skin: Skin is warm. No rash noted. She is not diaphoretic. No erythema. No pallor.  Psychiatric: She has a normal mood and affect. Her behavior is normal. Judgment and thought content normal.    Data Reviewed Old reco5s. Epic notes from other physicians.  Assessment    Ventral incisional hernia, lower midline, reducible, minimally symptomatic  Severe COPD.  Nonischemic cardiomyopathy  Congestive heart failure  AICD placement with AICD revision 2012 Dr.  Graciela Husbands  Hypertension  Chronic  renal insufficiency  History 2-stage resection for complicated diverticulitis.  Last colonoscopy 2011.    Plan    We had a lengthy discussion, and decided that we would not recommend surgery to repair her ventral hernia at this time. She agrees with this wholeheartedly and says that her symptoms are not that bad.  She is aware that she may develop progression in the size, pain or even incarceration requiring surgery, but that at this time the risk of surgery is much greater than the risk of observation.        Angelia Mould. Derrell Lolling, M.D., [facility] General and Minimally invasive Surgery Breast and Colorectal Surgery Office:   [phone] Pager:   [phone]  02/10/2012, 10:35 AM

## 2012-02-10 NOTE — Patient Instructions (Signed)
You have a small incisional hernia in the lower midline incision. I do not find any hernias elsewhere. The hernia is no larger and no more painful than it was one year ago.  Because of the risk of surgery, we are going to recommend a wait and see approach about the ventral hernia. If it were to become larger or more painful than we could reconsider an operation. At this time the risk of surgery is much greater than the risk of doing nothing.  Return to see Dr. Derrell Lolling if further problems arise.

## 2012-04-08 ENCOUNTER — Other Ambulatory Visit: Payer: Self-pay | Admitting: Emergency Medicine

## 2012-05-04 ENCOUNTER — Encounter: Payer: Medicare Other | Admitting: *Deleted

## 2012-05-07 ENCOUNTER — Encounter: Payer: Self-pay | Admitting: Cardiology

## 2012-05-07 ENCOUNTER — Encounter: Payer: Self-pay | Admitting: Internal Medicine

## 2012-05-07 ENCOUNTER — Ambulatory Visit (INDEPENDENT_AMBULATORY_CARE_PROVIDER_SITE_OTHER): Payer: Medicare Other | Admitting: Cardiology

## 2012-05-07 VITALS — BP 156/86 | HR 84 | Ht 64.0 in | Wt 129.8 lb

## 2012-05-07 DIAGNOSIS — I429 Cardiomyopathy, unspecified: Secondary | ICD-10-CM

## 2012-05-07 DIAGNOSIS — T82198A Other mechanical complication of other cardiac electronic device, initial encounter: Secondary | ICD-10-CM

## 2012-05-07 DIAGNOSIS — T82110A Breakdown (mechanical) of cardiac electrode, initial encounter: Secondary | ICD-10-CM

## 2012-05-07 DIAGNOSIS — Z950 Presence of cardiac pacemaker: Secondary | ICD-10-CM

## 2012-05-07 DIAGNOSIS — I5032 Chronic diastolic (congestive) heart failure: Secondary | ICD-10-CM

## 2012-05-07 LAB — PACEMAKER DEVICE OBSERVATION
AL IMPEDENCE PM: 380 Ohm
ATRIAL PACING PM: 0.1
BAMS-0001: 170 {beats}/min
BATTERY VOLTAGE: 2.98 V

## 2012-05-07 NOTE — Patient Instructions (Addendum)
Your physician recommends that you schedule a follow-up appointment in: 3 months with [place], [place]

## 2012-05-08 NOTE — Progress Notes (Signed)
ELECTROPHYSIOLOGY OFFICE NOTE  Patient ID: [name] MRN: [id number], DOB/AGE: 01/17/1930   Date of Visit: 05/08/2012  Primary Physician: Reinaldo Meeker, MD  Primary EP: Graciela Husbands, MD Reason for Visit: Device follow-up; [facility] alert  History of Present Illness* [name] is a pleasant 78 year old woman with a NICM s/p BiV ICD implant previously with generator change to CRT-P in 2012 who presents for device follow-up after [facility] alert indicated her LV lead impedance was high. She denies any cardiac complaints today. She denies CP, SOB, palpitations, dizziness or syncope. She denies LE swelling, orthopnea or PND. She denies ICD shocks.    Past Medical History Past Medical History  Diagnosis Date  . CONGESTIVE HEART FAILURE 06/03/2007  . COPD 06/03/2007  . HYPERTENSION 06/03/2007  . HYPOTHYROIDISM 06/03/2007  . Hernia   . Asthma   . Diabetes mellitus   . Hyperlipidemia   . Arthritis   . Myocardial infarction   . Osteoporosis     Past Surgical History Past Surgical History  Procedure Date  . Cardiac defibrillator placement   . Appendectomy   . Hysterectomy - unknown type   . Posterior fusion clivus-c2 transarticular stealth guided w/ icbg   . Cystectomy     elbow  . Icd     medtronic maximo  . Polypectomy     bladder     Allergies/Intolerances Allergies  Allergen Reactions  . Ace Inhibitors   . Acetaminophen-Codeine     REACTION: n/v  . Atorvastatin   . Bisoprolol Fumarate   . Ezetimibe-Simvastatin   . Fluvastatin Sodium   . Gemfibrozil   . Lisinopril     REACTION: unspecified  . Oxycodone Hcl     REACTION: vomiting  . Rosuvastatin     Current Home Medications Current Outpatient Prescriptions  Medication Sig Dispense Refill  . DALIRESP 500 MCG TABS tablet Once daily      . diltiazem (CARDIZEM CD) 240 MG 24 hr capsule Take 240 mg by mouth daily.       [name] DIOVAN HCT 160-12.5 MG per tablet take 1 tablet once daily  30 tablet  6  .  diphenhydrAMINE (BENADRYL) 25 MG tablet Take 50 mg by mouth at bedtime as needed.        . fenofibrate micronized (LOFIBRA) 134 MG capsule Take 134 mg by mouth every other day.        [name] FREESTYLE LITE test strip       . glyBURIDE (DIABETA) 5 MG tablet Take 5 mg by mouth. If glucose > 200.      [name] Lancets (FREESTYLE) lancets Use as directed      . levalbuterol (XOPENEX HFA) 45 MCG/ACT inhaler Inhale 1-2 puffs into the lungs every 6 (six) hours as needed for wheezing.  1 Inhaler  3  . levothyroxine (SYNTHROID, LEVOTHROID) 75 MCG tablet Take 75 mcg by mouth daily.        [name] omeprazole (PRILOSEC) 20 MG capsule Take 1 capsule by mouth 2 (two) times daily.       . polyethylene glycol (MIRALAX / GLYCOLAX) packet Take by mouth. 1/2 cup daily      . SPIRIVA HANDIHALER 18 MCG inhalation capsule inhale the contents of one capsule in the handihaler once daily  30 capsule  6    Social History Social History  . Marital Status: Married   Occupational History  . retired Air traffic controller    Social History Main Topics  . Smoking status: Former Smoker -- 2.0  packs/day for 30 years    Quit date: 08/19/1996  . Smokeless tobacco: No  . Alcohol Use: No  . Drug Use: No   Review of Systems General: No chills, fever, night sweats or weight changes Cardiovascular: No chest pain, dyspnea on exertion, edema, orthopnea, palpitations, paroxysmal nocturnal dyspnea Dermatological: No rash, lesions or masses Respiratory: No cough, dyspnea Urologic: No hematuria, dysuria Abdominal: No nausea, vomiting, diarrhea, bright red blood per rectum, melena, or hematemesis Neurologic: No visual changes, weakness, changes in mental status All other systems reviewed and are otherwise negative except as noted above.  Physical Exam Blood pressure 156/86, pulse 84, height 5\' 4"  (1.626 m), weight 129 lb 12.8 oz (58.877 kg), SpO2 99.00%.  General: Well developed, well appearing 78 year old female in no acute distress. HEENT:  Normocephalic, atraumatic. EOMs intact. Sclera nonicteric. Oropharynx clear.  Neck: Supple. No JVD. Lungs: Respirations regular and unlabored, CTA bilaterally. No wheezes, rales or rhonchi. Heart: RRR. S1, S2 present. No murmurs, rub, S3 or S4. Abdomen: Soft, non-distended.  Extremities: No clubbing, cyanosis or edema. DP/PT/Radials 2+ and equal bilaterally. Psych: Normal affect. Neuro: Alert and oriented X 3. Moves all extremities spontaneously.   Diagnostics Device interrogation shows normal BiV PPM function with good battery status and stable lead measurements of the atrial and RV leads (of note, at the time of her generator change to BiV PPM, the RV lead which is a 6949 lead was placed in the LV port; she is truly LV pacing with the 4194 lead which is in the RV port); her LV lead impedance measures >3000 ohms, which is the 6949 lead, and her LV threshold measured 6V at 1.77ms; LV pacing was turned off; no other programming changes made; see PaceArt report  Assessment and Plan 1. NICM with chronic systolic HF, BiV PPM in place [name] appears to have LV lead failure, probable fracture (known [phone] lead); however, this lead was placed in the RV port at the time of her generator change for this reason. She is now LV pacing only with the 4194 lead, which was placed in the RV port and measures appropriately today.  Based on recent data, including ACRT, LV only pacing was not felt to be inferior to BiV pacing; therefore, we have turned off pacing using the 6949 lead and will allow her to only LV pace using the LV lead that was placed into the RV port. She will return to clinic in 3 months for follow-up.  2. Chronic systolic HF [name] appears euvolemic by exam today. She will continue medical therapy.  This plan of care was reviewed and formulated with Dr. Berton Mount. Signed, Rick Duff, PA-C 05/08/2012, 12:48 PM

## 2012-07-03 ENCOUNTER — Ambulatory Visit
Admission: RE | Admit: 2012-07-03 | Discharge: 2012-07-03 | Disposition: A | Discharge status: Home / Self Care | Payer: Medicare Other | Source: Ambulatory Visit | Attending: Internal Medicine | Admitting: Internal Medicine

## 2012-07-03 ENCOUNTER — Other Ambulatory Visit: Payer: Self-pay | Admitting: Internal Medicine

## 2012-07-03 DIAGNOSIS — R51 Headache: Secondary | ICD-10-CM

## 2012-07-03 DIAGNOSIS — R42 Dizziness and giddiness: Secondary | ICD-10-CM

## 2012-08-03 ENCOUNTER — Encounter: Payer: Self-pay | Admitting: Internal Medicine

## 2012-08-06 ENCOUNTER — Ambulatory Visit (INDEPENDENT_AMBULATORY_CARE_PROVIDER_SITE_OTHER): Payer: Medicare Other | Admitting: *Deleted

## 2012-08-06 ENCOUNTER — Encounter (INDEPENDENT_AMBULATORY_CARE_PROVIDER_SITE_OTHER): Payer: Medicare Other | Admitting: *Deleted

## 2012-08-06 DIAGNOSIS — I429 Cardiomyopathy, unspecified: Secondary | ICD-10-CM

## 2012-08-06 DIAGNOSIS — R0989 Other specified symptoms and signs involving the circulatory and respiratory systems: Secondary | ICD-10-CM

## 2012-08-06 DIAGNOSIS — I5032 Chronic diastolic (congestive) heart failure: Secondary | ICD-10-CM

## 2012-08-06 LAB — PACEMAKER DEVICE OBSERVATION
AL AMPLITUDE: 3.625 mv
BAMS-0001: 170 {beats}/min
RV LEAD AMPLITUDE: 8.25 mv
RV LEAD THRESHOLD: 1.75 V

## 2012-08-06 NOTE — Progress Notes (Signed)
PPM check with ICM

## 2012-08-24 ENCOUNTER — Ambulatory Visit (INDEPENDENT_AMBULATORY_CARE_PROVIDER_SITE_OTHER): Payer: Medicare Other | Admitting: Emergency Medicine

## 2012-08-24 ENCOUNTER — Encounter: Payer: Self-pay | Admitting: Emergency Medicine

## 2012-08-24 VITALS — BP 130/82 | HR 84 | Temp 98.0°F | Ht 64.0 in | Wt 133.4 lb

## 2012-08-24 DIAGNOSIS — J449 Chronic obstructive pulmonary disease, unspecified: Secondary | ICD-10-CM

## 2012-08-24 DIAGNOSIS — R0609 Other forms of dyspnea: Secondary | ICD-10-CM | POA: Insufficient documentation

## 2012-08-24 NOTE — Assessment & Plan Note (Signed)
She has moderate obstruction on PFT, but I suspect her dyspnea and fatigue are multifactorial. She states that she has "not felt right" since she was changed from BiV to LV pacing in 9/13. ? Whether she can tell a subtle difference with the change. We will continue to treat her COPD with scheduled BD's, xopenex. I asked her to review the sx, especially the standing fatigue and pre-syncope with Dr Graciela Husbands

## 2012-08-24 NOTE — Progress Notes (Signed)
Subjective:    Patient ID: [name], female    DOB: 08-Oct-1929, 79 y.o.   MRN: [id number]  HPI [name] is a 79 year old woman with COPD that is in the mild to moderate range. She is followed by Dr Gala Romney for congestive heart failure secondary to nonischemic cardiomyopathy. Previous EF was 25-30%. She is status post BiV ICD, EF now 60%. She also has diastolic dysfunction and a history of pulmonary edema. Has OSA but unable to tolerate CPAP. Pacer changed   ROV 03/08/10 -- returns for her COPD, systolic/diastolic CHF, OSA. Since last visit she had reversal of her colostomy, still doesn't have her strength back. She still has cough. Has been using flutter valve, has been trying to practice swallowing precautions. She coughs whenever she takes her Advair. Seems to tolerate the Spiriva. She has had some increased LE edema, currently seems to be at baseline.   ROV 05/14/10 --  moderate COPD, NICM s/p BiV pacer, OSA (not on CPAP). Last time we discussed her cough, stopped Advair and continued Spiriva. Also started fluticasone nasal spray at bedtime. Her cough is better. Hasn't really missed the Advair, breathing is stable.   ROV 10/05/10 -- regular f/u for COPD, also hx systolic and diastolic CHF s/p BiV pacer, OSA not on CPAP. Just had BiV pacer changed on 09/03/10. She returns telling me that her SOB has slowly progressed over last 6 - 12 months. Not able to walk as far, not able to cook or clean. She has been coughing daily, not always productive. Hears wheeze at night and sometimes during the day, better with coughing up phlegm. Currently on Spiriva, stopped Advair. Not taking fluticasone spray. Hasn't tolerated mucinex before.   ROV 11/05/10 -- COPD, systolic/diastolic CHF, biV pacer, OSA not on CPAP. Last time we added Symbicort to Spiriva. Her breathing has benefitted, SABA use has decreased to 2x a day. ? having some palpitations. Still limited with exertion. Wakes up at night and uses the  SABA, helps her. Still coughing phlegm every day. Fluticasone spray may have helped some.   ROV 02/13/11 -- COPD (moderate), systolic and diastolic CHF + BiV pacer, OSA not on CPAP. She is currently on Spiriva + Symbicort. Using SABA about 2x a day. Only using fluticasone nasal occasionally because it was putting sores in her nose - it did help her drainage and cough.  Taking benadryl every night. She is on omeprazole bid.   ROV 03/19/11 -- hx of COPD,  systolic and diastolic CHF + BiV pacer, OSA not on CPAP. Last time we stopped symbicort and continued Spiriva, repeated PFT's. Since last visit she has continued to have dyspnea. On Sunday she had an episode that was associated with CP. She was started on daliresp mid-July by Dr Oneta Rack. She believes that it may have helped her, although her cough is worse. We changed the fluticasone nose spray to nasonex, but she couldn't tolerate due to sores in her nose. She did the [place] x 1, but couldn't continue it. Her PFT today show moderate AFL, little change FEV1 from priors.   ROV 02/06/12 -- Hx of COPD,  systolic and diastolic CHF + BiV pacer, OSA not on CPAP. Has been continue on Spiriva. Was on daliresp, she isn't sure when that was stopped, but she is no longer taking.   ROV 08/24/12 -- Hx of COPD,  systolic and diastolic CHF + BiV pacer, OSA not on CPAP. She is followed by Dr Graciela Husbands for her CHF and pacer -  notes indicate that she is no longer BiV paced, is now only LV paced, changed in 9/13. She states that she is having more exertional SOB with low energy for the last 2 months. She can't stand up to cook - says she feels like she is going to pass out if she stands there too long. She is having chest tightness. She continues to take Spiriva. Has been using xopenex prn with some relief.  Lyrica is new, was started in December for diabetic neuropathy. Daliresp is on her med list, but she doesn't believe she is taking - can't recall what it is   Objective:    Physical Exam Filed Vitals:   08/24/12 1422  BP: 130/82  Pulse: 84  Temp: 98 F (36.7 C)   Gen: Pleasant, well-nourished, in no distress,  normal affect  ENT: No lesions,  mouth clear,  oropharynx clear, no postnasal drip  Neck: No JVD, no TMG, no carotid bruits  Lungs: No use of accessory muscles, no dullness to percussion, clear without rales or rhonchi  Cardiovascular: RRR, heart sounds normal, no murmur or gallops, no peripheral edema  Musculoskeletal: No deformities, no cyanosis or clubbing  Neuro: alert, non focal  Skin: Warm, no lesions or rashes     Assessment & Plan:  COPD Continue spiriva and xopenex. Daliresp is on her med list, but she does not recognize the medication and I do not recall starting it for her. We will d/c from her list. Follow 6 mon  Dyspnea on exertion She has moderate obstruction on PFT, but I suspect her dyspnea and fatigue are multifactorial. She states that she has "not felt right" since she was changed from BiV to LV pacing in 9/13. ? Whether she can tell a subtle difference with the change. We will continue to treat her COPD with scheduled BD's, xopenex. I asked her to review the sx, especially the standing fatigue and pre-syncope with Dr Graciela Husbands

## 2012-08-24 NOTE — Assessment & Plan Note (Signed)
Continue spiriva and xopenex. Daliresp is on her med list, but she does not recognize the medication and I do not recall starting it for her. We will d/c from her list. Follow 6 mon

## 2012-08-24 NOTE — Patient Instructions (Signed)
Please continue your Spiriva and xopenex as you are using them  Discuss your lightheadedness and shortness of breath with Dr Graciela Husbands at your next visit. Your heart may also be playing a part in your energy level and breathing.  Follow with Dr Delton Coombes in 6 months or sooner if you have any problems

## 2012-08-27 ENCOUNTER — Encounter: Payer: Self-pay | Admitting: Internal Medicine

## 2012-10-06 ENCOUNTER — Ambulatory Visit
Admission: RE | Admit: 2012-10-06 | Discharge: 2012-10-06 | Disposition: A | Discharge status: Home / Self Care | Payer: Medicare Other | Source: Ambulatory Visit | Attending: Internal Medicine | Admitting: Internal Medicine

## 2012-10-06 ENCOUNTER — Other Ambulatory Visit: Payer: Self-pay | Admitting: Internal Medicine

## 2012-10-06 DIAGNOSIS — R52 Pain, unspecified: Secondary | ICD-10-CM

## 2012-10-06 DIAGNOSIS — R609 Edema, unspecified: Secondary | ICD-10-CM

## 2012-10-27 ENCOUNTER — Emergency Department (HOSPITAL_COMMUNITY): Payer: Medicare Other

## 2012-10-27 ENCOUNTER — Encounter (HOSPITAL_COMMUNITY): Payer: Self-pay | Admitting: *Deleted

## 2012-10-27 ENCOUNTER — Inpatient Hospital Stay (HOSPITAL_COMMUNITY)
Admission: EM | Admit: 2012-10-27 | Discharge: 2012-11-06 | DRG: 388 | Disposition: A | Discharge status: Skilled Nursing Facility | Payer: Medicare Other | Attending: Internal Medicine | Admitting: Internal Medicine

## 2012-10-27 DIAGNOSIS — I5033 Acute on chronic diastolic (congestive) heart failure: Secondary | ICD-10-CM | POA: Diagnosis present

## 2012-10-27 DIAGNOSIS — E43 Unspecified severe protein-calorie malnutrition: Secondary | ICD-10-CM | POA: Diagnosis present

## 2012-10-27 DIAGNOSIS — J449 Chronic obstructive pulmonary disease, unspecified: Secondary | ICD-10-CM

## 2012-10-27 DIAGNOSIS — R05 Cough: Secondary | ICD-10-CM

## 2012-10-27 DIAGNOSIS — K432 Incisional hernia without obstruction or gangrene: Secondary | ICD-10-CM

## 2012-10-27 DIAGNOSIS — G4736 Sleep related hypoventilation in conditions classified elsewhere: Secondary | ICD-10-CM

## 2012-10-27 DIAGNOSIS — I5022 Chronic systolic (congestive) heart failure: Secondary | ICD-10-CM | POA: Diagnosis present

## 2012-10-27 DIAGNOSIS — I10 Essential (primary) hypertension: Secondary | ICD-10-CM

## 2012-10-27 DIAGNOSIS — E039 Hypothyroidism, unspecified: Secondary | ICD-10-CM | POA: Diagnosis present

## 2012-10-27 DIAGNOSIS — K529 Noninfective gastroenteritis and colitis, unspecified: Secondary | ICD-10-CM

## 2012-10-27 DIAGNOSIS — R109 Unspecified abdominal pain: Secondary | ICD-10-CM | POA: Diagnosis present

## 2012-10-27 DIAGNOSIS — I2789 Other specified pulmonary heart diseases: Secondary | ICD-10-CM

## 2012-10-27 DIAGNOSIS — Z950 Presence of cardiac pacemaker: Secondary | ICD-10-CM

## 2012-10-27 DIAGNOSIS — K56609 Unspecified intestinal obstruction, unspecified as to partial versus complete obstruction: Secondary | ICD-10-CM

## 2012-10-27 DIAGNOSIS — K565 Intestinal adhesions [bands], unspecified as to partial versus complete obstruction: Principal | ICD-10-CM | POA: Diagnosis present

## 2012-10-27 DIAGNOSIS — IMO0002 Reserved for concepts with insufficient information to code with codable children: Secondary | ICD-10-CM

## 2012-10-27 DIAGNOSIS — E876 Hypokalemia: Secondary | ICD-10-CM | POA: Diagnosis present

## 2012-10-27 DIAGNOSIS — I429 Cardiomyopathy, unspecified: Secondary | ICD-10-CM

## 2012-10-27 DIAGNOSIS — I509 Heart failure, unspecified: Secondary | ICD-10-CM | POA: Diagnosis present

## 2012-10-27 DIAGNOSIS — D638 Anemia in other chronic diseases classified elsewhere: Secondary | ICD-10-CM | POA: Diagnosis present

## 2012-10-27 DIAGNOSIS — E538 Deficiency of other specified B group vitamins: Secondary | ICD-10-CM | POA: Diagnosis present

## 2012-10-27 DIAGNOSIS — K259 Gastric ulcer, unspecified as acute or chronic, without hemorrhage or perforation: Secondary | ICD-10-CM | POA: Diagnosis present

## 2012-10-27 DIAGNOSIS — Z79899 Other long term (current) drug therapy: Secondary | ICD-10-CM

## 2012-10-27 DIAGNOSIS — E872 Acidosis, unspecified: Secondary | ICD-10-CM | POA: Diagnosis present

## 2012-10-27 DIAGNOSIS — I252 Old myocardial infarction: Secondary | ICD-10-CM

## 2012-10-27 DIAGNOSIS — E119 Type 2 diabetes mellitus without complications: Secondary | ICD-10-CM | POA: Diagnosis present

## 2012-10-27 DIAGNOSIS — I428 Other cardiomyopathies: Secondary | ICD-10-CM | POA: Diagnosis present

## 2012-10-27 DIAGNOSIS — J4489 Other specified chronic obstructive pulmonary disease: Secondary | ICD-10-CM | POA: Diagnosis present

## 2012-10-27 DIAGNOSIS — I5031 Acute diastolic (congestive) heart failure: Secondary | ICD-10-CM

## 2012-10-27 DIAGNOSIS — I4891 Unspecified atrial fibrillation: Secondary | ICD-10-CM

## 2012-10-27 DIAGNOSIS — I5032 Chronic diastolic (congestive) heart failure: Secondary | ICD-10-CM

## 2012-10-27 DIAGNOSIS — D649 Anemia, unspecified: Secondary | ICD-10-CM | POA: Diagnosis present

## 2012-10-27 LAB — URINALYSIS, ROUTINE W REFLEX MICROSCOPIC
Bilirubin Urine: NEGATIVE
Ketones, ur: NEGATIVE mg/dL
Leukocytes, UA: NEGATIVE
Nitrite: NEGATIVE
Protein, ur: NEGATIVE mg/dL
Urobilinogen, UA: 0.2 mg/dL (ref 0.0–1.0)

## 2012-10-27 LAB — COMPREHENSIVE METABOLIC PANEL
AST: 13 U/L (ref 0–37)
Albumin: 2.5 g/dL — ABNORMAL LOW (ref 3.5–5.2)
Chloride: 98 mEq/L (ref 96–112)
Creatinine, Ser: 1.08 mg/dL (ref 0.50–1.10)
Potassium: 3.5 mEq/L (ref 3.5–5.1)
Total Bilirubin: 0.2 mg/dL — ABNORMAL LOW (ref 0.3–1.2)
Total Protein: 7 g/dL (ref 6.0–8.3)

## 2012-10-27 LAB — CBC WITH DIFFERENTIAL/PLATELET
Basophils Absolute: 0 10*3/uL (ref 0.0–0.1)
Basophils Relative: 0 % (ref 0–1)
MCHC: 31.6 g/dL (ref 30.0–36.0)
Monocytes Absolute: 1.5 10*3/uL — ABNORMAL HIGH (ref 0.1–1.0)
Neutro Abs: 13.3 10*3/uL — ABNORMAL HIGH (ref 1.7–7.7)
Neutrophils Relative %: 81 % — ABNORMAL HIGH (ref 43–77)
RDW: 15.3 % (ref 11.5–15.5)

## 2012-10-27 LAB — GLUCOSE, CAPILLARY

## 2012-10-27 MED ORDER — IOHEXOL 300 MG/ML  SOLN
50.0000 mL | Freq: Once | INTRAMUSCULAR | Status: AC | PRN
  Administered 2012-10-27: 50 mL via ORAL

## 2012-10-27 MED ORDER — SODIUM CHLORIDE 0.9 % IV SOLN
Freq: Once | INTRAVENOUS | Status: AC
  Administered 2012-10-27: 23:00:00 via INTRAVENOUS

## 2012-10-27 MED ORDER — MORPHINE SULFATE 4 MG/ML IJ SOLN
2.0000 mg | Freq: Once | INTRAMUSCULAR | Status: AC
  Administered 2012-10-27: 2 mg via INTRAVENOUS
  Filled 2012-10-27: qty 1

## 2012-10-27 MED ORDER — MORPHINE SULFATE 2 MG/ML IJ SOLN
2.0000 mg | Freq: Once | INTRAMUSCULAR | Status: AC
  Administered 2012-10-27: 2 mg via INTRAVENOUS
  Filled 2012-10-27: qty 1

## 2012-10-27 MED ORDER — IOHEXOL 300 MG/ML  SOLN
100.0000 mL | Freq: Once | INTRAMUSCULAR | Status: AC | PRN
  Administered 2012-10-27: 100 mL via INTRAVENOUS

## 2012-10-27 MED ORDER — SODIUM CHLORIDE 0.9 % IV BOLUS (SEPSIS)
500.0000 mL | Freq: Once | INTRAVENOUS | Status: AC
  Administered 2012-10-27: 500 mL via INTRAVENOUS

## 2012-10-27 MED ORDER — ONDANSETRON HCL 4 MG/2ML IJ SOLN
4.0000 mg | Freq: Once | INTRAMUSCULAR | Status: AC
  Administered 2012-10-27: 4 mg via INTRAVENOUS
  Filled 2012-10-27: qty 2

## 2012-10-27 NOTE — ED Notes (Signed)
Per EMS report: pt from home: pt c/o of abd distention and pain x 1 week. Tender to touch all over but mostly above her umbilicus.  Pt reports increased pain w/ movement.  Pt reports hx of hernia in umbilicus area and twisted bowels. Pt reports having loose stools and "darker in color." Pt having nausea today and could not keep her food down today. BP: 160/90, RR 18, HR: 100.

## 2012-10-27 NOTE — ED Notes (Signed)
Bed:WA25<BR> Expected date:<BR> Expected time:<BR> Means of arrival:<BR> Comments:<BR> EMS

## 2012-10-27 NOTE — ED Notes (Signed)
Patient transported to CT 

## 2012-10-27 NOTE — ED Provider Notes (Signed)
History     CSN: [id number]  Arrival date & time 10/27/12  1946   First MD Initiated Contact with Patient 10/27/12 2030      No chief complaint on file.   (Consider location/radiation/quality/duration/timing/severity/associated sxs/prior treatment) HPI History provided by pt and her daughter.  Pt reports progressively worsening abdominal pain for the past 7 days.  Diffuse but seems to originate in epigastrium.  Non-radiating.  No aggravating/alleviating factors.  Associated w/ N/V today.  Most recent BM this evening and was black in color.  She is passing gas.  No fever, CP, SOB, urinary or vaginal sx.  Past abd surgeries include cholecystectomy, appendectomy, hysterectomy, umbilical hernia repair and colon resection secondary to "twisted colon".   Past Medical History  Diagnosis Date  . CONGESTIVE HEART FAILURE 06/03/2007  . COPD 06/03/2007  . HYPERTENSION 06/03/2007  . HYPOTHYROIDISM 06/03/2007  . Hernia   . Asthma   . Diabetes mellitus   . Hyperlipidemia   . Arthritis   . Myocardial infarction   . Osteoporosis     Past Surgical History  Procedure Laterality Date  . Cardiac defibrillator placement    . Appendectomy    . Hysterectomy - unknown type    . Posterior fusion clivus-c2 transarticular stealth guided w/ icbg    . Cystectomy      elbow  . Icd      medtronic maximo  . Polypectomy      bladder    Family History  Problem Relation Age of Onset  . Heart disease Mother   . Breast cancer Mother   . Brain cancer Mother     History  Substance Use Topics  . Smoking status: Former Smoker -- 2.00 packs/day for 30 years    Quit date: 08/19/1996  . Smokeless tobacco: Never Used  . Alcohol Use: No    OB History   Grav Para Term Preterm Abortions TAB SAB Ect Mult Living                  Review of Systems  All other systems reviewed and are negative.    Allergies  Ace inhibitors; Acetaminophen-codeine; Atorvastatin; Bisoprolol fumarate;  Ezetimibe-simvastatin; Fluvastatin sodium; Gabapentin; Gemfibrozil; Lisinopril; Oxycodone hcl; and Rosuvastatin  Home Medications   Current Outpatient Rx  Name  Route  Sig  Dispense  Refill  . CEPHALEXIN PO   Oral   Take by mouth.         . clobetasol (TEMOVATE) 0.05 % external solution   Topical   Apply 1 application topically at bedtime.         [name] diltiazem (CARDIZEM CD) 240 MG 24 hr capsule   Oral   Take 240 mg by mouth daily.          . diphenhydrAMINE (BENADRYL) 25 MG tablet   Oral   Take 50 mg by mouth at bedtime as needed for sleep.          [name] glyBURIDE (DIABETA) 5 MG tablet   Oral   Take 5 mg by mouth. If glucose > 200.         . ketoconazole (NIZORAL) 2 % shampoo   Topical   Apply topically 3 (three) times a week.         . levalbuterol (XOPENEX HFA) 45 MCG/ACT inhaler   Inhalation   Inhale 1-2 puffs into the lungs 2 (two) times daily as needed for wheezing.         [name] levothyroxine (SYNTHROID, LEVOTHROID) 112 MCG  tablet   Oral   Take 112 mcg by mouth daily.         [name] omeprazole (PRILOSEC) 20 MG capsule   Oral   Take 1 capsule by mouth 2 (two) times daily.          . polyethylene glycol (MIRALAX / GLYCOLAX) packet   Oral   Take by mouth. 1/2 cup daily         . SPIRIVA HANDIHALER 18 MCG inhalation capsule      inhale the contents of one capsule in the handihaler once daily   30 capsule   6   . traMADol (ULTRAM) 50 MG tablet   Oral   Take 25-50 mg by mouth every 4 (four) hours as needed for pain.         [name] EXPIRED: levalbuterol (XOPENEX HFA) 45 MCG/ACT inhaler   Inhalation   Inhale 1-2 puffs into the lungs every 6 (six) hours as needed for wheezing.   1 Inhaler   3     BP 175/72  Pulse 94  Temp(Src) 97.9 F (36.6 C) (Oral)  Resp 23  SpO2 96%  Physical Exam  Nursing note and vitals reviewed. Constitutional: She is oriented to person, place, and time. She appears well-developed and well-nourished. No distress.  HENT:   Head: Normocephalic and atraumatic.  Dry mucous membranes  Eyes:  Normal appearance  Neck: Normal range of motion.  Cardiovascular: Normal rate and regular rhythm.   Pulmonary/Chest: Effort normal and breath sounds normal. No respiratory distress.  Abdominal: Soft. Bowel sounds are normal. She exhibits no mass. There is no rebound and no guarding.  Mild distension.  Diffuse ttp.   Genitourinary:  Bilateral CVA tenderness  Musculoskeletal: Normal range of motion.  Neurological: She is alert and oriented to person, place, and time.  Skin: Skin is warm and dry. No rash noted.  Psychiatric: She has a normal mood and affect. Her behavior is normal.    ED Course  Procedures (including critical care time)  Labs Reviewed  CBC WITH DIFFERENTIAL - Abnormal; Notable for the following:    WBC 16.4 (*)    Hemoglobin 9.9 (*)    HCT 31.3 (*)    MCH 25.0 (*)    Neutrophils Relative 81 (*)    Neutro Abs 13.3 (*)    Lymphocytes Relative 9 (*)    Monocytes Absolute 1.5 (*)    All other components within normal limits  COMPREHENSIVE METABOLIC PANEL - Abnormal; Notable for the following:    Sodium 134 (*)    Glucose, Bld 137 (*)    Albumin 2.5 (*)    Alkaline Phosphatase 133 (*)    Total Bilirubin 0.2 (*)    GFR calc non Af Amer 47 (*)    GFR calc Af Amer 54 (*)    All other components within normal limits  LIPASE, BLOOD  URINALYSIS, ROUTINE W REFLEX MICROSCOPIC  OCCULT BLOOD X 1 CARD TO LAB, STOOL   Ct Abdomen Pelvis W Contrast  10/27/2012  *RADIOLOGY REPORT*  Clinical Data: Abdominal pain and distension  CT ABDOMEN AND PELVIS WITH CONTRAST  Technique:  Multidetector CT imaging of the abdomen and pelvis was performed following the standard protocol during bolus administration of intravenous contrast.  Contrast: 50mL OMNIPAQUE IOHEXOL 300 MG/ML  SOLN, OMNIPAQUE IOHEXOL 300 MG/ML  SOLN  Comparison: 12/28/2010  Findings: Partially imaged pacing wires along the right ventricle and  coronary sinus.  Heart size within normal range.  Emphysematous changes at  the lung bases.  There are linear areas of scarring within the left lung base.  Unremarkable liver.  The gallbladder not visualized.  Minimal prominence of the CBD at 8 mm is nonspecific post cholecystectomy and similar to prior.  Unremarkable spleen, pancreas, adrenal glands.  Upper pole right renal cyst.  Lobular renal contours.  Areas of scarring.  No hydronephrosis or hydroureter.  Pelvic floor laxity.  Anterior abdominal wall hernia containing the anterior wall of a segment of transverse colon.  No CT evidence for incarceration or obstruction.  There is a short segment of jejunum with thick wall, favored to be secondary to incomplete distension. However, more distal jejunal loop within the midline lower abdomen shows mild peri visceral fat stranding and fluid.  Air fluid levels just proximally, and upper normal diameter at 2.9 cm.  The more distal small bowel loops are relatively decompressed.  No free intraperitoneal air.  No lymphadenopathy.  Portocaval lymph nodes measuring up to 9 mm short axis, nonspecific.  There is scattered atherosclerotic calcification of the aorta and its branches. No aneurysmal dilatation.  Retroaortic left renal vein.  Thin-walled bladder.  Absent uterus.  No adnexal mass.  Osteopenia and multilevel degenerative changes.  Heterogeneous attenuation of the marrow is nonspecific however similar to prior.  IMPRESSION: Segment of jejunum with perivisceral fat stranding and fluid. Most in keeping with a nonspecific enteritis (infectious, inflammatory, and ischemic considerations).  The more distal small bowel loops are decompressed.  This is favored to be due to delayed transit/ileus.  However, KUB follow-up to document passage of ingested contrast into the colon is recommended to exclude early obstruction.  Anterior abdominal wall hernia containing the anterior wall of a segment of transverse colon.  No CT  evidence for incarceration or obstruction.  Heterogeneous appearance to the bones is nonspecific.  May be secondary to osteopenia or renal osteodystrophy. An infiltrative marrow process (such as myeloma or lymphoma) is not excluded however not favored given the stability.   Original Report Authenticated By: Jearld Lesch, M.D.      1. Enteritis       MDM  [id number] F presents w/ abdominal pain/distension and N/V x 1 week.  Afebrile, NAD, dehydrated, abd soft but mildly distended and diffusely ttp on exam.  Some concern for SBO based on both history and exam.  Labs and CT abd/pelvis pending.  Pt receiving IVF, morphine and zofran.  Will reassess shortly.  9:00 PM   Pt tolerated her contrast but it aggravated her abd pain.  Another dose of 2mg  morphine ordered.  VSS.  Labs sig for slightly elevated alk phos as well as leukocytosis.  Ct pending.  10:09 PM   CT shows enteritis but SBO can not be ruled out.  Results discussed w/ pt.  [facility] has agreed to admit.  General surgery consulted as well and will see pt in ED.  12:24 AM         Otilio Miu, PA-C 10/28/12 1311

## 2012-10-28 ENCOUNTER — Inpatient Hospital Stay (HOSPITAL_COMMUNITY): Payer: Medicare Other

## 2012-10-28 DIAGNOSIS — K56609 Unspecified intestinal obstruction, unspecified as to partial versus complete obstruction: Secondary | ICD-10-CM

## 2012-10-28 DIAGNOSIS — E876 Hypokalemia: Secondary | ICD-10-CM | POA: Diagnosis present

## 2012-10-28 DIAGNOSIS — R109 Unspecified abdominal pain: Secondary | ICD-10-CM

## 2012-10-28 DIAGNOSIS — E119 Type 2 diabetes mellitus without complications: Secondary | ICD-10-CM

## 2012-10-28 DIAGNOSIS — D649 Anemia, unspecified: Secondary | ICD-10-CM

## 2012-10-28 LAB — VITAMIN B12: Vitamin B-12: 185 pg/mL — ABNORMAL LOW (ref 211–911)

## 2012-10-28 LAB — BASIC METABOLIC PANEL
BUN: 8 mg/dL (ref 6–23)
Chloride: 98 mEq/L (ref 96–112)
Creatinine, Ser: 1.01 mg/dL (ref 0.50–1.10)
GFR calc Af Amer: 59 mL/min — ABNORMAL LOW (ref >90)
Glucose, Bld: 116 mg/dL — ABNORMAL HIGH (ref 70–99)

## 2012-10-28 LAB — CBC
HCT: 34 % — ABNORMAL LOW (ref 36.0–46.0)
MCH: 25.1 pg — ABNORMAL LOW (ref 26.0–34.0)
MCHC: 31.2 g/dL (ref 30.0–36.0)
MCV: 80.6 fL (ref 78.0–100.0)
RDW: 15.4 % (ref 11.5–15.5)

## 2012-10-28 LAB — IRON AND TIBC: UIBC: 219 ug/dL (ref 125–400)

## 2012-10-28 LAB — RETICULOCYTES
RBC.: 4.18 MIL/uL (ref 3.87–5.11)
Retic Ct Pct: 0.9 % (ref 0.4–3.1)

## 2012-10-28 LAB — FERRITIN: Ferritin: 122 ng/mL (ref 10–291)

## 2012-10-28 MED ORDER — LEVOTHYROXINE SODIUM 100 MCG IV SOLR
50.0000 ug | Freq: Every day | INTRAVENOUS | Status: DC
  Filled 2012-10-28 (×2): qty 5

## 2012-10-28 MED ORDER — ACETAMINOPHEN 650 MG RE SUPP: 650.0000 mg | Freq: Four times a day (QID) | RECTAL | Status: DC | PRN

## 2012-10-28 MED ORDER — LEVOTHYROXINE SODIUM 200 MCG IV SOLR
50.0000 ug | Freq: Every day | INTRAVENOUS | Status: DC
  Administered 2012-10-28 – 2012-10-31 (×4): 52 ug via INTRAVENOUS
  Filled 2012-10-28 (×6): qty 5

## 2012-10-28 MED ORDER — PANTOPRAZOLE SODIUM 40 MG IV SOLR
40.0000 mg | INTRAVENOUS | Status: DC
  Administered 2012-10-28 – 2012-10-29 (×2): 40 mg via INTRAVENOUS
  Filled 2012-10-28 (×3): qty 40

## 2012-10-28 MED ORDER — ONDANSETRON HCL 4 MG/2ML IJ SOLN
4.0000 mg | Freq: Four times a day (QID) | INTRAMUSCULAR | Status: DC | PRN
  Administered 2012-10-28 – 2012-11-02 (×5): 4 mg via INTRAVENOUS
  Filled 2012-10-28 (×5): qty 2

## 2012-10-28 MED ORDER — ONDANSETRON HCL 4 MG PO TABS: 4.0000 mg | ORAL_TABLET | Freq: Four times a day (QID) | ORAL | Status: DC | PRN

## 2012-10-28 MED ORDER — HYDRALAZINE HCL 20 MG/ML IJ SOLN: 5.0000 mg | Freq: Four times a day (QID) | INTRAMUSCULAR | Status: DC | PRN

## 2012-10-28 MED ORDER — MORPHINE SULFATE 2 MG/ML IJ SOLN
2.0000 mg | INTRAMUSCULAR | Status: DC | PRN
  Administered 2012-10-28 – 2012-11-05 (×24): 2 mg via INTRAVENOUS
  Filled 2012-10-28 (×27): qty 1

## 2012-10-28 MED ORDER — POTASSIUM CHLORIDE IN NACL 40-0.9 MEQ/L-% IV SOLN
INTRAVENOUS | Status: DC
  Administered 2012-10-28 – 2012-11-02 (×8): via INTRAVENOUS
  Filled 2012-10-28 (×10): qty 1000

## 2012-10-28 MED ORDER — TIOTROPIUM BROMIDE MONOHYDRATE 18 MCG IN CAPS
18.0000 ug | ORAL_CAPSULE | Freq: Every day | RESPIRATORY_TRACT | Status: DC
  Administered 2012-10-28 – 2012-11-06 (×9): 18 ug via RESPIRATORY_TRACT
  Filled 2012-10-28 (×3): qty 5

## 2012-10-28 MED ORDER — ACETAMINOPHEN 325 MG PO TABS
650.0000 mg | ORAL_TABLET | Freq: Four times a day (QID) | ORAL | Status: DC | PRN
  Filled 2012-10-28: qty 2

## 2012-10-28 MED ORDER — SODIUM CHLORIDE 0.9 % IV SOLN
INTRAVENOUS | Status: DC
  Administered 2012-10-28 (×2): via INTRAVENOUS

## 2012-10-28 NOTE — H&P (Signed)
History and Physical  [name] AVW:[id number] DOB: 04-23-1930 DOA: 10/27/2012  Referring physician: Ruby Cola, PA PCP: Nadean Corwin, MD   Chief Complaint: Abdominal pain  HPI:  79 year old woman presented with progressive abdominal pain with some associated vomiting. Initial evaluation was notable for abnormal CT scan concerning for small bowel obstruction versus enteritis.  History obtained from patient, daughter who is staying with patient and husband at bedside. Patient with extensive abdominal surgery history  includingcolectomy in the past for diverticulitis. She reports her pain began about 7 days ago and has progressively worsened without aggravating or alleviating factors. The pain is generally across the abdomen. She has had some vomiting. She continue to have bowel movements as usual which she describes being 3 times per day. Over the last few days she has noted dark stools but denies bleeding.  In ED  Afebrile, VSS  Labs--AP 133, WBC 16.4, Hgb 9.9  EKG--paced rhythm  CT suggested enteritis or small bowel obstruction.  Review of Systems:  Negative for fever, visual changes, sore throat, rash, new muscle aches, chest pain, SOB, dysuria, bleeding.  Past Medical History  Diagnosis Date  . CONGESTIVE HEART FAILURE 06/03/2007  . COPD 06/03/2007  . HYPERTENSION 06/03/2007  . HYPOTHYROIDISM 06/03/2007  . Hernia   . Asthma   . Diabetes mellitus   . Hyperlipidemia   . Arthritis   . Myocardial infarction   . Osteoporosis     Past Surgical History  Procedure Laterality Date  . Cardiac defibrillator placement    . Appendectomy    . Hysterectomy - unknown type    . Posterior fusion clivus-c2 transarticular stealth guided w/ icbg    . Cystectomy      elbow  . Icd      medtronic maximo  . Polypectomy      bladder    Social History:  reports that she quit smoking about 16 years ago. She has never used smokeless tobacco. She reports that  she does not drink alcohol or use illicit drugs.  Allergies  Allergen Reactions  . Ace Inhibitors   . Acetaminophen-Codeine     REACTION: n/v  . Atorvastatin   . Bisoprolol Fumarate   . Ezetimibe-Simvastatin   . Fluvastatin Sodium   . Gabapentin   . Gemfibrozil   . Lisinopril     REACTION: unspecified  . Oxycodone Hcl     REACTION: vomiting  . Rosuvastatin     Family History  Problem Relation Age of Onset  . Heart disease Mother   . Breast cancer Mother   . Brain cancer Mother      Prior to Admission medications   Medication Sig Start Date End Date Taking? Authorizing Provider  CEPHALEXIN PO Take by mouth.   Yes Historical Provider, MD  clobetasol (TEMOVATE) 0.05 % external solution Apply 1 application topically at bedtime.   Yes Historical Provider, MD  diltiazem (CARDIZEM CD) 240 MG 24 hr capsule Take 240 mg by mouth daily.  11/22/10  Yes Historical Provider, MD  diphenhydrAMINE (BENADRYL) 25 MG tablet Take 50 mg by mouth at bedtime as needed for sleep.    Yes Historical Provider, MD  glyBURIDE (DIABETA) 5 MG tablet Take 5 mg by mouth. If glucose > 200. 02/18/11  Yes Historical Provider, MD  ketoconazole (NIZORAL) 2 % shampoo Apply topically 3 (three) times a week.   Yes Historical Provider, MD  levalbuterol (XOPENEX HFA) 45 MCG/ACT inhaler Inhale 1-2 puffs into the lungs 2 (two) times daily as needed  for wheezing.   Yes Historical Provider, MD  levothyroxine (SYNTHROID, LEVOTHROID) 112 MCG tablet Take 112 mcg by mouth daily.   Yes Historical Provider, MD  omeprazole (PRILOSEC) 20 MG capsule Take 1 capsule by mouth 2 (two) times daily.    Yes Historical Provider, MD  polyethylene glycol (MIRALAX / GLYCOLAX) packet Take by mouth. 1/2 cup daily   Yes Historical Provider, MD  SPIRIVA HANDIHALER 18 MCG inhalation capsule inhale the contents of one capsule in the handihaler once daily 04/08/12  Yes Leslye Peer, MD  traMADol (ULTRAM) 50 MG tablet Take 25-50 mg by mouth every 4  (four) hours as needed for pain.   Yes Historical Provider, MD  levalbuterol [facility] HFA) 45 MCG/ACT inhaler Inhale 1-2 puffs into the lungs every 6 (six) hours as needed for wheezing. 09/17/11 09/16/12  Duke Salvia, MD   Physical Exam: Filed Vitals:   10/27/12 2006 10/27/12 2030 10/27/12 2130 10/27/12 2333  BP: 175/72 129/109 169/69 174/67  Pulse: 94 94 93 100  Temp: 97.9 F (36.6 C)     TempSrc: Oral     Resp: 23 22 21 26   SpO2: 96% 95% 97% 91%    General:  Examined in the emergency department. Appears calm and comfortable Eyes: PERRL, normal lids, irises ENT: Mildly hard of hearing. Lips and tongue appear normal. Neck: no LAD, masses or thyromegaly Cardiovascular: RRR, no m/r/g. No LE edema. Respiratory: CTA bilaterally, no w/r/r. Normal respiratory effort. Abdomen: soft, distended, multiple scars, hernias noted, nontender. Moderate tenderness generally. Skin: no rash or induration seen Musculoskeletal: grossly normal tone BUE/BLE. Left foot appears unremarkable left toes appear unremarkable. Psychiatric: grossly normal mood and affect, speech fluent and appropriate Neurologic: grossly non-focal.  Wt Readings from Last 3 Encounters:  08/24/12 60.51 kg (133 lb 6.4 oz)  05/07/12 58.877 kg (129 lb 12.8 oz)  02/10/12 58.968 kg (130 lb)    Labs on Admission:  Basic Metabolic Panel:  Recent Labs Lab 10/27/12 2040  NA 134*  K 3.5  CL 98  CO2 23  GLUCOSE 137*  BUN 10  CREATININE 1.08  CALCIUM 9.0    Liver Function Tests:  Recent Labs Lab 10/27/12 2040  AST 13  ALT 12  ALKPHOS 133*  BILITOT 0.2*  PROT 7.0  ALBUMIN 2.5*    Recent Labs Lab 10/27/12 2040  LIPASE 21    CBC:  Recent Labs Lab 10/27/12 2040  WBC 16.4*  NEUTROABS 13.3*  HGB 9.9*  HCT 31.3*  MCV 79.0  PLT 348   CBG:  Recent Labs Lab 10/27/12 2332  GLUCAP 85     Radiological Exams on Admission: Ct Abdomen Pelvis W Contrast  10/27/2012  *RADIOLOGY REPORT*  Clinical Data:  Abdominal pain and distension  CT ABDOMEN AND PELVIS WITH CONTRAST  Technique:  Multidetector CT imaging of the abdomen and pelvis was performed following the standard protocol during bolus administration of intravenous contrast.  Contrast: 50mL OMNIPAQUE IOHEXOL 300 MG/ML  SOLN, OMNIPAQUE IOHEXOL 300 MG/ML  SOLN  Comparison: 12/28/2010  Findings: Partially imaged pacing wires along the right ventricle and coronary sinus.  Heart size within normal range.  Emphysematous changes at the lung bases.  There are linear areas of scarring within the left lung base.  Unremarkable liver.  The gallbladder not visualized.  Minimal prominence of the CBD at 8 mm is nonspecific post cholecystectomy and similar to prior.  Unremarkable spleen, pancreas, adrenal glands.  Upper pole right renal cyst.  Lobular renal contours.  Areas of scarring.  No hydronephrosis or hydroureter.  Pelvic floor laxity.  Anterior abdominal wall hernia containing the anterior wall of a segment of transverse colon.  No CT evidence for incarceration or obstruction.  There is a short segment of jejunum with thick wall, favored to be secondary to incomplete distension. However, more distal jejunal loop within the midline lower abdomen shows mild peri visceral fat stranding and fluid.  Air fluid levels just proximally, and upper normal diameter at 2.9 cm.  The more distal small bowel loops are relatively decompressed.  No free intraperitoneal air.  No lymphadenopathy.  Portocaval lymph nodes measuring up to 9 mm short axis, nonspecific.  There is scattered atherosclerotic calcification of the aorta and its branches. No aneurysmal dilatation.  Retroaortic left renal vein.  Thin-walled bladder.  Absent uterus.  No adnexal mass.  Osteopenia and multilevel degenerative changes.  Heterogeneous attenuation of the marrow is nonspecific however similar to prior.  IMPRESSION: Segment of jejunum with perivisceral fat stranding and fluid. Most in keeping with a  nonspecific enteritis (infectious, inflammatory, and ischemic considerations).  The more distal small bowel loops are decompressed.  This is favored to be due to delayed transit/ileus.  However, KUB follow-up to document passage of ingested contrast into the colon is recommended to exclude early obstruction.  Anterior abdominal wall hernia containing the anterior wall of a segment of transverse colon.  No CT evidence for incarceration or obstruction.  Heterogeneous appearance to the bones is nonspecific.  May be secondary to osteopenia or renal osteodystrophy. An infiltrative marrow process (such as myeloma or lymphoma) is not excluded however not favored given the stability.   Original Report Authenticated By: Jearld Lesch, M.D.     EKG: Independently reviewed. As above.   Principal Problem:   Small bowel obstruction Active Problems:   HYPOTHYROIDISM   CARDIOMYOPATHY, resolved   Chronic diastolic heart failure   COPD   Biventricular cardiac pacemaker Medtronic   Incisional hernia   Abdominal pain   Anemia   Diabetes mellitus type 2 in nonobese   Assessment/Plan 1. Abdominal pain: Suspected developing small bowel obstruction, differential does include enteritis although symptoms do not strongly suggest infection, suspect leukocytosis secondary to stress reaction rather than infection. Monitor off on antibiotics for now. Dark stools raise the possibility of an ischemic etiology however stool occult was negative. Check CBC in the morning. Discussed in detail with Dr. Biagio Quint. Plan  Bowel rest, NG tube, IV fluids. Could consider GI consultation in the morning. Has seen Dr. Randa Evens in the past. 2. Anterior wall hernia containing segment of colon: No evidence of incarceration or obstruction. 3. Anemia: Etiology unclear. Follow. Doubt acute blood loss. Repeat CBC. 4. History of nonischemic cardiomyopathy: Normal left ventricular ejection fraction was studied 07/2010. Grade 1 diastolic  dysfunction. 5. COPD: Stable. 6. DM: Stable. Sliding-scale insulin. Hold glyburide. 7. Recent toe infection?: No evidence of physical exam. Patient discontinued Keflex on her on at home.  Note records reviewed, multiple cardiology consultations, I see no evidence of previous MI in the record. Cardiac catheterization 2005 was unremarkable.  Orders Synthroid IV 55, Protonix IV  Code Status: Full code Family Communication: Discussed with daughter and husband at bedside Disposition Plan/Anticipated LOS: Admit, 3 days.  Time spent: 50 minutes  Brendia Sacks, MD  [facility] Pager [phone] 10/28/2012, 12:14 AM

## 2012-10-28 NOTE — Consult Note (Signed)
  Consult note dictated job [phone]  Uncertain cause of her abdominal pain and distension.  Likely adhesive SBO.  Recommend NPO, NG tube decompression, IV fluids and surgery will follow.

## 2012-10-28 NOTE — Progress Notes (Signed)
Patient ID: [name], female   DOB: 1929-09-08, 79 y.o.   MRN: [id number]    Subjective: Reports feeling better today, had flatus and BM, denies n/v, abd a little sore  Objective: Vital signs in last 24 hours: Temp:  [97.9 F (36.6 C)-98.6 F (37 C)] 98.6 F (37 C) (03/12 0357) Pulse Rate:  [61-100] 61 (03/12 0357) Resp:  [20-26] 21 (03/12 0357) BP: (129-175)/(65-109) 159/71 mmHg (03/12 0357) SpO2:  [91 %-97 %] 95 % (03/12 0357) Weight:  [125 lb (56.7 kg)] 125 lb (56.7 kg) (03/12 0357) Last BM Date: 10/27/12  Intake/Output from previous day: 03/11 0701 - 03/12 0700 In: -  Out: 25 [Emesis/NG output:25] Intake/Output this shift:    PE: Abd: relatively soft, faint bowel sounds, mildly tender HGT with 25cc General: awake, alert, NAD Heart: rrr Lungs: cta b   Lab Results:   Recent Labs  10/27/12 2040 10/28/12 0415  WBC 16.4* 17.3*  HGB 9.9* 10.6*  HCT 31.3* 34.0*  PLT 348 394   BMET  Recent Labs  10/27/12 2040 10/28/12 0415  NA 134* 134*  K 3.5 3.3*  CL 98 98  CO2 23 24  GLUCOSE 137* 116*  BUN 10 8  CREATININE 1.08 1.01  CALCIUM 9.0 8.9   PT/INR No results found for this basename: LABPROT, INR,  in the last 72 hours CMP     Component Value Date/Time   NA 134* 10/28/2012 0415   K 3.3* 10/28/2012 0415   CL 98 10/28/2012 0415   CO2 24 10/28/2012 0415   GLUCOSE 116* 10/28/2012 0415   BUN 8 10/28/2012 0415   CREATININE 1.01 10/28/2012 0415   CALCIUM 8.9 10/28/2012 0415   PROT 7.0 10/27/2012 2040   ALBUMIN 2.5* 10/27/2012 2040   AST 13 10/27/2012 2040   ALT 12 10/27/2012 2040   ALKPHOS 133* 10/27/2012 2040   BILITOT 0.2* 10/27/2012 2040   GFRNONAA 51* 10/28/2012 0415   GFRAA 59* 10/28/2012 0415   Lipase     Component Value Date/Time   LIPASE 21 10/27/2012 2040       Studies/Results: Ct Abdomen Pelvis W Contrast  10/27/2012  *RADIOLOGY REPORT*  Clinical Data: Abdominal pain and distension  CT ABDOMEN AND PELVIS WITH CONTRAST  Technique:   Multidetector CT imaging of the abdomen and pelvis was performed following the standard protocol during bolus administration of intravenous contrast.  Contrast: 50mL OMNIPAQUE IOHEXOL 300 MG/ML  SOLN, OMNIPAQUE IOHEXOL 300 MG/ML  SOLN  Comparison: 12/28/2010  Findings: Partially imaged pacing wires along the right ventricle and coronary sinus.  Heart size within normal range.  Emphysematous changes at the lung bases.  There are linear areas of scarring within the left lung base.  Unremarkable liver.  The gallbladder not visualized.  Minimal prominence of the CBD at 8 mm is nonspecific post cholecystectomy and similar to prior.  Unremarkable spleen, pancreas, adrenal glands.  Upper pole right renal cyst.  Lobular renal contours.  Areas of scarring.  No hydronephrosis or hydroureter.  Pelvic floor laxity.  Anterior abdominal wall hernia containing the anterior wall of a segment of transverse colon.  No CT evidence for incarceration or obstruction.  There is a short segment of jejunum with thick wall, favored to be secondary to incomplete distension. However, more distal jejunal loop within the midline lower abdomen shows mild peri visceral fat stranding and fluid.  Air fluid levels just proximally, and upper normal diameter at 2.9 cm.  The more distal small bowel loops are  relatively decompressed.  No free intraperitoneal air.  No lymphadenopathy.  Portocaval lymph nodes measuring up to 9 mm short axis, nonspecific.  There is scattered atherosclerotic calcification of the aorta and its branches. No aneurysmal dilatation.  Retroaortic left renal vein.  Thin-walled bladder.  Absent uterus.  No adnexal mass.  Osteopenia and multilevel degenerative changes.  Heterogeneous attenuation of the marrow is nonspecific however similar to prior.  IMPRESSION: Segment of jejunum with perivisceral fat stranding and fluid. Most in keeping with a nonspecific enteritis (infectious, inflammatory, and ischemic considerations).  The  more distal small bowel loops are decompressed.  This is favored to be due to delayed transit/ileus.  However, KUB follow-up to document passage of ingested contrast into the colon is recommended to exclude early obstruction.  Anterior abdominal wall hernia containing the anterior wall of a segment of transverse colon.  No CT evidence for incarceration or obstruction.  Heterogeneous appearance to the bones is nonspecific.  May be secondary to osteopenia or renal osteodystrophy. An infiltrative marrow process (such as myeloma or lymphoma) is not excluded however not favored given the stability.   Original Report Authenticated By: Jearld Lesch, M.D.     Anti-infectives: Anti-infectives   None       Assessment/Plan 1. Abd pain/SBO: seems to being doing better clinically today with some bowel function, still tender in some areas, awaiting AM abd films, will clamp ngt but will not remove it yet today  --abd films  --NPO  --clamp NGT  --OOB as tolerated   LOS: 1 day    WHITE, [name] 10/28/2012

## 2012-10-28 NOTE — Care Management (Signed)
UR complete 

## 2012-10-28 NOTE — Consult Note (Signed)
[name], [name] NO.:  [id number]  MEDICAL RECORD NO.:  [id number]  LOCATION:  1321                         FACILITY:  [facility]  PHYSICIAN:  Lodema Pilot, MD       DATE OF BIRTH:  26-Jan-1930  DATE OF CONSULTATION:  10/28/2012 DATE OF DISCHARGE:                                CONSULTATION   REASON FOR CONSULTATION:  Abdominal pain and possible bowel obstruction.  HISTORY OF PRESENT ILLNESS:  [name] is an 79 year old female, who was in her usual state of health until about 1 and a half weeks ago.  At that time, she began having some abdominal pain, which was migratory around her abdomen and began gradually increasing in severity over the next week and a half until she finally presented to the emergency room today.  Associated with her abdominal pain was increasing nausea and vomiting, even to the point where she could not even keep liquids down anymore.  Today, she tried drinking some [place] and she said it hit her stomach and came right back up as nonbilious vomiting, just throwing up whatever was taken in.  She says that she has had watery diarrhea as well, which has been nonbloody.  She has a history of extensive abdominal surgeries including diverticulitis with Gertie Gowda procedure, performed in 2011 followed by colostomy takedown in 2011.  She also has a history of cholecystectomy, appendectomy, and hysterectomy.  Also noted at the time of her surgery were extensive adhesions.  She says that she has not measured any fevers, although she did feel like she had some fevers off and on throughout the week.  She says that she may have had some occasional black stools as well, although her recent diarrhea was not black.  She has been passing gas, and as I stated, moving her bowels, although they have been loose.  Currently, her pain is located across her upper abdomen bilaterally.  She also has a history of ventral hernia, the result of her prior abdominal  surgeries and this not changed.  She says that she does have some occasional tenderness in this area, although her tenderness is not located at this spot currently. She has several other comorbidities for which she is followed by Dr. Graciela Husbands in Cardiology, and has had a pacemaker placed and currently is being evaluated by [facility] for this admission.  ALLERGIES:  ACE inhibitors, acetaminophen and codeine, atorvastatin, bisoprolol, fluvastatin, simvastatin, gabapentin, gemfibrozil, lisinopril, oxycodone, and rosuvastatin.  PAST MEDICAL HISTORY:  Hypertension, COPD, CHF, hypothyroidism, pulmonary hypertension, atrial fibrillation, pacemaker, incisional hernia, coronary artery disease, myocardial infarction, and diverticulitis.  PAST SURGICAL HISTORY:  Defibrillator placement, appendectomy, hysterectomy, neck surgery, sigmoid colectomy , and colostomy procedure 2011, incomplete colonoscopy in 2009.  She had laparoscopic cholecystectomy and umbilical hernia repair in 2001, and colostomy takedown in 2011.  FAMILY HISTORY:  Mother had heart disease and breast cancer and brain cancer.  Both her mother and father are deceased.  She denies any alcohol use and current smoking, although she did smoke 20 years ago, fairly heavily smoking 2 packs a day at that time.  REVIEW OF SYSTEMS:  Otherwise, noncontributory except as in the history of present illness.  PHYSICAL EXAMINATION:  GENERAL:  She is in no acute distress.  Resting comfortably on the bed. VITAL SIGNS:  Her temperature is 97.9, heart rate is 94, blood pressure is 129/109, 22 respirations, and sats are 95% on room air. HEENT:  Head is normocephalic, atraumatic.  Sclerae are white.  Pupils are round and reactive to light equally bilaterally. NECK:  Her trachea is midline.  She has no JVD. CHEST:  Nontender, and she has a visible pacemaker in the left upper chest. LUNGS:  Clear to auscultation bilaterally without  wheeze. HEART:  Sounds are normal with regular rhythm and normal rate.  No evidence of atrial fibrillation today. ABDOMEN:  Soft, it is mildly distended with a reducible midline ventral hernia.  She has some upper abdominal tenderness to palpation, but no peritoneal signs.  No inguinal hernias.  EXTREMITIES:  Tender left ankle without obvious deformity. SKIN:  Normal.  Her pulses are palpable in all 4 distal extremities.  LABORATORY DATA:  White count of 16.4, hemoglobin 9.9, hematocrit 31.3, and platelets of 348, with 81% neutrophils.  Sodium is 134, potassium 3.5, chloride 98, CO2 of 23, BUN is 10, creatinine is 1.08, glucose is 137.  AST 13, ALT is 12, alkaline phosphatase is 133, and bilirubin is 0.2.  Lipase is 21.  UA is negative.  IMAGING:  She had a CT scan of the abdomen which demonstrated just very mildly dilated loops of small bowel without any obvious obstruction. The contrast filled small bowel and stomach but does not go completely into the large intestine.  The report says that there is a segment of jejunum with some stranding and fluids around this, possibly infectious, inflammatory, or ischemic enteritis, bowel obstruction cannot be completely ruled out.  Her hernia is visualized on CT scan, and consistent with her physical exam does not appear to be the source of obstruction.  ASSESSMENT AND PLAN:  Abdominal pain and distention, possible small bowel obstruction, versus other cause.  I cannot completely rule out small bowel obstruction, although she does not have any obvious peritonitis or any apparent emergent need for surgery.  I have discussed her case with Dr. Irene Limbo, [facility] and we will plan for admission to the hospital with bowel rest and n.p.o. status.  I would recommend NG tube for gastric decompression and IV fluids for hydration.  We will follow her with serial exams and abdominal x-rays and repeat her labs.  I would think that if this  was due to ischemic bowel, then she would have been much more ill since her symptoms have been going on for a week and a half now.  I have reviewed her prior operative reports and with her most recent abdominal surgery. She has been noted to have significant amount of intra-abdominal adhesions and there is a good chance that this is an adhesive small bowel obstruction.  If this is the case, then this will possibly resolve with bowel rest and NG tube decompression.  Surgery will be happy to follow along while she is in the hospital in case she does not improve or she shows evidence of worsening abdominal exam or other clinical findings.          ______________________________ Lodema Pilot, MD     BL/MEDQ  D:  10/28/2012  T:  10/28/2012  Job:  [id number]

## 2012-10-28 NOTE — Progress Notes (Signed)
[facility] PROGRESS NOTE  [name] ZOX:[id number] DOB: 1929/10/03 DOA: 10/27/2012 PCP: Nadean Corwin, MD  Brief narrative: [name] is an 79 y.o. female with a past medical history of hypothyroidism, chronic diastolic heart failure, type 2 diabetes and an incisional hernia who was admitted 10/28/2012 with possible small bowel obstruction versus enteritis.  Assessment/Plan: Principal Problem:   Small bowel obstruction with abdominal pain -Admitted and surgical consultation requested. -Continue conservative treatment with NG tube gastric decompression, IV fluids, and antinausea medications. -Repeat KUB this morning. Symptoms appear to be resolving. Active Problems:   Hypokalemia -Add potassium to IV fluids.   HYPOTHYROIDISM -Continue IV Synthroid.  Check TSH to ensure that she is appropriately replaced.   CARDIOMYOPATHY, resolved / chronic diastolic heart failure / status post pacemaker -Normal left ventricular ejection fraction and grade 1 diastolic dysfunction noted on echocardiography 07/2010.   COPD -Stable.Continue Spiriva.   Incisional hernia -No evidence of incarceration. Continue to monitor.   Anemia -Normocytic. Likely anemia of chronic disease. Check an anemia panel. -Fecal occult blood testing was negative.   Diabetes mellitus type 2 in nonobese -Continue sliding scale insulin. Glyburide on hold. -CBG is 85-104.   Code Status: Full. Family Communication: None at bedside. Disposition Plan: Home when stable.   Medical Consultants:  Dr. Lodema Pilot, Surgery  Other Consultants:  None.  Anti-infectives:  None.  HPI/Subjective: [name] is feeling much better this morning. She reports that she had a bowel movement and that she is passing flatus. Her abdominal pain is much better though she still has some tenderness in the epigastric area. No dyspnea. She does have a moist cough.  Objective: Filed Vitals:   10/28/12 0000 10/28/12 0212 10/28/12 0300 10/28/12 0357  BP: 166/68 159/102 161/65 159/71  Pulse: 92 94 93 61  Temp:    98.6 F (37 C)  TempSrc:    Oral  Resp: 22 20 23 21   Height:    5\' 4"  (1.626 m)  Weight:    56.7 kg (125 lb)  SpO2: 94% 96% 95% 95%    Intake/Output Summary (Last 24 hours) at 10/28/12 0841 Last data filed at 10/28/12 0700  Gross per 24 hour  Intake      0 ml  Output     25 ml  Net    -25 ml    Exam: Gen:  NAD Cardiovascular:  RRR, No M/R/G Respiratory:  Lungs CTAB Gastrointestinal:  Abdomen soft, mildly tender to mid abdomen, + BS Extremities:  No C/E/C  Data Reviewed: Basic Metabolic Panel:  Recent Labs Lab 10/27/12 2040 10/28/12 0415  NA 134* 134*  K 3.5 3.3*  CL 98 98  CO2 23 24  GLUCOSE 137* 116*  BUN 10 8  CREATININE 1.08 1.01  CALCIUM 9.0 8.9   GFR Estimated Creatinine Clearance: 37.7 ml/min (by C-G formula based on Cr of 1.01). Liver Function Tests:  Recent Labs Lab 10/27/12 2040  AST 13  ALT 12  ALKPHOS 133*  BILITOT 0.2*  PROT 7.0  ALBUMIN 2.5*    Recent Labs Lab 10/27/12 2040  LIPASE 21   CBC:  Recent Labs Lab 10/27/12 2040 10/28/12 0415  WBC 16.4* 17.3*  NEUTROABS 13.3*  --   HGB 9.9* 10.6*  HCT 31.3* 34.0*  MCV 79.0 80.6  PLT 348 394   CBG:  Recent Labs Lab 10/27/12 2332 10/28/12 0210  GLUCAP 85 104*   Anemia work up No results found for this basename: VITAMINB12, FOLATE, FERRITIN, TIBC, IRON,  RETICCTPCT,  in the last 72 hours   Procedures and Diagnostic Studies:  Ct Abdomen Pelvis W Contrast 10/27/2012   IMPRESSION: Segment of jejunum with perivisceral fat stranding and fluid. Most in keeping with a nonspecific enteritis (infectious, inflammatory, and ischemic considerations).  The more distal small bowel loops are decompressed.  This is favored to be due to delayed transit/ileus.  However, KUB follow-up to document passage of ingested contrast into the colon is recommended to exclude early  obstruction.  Anterior abdominal wall hernia containing the anterior wall of a segment of transverse colon.  No CT evidence for incarceration or obstruction.  Heterogeneous appearance to the bones is nonspecific.  May be secondary to osteopenia or renal osteodystrophy. An infiltrative marrow process (such as myeloma or lymphoma) is not excluded however not favored given the stability.   Original Report Authenticated By: Jearld Lesch, M.D.     Scheduled Meds: . Levothyroxine Sodium  52 mcg Intravenous QAC breakfast  . pantoprazole (PROTONIX) IV  40 mg Intravenous Q24H  . tiotropium  18 mcg Inhalation Daily   Continuous Infusions: . sodium chloride 75 mL/hr at 10/28/12 0651    Time spent: 30 minutes.   LOS: 1 day   RAMA,CHRISTINA  [facility] Pager [phone].  If 8PM-8AM, please contact night-coverage at [email or web address], password [email or web address] 10/28/2012, 8:41 AM

## 2012-10-29 ENCOUNTER — Inpatient Hospital Stay (HOSPITAL_COMMUNITY): Payer: Medicare Other

## 2012-10-29 LAB — BASIC METABOLIC PANEL
BUN: 12 mg/dL (ref 6–23)
Calcium: 8.5 mg/dL (ref 8.4–10.5)
Creatinine, Ser: 0.94 mg/dL (ref 0.50–1.10)
GFR calc Af Amer: 64 mL/min — ABNORMAL LOW (ref >90)
GFR calc non Af Amer: 55 mL/min — ABNORMAL LOW (ref >90)
Glucose, Bld: 105 mg/dL — ABNORMAL HIGH (ref 70–99)

## 2012-10-29 LAB — CBC
MCH: 24.8 pg — ABNORMAL LOW (ref 26.0–34.0)
MCHC: 31 g/dL (ref 30.0–36.0)
RDW: 15.4 % (ref 11.5–15.5)

## 2012-10-29 LAB — LACTIC ACID, PLASMA: Lactic Acid, Venous: 0.6 mmol/L (ref 0.5–2.2)

## 2012-10-29 MED ORDER — BIOTENE DRY MOUTH MT LIQD
15.0000 mL | Freq: Two times a day (BID) | OROMUCOSAL | Status: DC
  Administered 2012-10-29 – 2012-11-06 (×17): 15 mL via OROMUCOSAL

## 2012-10-29 MED ORDER — CYANOCOBALAMIN 1000 MCG/ML IJ SOLN
1000.0000 ug | Freq: Every day | INTRAMUSCULAR | Status: AC
  Administered 2012-10-29 – 2012-11-04 (×7): 1000 ug via INTRAMUSCULAR
  Filled 2012-10-29 (×7): qty 1

## 2012-10-29 MED ORDER — CHLORHEXIDINE GLUCONATE 0.12 % MT SOLN
15.0000 mL | Freq: Two times a day (BID) | OROMUCOSAL | Status: DC
  Administered 2012-10-29 – 2012-11-06 (×15): 15 mL via OROMUCOSAL
  Filled 2012-10-29 (×19): qty 15

## 2012-10-29 NOTE — Progress Notes (Signed)
Patient ID: [name], female   DOB: 1930/05/19, 79 y.o.   MRN: [id number]    Subjective: Reports feeling worse today, still having flatus and BM, +nausea last night, abd more tender,   Objective: Vital signs in last 24 hours: Temp:  [97.7 F (36.5 C)-98.4 F (36.9 C)] 97.7 F (36.5 C) (03/13 0445) Pulse Rate:  [90-95] 94 (03/13 0445) Resp:  [18] 18 (03/13 0445) BP: (148-167)/(64-70) 155/64 mmHg (03/13 0445) SpO2:  [94 %-98 %] 96 % (03/13 0758) Last BM Date: 10/28/12  Intake/Output from previous day:   Intake/Output this shift:    PE: Abd: more distended, faint bowel sounds, more tender NGT with min (??positioned right) General: awake, alert, NAD Heart: rrr Lungs: cta b   Lab Results:   Recent Labs  10/28/12 0415 10/29/12 0415  WBC 17.3* 16.5*  HGB 10.6* 10.9*  HCT 34.0* 35.2*  PLT 394 345   BMET  Recent Labs  10/28/12 0415 10/29/12 0415  NA 134* 137  K 3.3* 4.3  CL 98 103  CO2 24 18*  GLUCOSE 116* 105*  BUN 8 12  CREATININE 1.01 0.94  CALCIUM 8.9 8.5   PT/INR No results found for this basename: LABPROT, INR,  in the last 72 hours CMP     Component Value Date/Time   NA 137 10/29/2012 0415   K 4.3 10/29/2012 0415   CL 103 10/29/2012 0415   CO2 18* 10/29/2012 0415   GLUCOSE 105* 10/29/2012 0415   BUN 12 10/29/2012 0415   CREATININE 0.94 10/29/2012 0415   CALCIUM 8.5 10/29/2012 0415   PROT 7.0 10/27/2012 2040   ALBUMIN 2.5* 10/27/2012 2040   AST 13 10/27/2012 2040   ALT 12 10/27/2012 2040   ALKPHOS 133* 10/27/2012 2040   BILITOT 0.2* 10/27/2012 2040   GFRNONAA 55* 10/29/2012 0415   GFRAA 64* 10/29/2012 0415   Lipase     Component Value Date/Time   LIPASE 21 10/27/2012 2040       Studies/Results: Dg Abd 1 View  10/28/2012  *RADIOLOGY REPORT*  Clinical Data: Follow up small bowel obstruction  ABDOMEN - 1 VIEW  Comparison: 10/27/12  Findings: Cholecystectomy clips are noted in the right upper quadrant.  Nasogastric tube tip is in the stomach.   The stomach is moderately distended with air.  There has been progression of the enteric contrast material into the colon.  No dilated loops of small bowel identified.  IMPRESSION:  1.  Progression of enteric contrast material into the colon. 2.  Gaseous distention of the stomach.   Original Report Authenticated By: Signa Kell, M.D.    Ct Abdomen Pelvis W Contrast  10/27/2012  *RADIOLOGY REPORT*  Clinical Data: Abdominal pain and distension  CT ABDOMEN AND PELVIS WITH CONTRAST  Technique:  Multidetector CT imaging of the abdomen and pelvis was performed following the standard protocol during bolus administration of intravenous contrast.  Contrast: 50mL OMNIPAQUE IOHEXOL 300 MG/ML  SOLN, OMNIPAQUE IOHEXOL 300 MG/ML  SOLN  Comparison: 12/28/2010  Findings: Partially imaged pacing wires along the right ventricle and coronary sinus.  Heart size within normal range.  Emphysematous changes at the lung bases.  There are linear areas of scarring within the left lung base.  Unremarkable liver.  The gallbladder not visualized.  Minimal prominence of the CBD at 8 mm is nonspecific post cholecystectomy and similar to prior.  Unremarkable spleen, pancreas, adrenal glands.  Upper pole right renal cyst.  Lobular renal contours.  Areas of scarring.  No  hydronephrosis or hydroureter.  Pelvic floor laxity.  Anterior abdominal wall hernia containing the anterior wall of a segment of transverse colon.  No CT evidence for incarceration or obstruction.  There is a short segment of jejunum with thick wall, favored to be secondary to incomplete distension. However, more distal jejunal loop within the midline lower abdomen shows mild peri visceral fat stranding and fluid.  Air fluid levels just proximally, and upper normal diameter at 2.9 cm.  The more distal small bowel loops are relatively decompressed.  No free intraperitoneal air.  No lymphadenopathy.  Portocaval lymph nodes measuring up to 9 mm short axis, nonspecific.   There is scattered atherosclerotic calcification of the aorta and its branches. No aneurysmal dilatation.  Retroaortic left renal vein.  Thin-walled bladder.  Absent uterus.  No adnexal mass.  Osteopenia and multilevel degenerative changes.  Heterogeneous attenuation of the marrow is nonspecific however similar to prior.  IMPRESSION: Segment of jejunum with perivisceral fat stranding and fluid. Most in keeping with a nonspecific enteritis (infectious, inflammatory, and ischemic considerations).  The more distal small bowel loops are decompressed.  This is favored to be due to delayed transit/ileus.  However, KUB follow-up to document passage of ingested contrast into the colon is recommended to exclude early obstruction.  Anterior abdominal wall hernia containing the anterior wall of a segment of transverse colon.  No CT evidence for incarceration or obstruction.  Heterogeneous appearance to the bones is nonspecific.  May be secondary to osteopenia or renal osteodystrophy. An infiltrative marrow process (such as myeloma or lymphoma) is not excluded however not favored given the stability.   Original Report Authenticated By: Jearld Lesch, M.D.     Anti-infectives: Anti-infectives   None       Assessment/Plan 1. Abd pain/SBO: NGT back on suction due to nausea, seems more distended today, awaiting AM abd films, if continues to worsen tomorrow may need surgery  --abd films  --NPO  --OOB as tolerated   LOS: 2 days    [name], Amy 10/29/2012

## 2012-10-29 NOTE — Progress Notes (Signed)
[facility] PROGRESS NOTE  GERMANI GAVILANES ZOX:[id number] DOB: 09/13/1929 DOA: 10/27/2012 PCP: Nadean Corwin, MD  Brief narrative: [name] is an 79 y.o. female with a past medical history of hypothyroidism, chronic diastolic heart failure, type 2 diabetes and an incisional hernia who was admitted 10/28/2012 with possible small bowel obstruction versus enteritis.  Assessment/Plan: Principal Problem:   Small bowel obstruction with abdominal pain / rule out gastric outlet obstruction -Admitted and surgical consultation requested. -Continue conservative treatment with NG tube gastric decompression, IV fluids, and antinausea medications. -Repeat KUB 10/28/2012 showed contrast that had migrated into the colon but ongoing gastric distention and therefore we have hooked her back to NG suction.  -Due to significant gastric distention, requested gastroenterology evaluation for consideration of upper endoscopy to rule out gastric outlet obstruction. Active Problems:   Metabolic acidosis -Bicarbonate level down today. Lactic acid checked and found to be within normal limits.   Hypokalemia -Corrected with potassium added to IV fluids.    HYPOTHYROIDISM -Continue IV Synthroid. TSH 0.582, appropriately replaced.   CARDIOMYOPATHY, resolved / chronic diastolic heart failure / status post pacemaker -Normal left ventricular ejection fraction and grade 1 diastolic dysfunction noted on echocardiography 07/2010.   COPD -Stable.Continue Spiriva.   Incisional hernia -No evidence of incarceration. Continue to monitor.   Anemia, multifactorial, secondary to B12 deficiency and anemia of chronic disease -Normocytic. Likely secondary to B12 deficiency. B12 levels 185. Folic acid levels greater than 20. Normal ferritin.  Iron low at less than 10. Urine iron binding capacity 219. This is consistent with anemia of chronic disease. We'll start on B12 supplementation to address the B12  deficiency. -Fecal occult blood testing was negative.   Diabetes mellitus type 2 in nonobese -Continue sliding scale insulin. Glyburide on hold. -CBG is 85-104.   Code Status: Full. Family Communication: Daughter updated at bedside. Disposition Plan: Home when stable.   Medical Consultants:  Dr. Lodema Pilot, Surgery   Dr. Carman Ching, Gastroenterology  Other Consultants:  None.  Anti-infectives:  None.  HPI/Subjective: [name] has worse nausea and abdominal distention this morning. Her NG tube was clamped yesterday by the surgeons, and it remained clamped overnight. Still having flatus and bowel movements.  Objective: Filed Vitals:   10/28/12 1133 10/28/12 1440 10/28/12 2249 10/29/12 0445  BP:  148/68 167/70 155/64  Pulse:  90 95 94  Temp:  98.4 F (36.9 C) 98.1 F (36.7 C) 97.7 F (36.5 C)  TempSrc:  Oral Oral Oral  Resp:  18 18 18   Height:      Weight:      SpO2: 94% 98% 94% 95%   No intake or output data in the 24 hours ending 10/29/12 0758  Exam: Gen:  NAD Cardiovascular:  RRR, No M/R/G Respiratory:  Lungs CTAB Gastrointestinal:  Abdomen more distended with increased tenderness to the mid epigastrium, + BS Extremities:  No C/E/C  Data Reviewed: Basic Metabolic Panel:  Recent Labs Lab 10/27/12 2040 10/28/12 0415 10/29/12 0415  NA 134* 134* 137  K 3.5 3.3* 4.3  CL 98 98 103  CO2 23 24 18*  GLUCOSE 137* 116* 105*  BUN 10 8 12   CREATININE 1.08 1.01 0.94  CALCIUM 9.0 8.9 8.5   GFR Estimated Creatinine Clearance: 40.5 ml/min (by C-G formula based on Cr of 0.94). Liver Function Tests:  Recent Labs Lab 10/27/12 2040  AST 13  ALT 12  ALKPHOS 133*  BILITOT 0.2*  PROT 7.0  ALBUMIN 2.5*    Recent  Labs Lab 10/27/12 2040  LIPASE 21   CBC:  Recent Labs Lab 10/27/12 2040 10/28/12 0415 10/29/12 0415  WBC 16.4* 17.3* 16.5*  NEUTROABS 13.3*  --   --   HGB 9.9* 10.6* 10.9*  HCT 31.3* 34.0* 35.2*  MCV 79.0 80.6 80.0   PLT 348 394 345   CBG:  Recent Labs Lab 10/27/12 2332 10/28/12 0210  GLUCAP 85 104*   Anemia work up  Recent Labs  10/28/12 0930  VITAMINB12 185*  FOLATE >20.0  FERRITIN 122  TIBC Not calculated due to Iron <10.  IRON <10*  RETICCTPCT 0.9     Procedures and Diagnostic Studies:  Ct Abdomen Pelvis W Contrast 10/27/2012   IMPRESSION: Segment of jejunum with perivisceral fat stranding and fluid. Most in keeping with a nonspecific enteritis (infectious, inflammatory, and ischemic considerations).  The more distal small bowel loops are decompressed.  This is favored to be due to delayed transit/ileus.  However, KUB follow-up to document passage of ingested contrast into the colon is recommended to exclude early obstruction.  Anterior abdominal wall hernia containing the anterior wall of a segment of transverse colon.  No CT evidence for incarceration or obstruction.  Heterogeneous appearance to the bones is nonspecific.  May be secondary to osteopenia or renal osteodystrophy. An infiltrative marrow process (such as myeloma or lymphoma) is not excluded however not favored given the stability.   Original Report Authenticated By: Jearld Lesch, M.D.     Dg Abd 1 View 10/28/2012 IMPRESSION:  1.  Progression of enteric contrast material into the colon. 2.  Gaseous distention of the stomach.   Original Report Authenticated By: Signa Kell, M.D.       Scheduled Meds: . cyanocobalamin  1,000 mcg Intramuscular Daily  . Levothyroxine Sodium  52 mcg Intravenous QAC breakfast  . pantoprazole (PROTONIX) IV  40 mg Intravenous Q24H  . tiotropium  18 mcg Inhalation Daily   Continuous Infusions: . 0.9 % NaCl with KCl 40 mEq / L 75 mL/hr at 10/28/12 2238    Time spent: 35 minutes.   LOS: 2 days   RAMA,CHRISTINA  [facility] Pager [phone].  If 8PM-8AM, please contact night-coverage at [email or web address], password [facility] 10/29/2012, 7:58 AM

## 2012-10-29 NOTE — Consult Note (Signed)
[facility] CONSULT Reason for consult: nausea and vomiting and abdominal pain Referring Physician: [facility]. PCP: Dr.McKeown. Primary G.I.: Dr. Edwards  [name] is an 79 y.o. female.  HPI: I have been following her for some time because of the history of prior colon polyps as well as chronic G.I. symptoms. She has had multiple prior surgeries including appendectomy, cholecystectomy, bladder suspension and other bladder surgeries, sigmoid resection due to perforated diverticulitis with temporary colostomy, umbilical hernia repair. In the past she had had unsuccessful colonoscopies due to a mobile:. She has a history of colon polyp. She has had chronic symptoms of nausea and vomiting which    has been assumed to be due to medications, adhesions etc. She often has felt full and in the past we have had her on multiple small meals. 6/12 she had follow-up colonoscopy which was difficult but we were able to reach the cecum and no polyps were saying. Because of nausea and vomiting, bloating, etc. she had EGD at that time it was felt to be normal. She has done reasonably well for the past year and a half. She's chronically constipated, has chronic bloating, and chronic nausea. She was admitted to the hospital because of one week of epigastric pain and nausea and vomiting with CT scan on admission showing possible partial small bowel obstruction versus small bowel enteritis. She is feeling somewhat better withinN G-tube suction. Repeat KUB shows dilated stomach with question of gastric outlet obstruction. Patient adamantly denies the use of any NSAIDs. She is continue to have bowel movements and pass flatus throughout this time. She has multiple other problems that have included cardiac arrhythmias. She has a pacemaker/AICD. According to the patient Dr. Klein disable the AICD for unclear reasons and only her pacemaker is now functional. She said prior heart attacks, hypothyroidism,  diabetes, asthma and COPD. Her multiple surgeries are noted above.  Past Medical History  Diagnosis Date  . CONGESTIVE HEART FAILURE 06/03/2007  . COPD 06/03/2007  . HYPERTENSION 06/03/2007  . HYPOTHYROIDISM 06/03/2007  . Hernia   . Asthma   . Diabetes mellitus   . Hyperlipidemia   . Arthritis   . Myocardial infarction   . Osteoporosis     Past Surgical History  Procedure Laterality Date  . Cardiac defibrillator placement    . Appendectomy    . Hysterectomy - unknown type    . Posterior fusion clivus-c2 transarticular stealth guided w/ icbg    . Cystectomy      elbow  . Icd      medtronic maximo  . Polypectomy      bladder    Family History  Problem Relation Age of Onset  . Heart disease Mother   . Breast cancer Mother   . Brain cancer Mother     Social History:  reports that she quit smoking about 16 years ago. She has never used smokeless tobacco. She reports that she does not drink alcohol or use illicit drugs.  Allergies:  Allergies  Allergen Reactions  . Ace Inhibitors   . Acetaminophen-Codeine     REACTION: n/v  . Atorvastatin   . Bisoprolol Fumarate   . Ezetimibe-Simvastatin   . Fluvastatin Sodium   . Gabapentin   . Gemfibrozil   . Lisinopril     REACTION: unspecified  . Oxycodone Hcl     REACTION: vomiting  . Rosuvastatin     Medications; . antiseptic oral rinse  15 mL Mouth Rinse q12n4p  . chlorhexidine  15   mL Mouth Rinse BID  . cyanocobalamin  1,000 mcg Intramuscular Daily  . Levothyroxine Sodium  52 mcg Intravenous QAC breakfast  . pantoprazole (PROTONIX) IV  40 mg Intravenous Q24H  . tiotropium  18 mcg Inhalation Daily   PRN Meds acetaminophen, acetaminophen, hydrALAZINE, morphine injection, ondansetron (ZOFRAN) IV, ondansetron Results for orders placed during the hospital encounter of 10/27/12 (from the past 48 hour(s))  CBC WITH DIFFERENTIAL     Status: Abnormal   Collection Time    10/27/12  8:40 PM      Result Value Range   WBC  16.4 (*) 4.0 - 10.5 K/uL   RBC 3.96  3.87 - 5.11 MIL/uL   Hemoglobin 9.9 (*) 12.0 - 15.0 g/dL   HCT 31.3 (*) 36.0 - 46.0 %   MCV 79.0  78.0 - 100.0 fL   MCH 25.0 (*) 26.0 - 34.0 pg   MCHC 31.6  30.0 - 36.0 g/dL   RDW 15.3  11.5 - 15.5 %   Platelets 348  150 - 400 K/uL   Neutrophils Relative 81 (*) 43 - 77 %   Neutro Abs 13.3 (*) 1.7 - 7.7 K/uL   Lymphocytes Relative 9 (*) 12 - 46 %   Lymphs Abs 1.5  0.7 - 4.0 K/uL   Monocytes Relative 9  3 - 12 %   Monocytes Absolute 1.5 (*) 0.1 - 1.0 K/uL   Eosinophils Relative 1  0 - 5 %   Eosinophils Absolute 0.1  0.0 - 0.7 K/uL   Basophils Relative 0  0 - 1 %   Basophils Absolute 0.0  0.0 - 0.1 K/uL  COMPREHENSIVE METABOLIC PANEL     Status: Abnormal   Collection Time    10/27/12  8:40 PM      Result Value Range   Sodium 134 (*) 135 - 145 mEq/L   Potassium 3.5  3.5 - 5.1 mEq/L   Chloride 98  96 - 112 mEq/L   CO2 23  19 - 32 mEq/L   Glucose, Bld 137 (*) 70 - 99 mg/dL   BUN 10  6 - 23 mg/dL   Creatinine, Ser 1.08  0.50 - 1.10 mg/dL   Calcium 9.0  8.4 - 10.5 mg/dL   Total Protein 7.0  6.0 - 8.3 g/dL   Albumin 2.5 (*) 3.5 - 5.2 g/dL   AST 13  0 - 37 U/L   ALT 12  0 - 35 U/L   Alkaline Phosphatase 133 (*) 39 - 117 U/L   Total Bilirubin 0.2 (*) 0.3 - 1.2 mg/dL   GFR calc non Af Amer 47 (*) >90 mL/min   GFR calc Af Amer 54 (*) >90 mL/min   Comment:            The eGFR has been calculated     using the CKD EPI equation.     This calculation has not been     validated in all clinical     situations.     eGFR's persistently     <90 mL/min signify     possible Chronic Kidney Disease.  LIPASE, BLOOD     Status: None   Collection Time    10/27/12  8:40 PM      Result Value Range   Lipase 21  11 - 59 U/L  URINALYSIS, ROUTINE W REFLEX MICROSCOPIC     Status: None   Collection Time    10/27/12 10:21 PM      Result Value Range     Color, Urine YELLOW  YELLOW   APPearance CLEAR  CLEAR   Specific Gravity, Urine 1.016  1.005 - 1.030   pH 5.5   5.0 - 8.0   Glucose, UA NEGATIVE  NEGATIVE mg/dL   Hgb urine dipstick NEGATIVE  NEGATIVE   Bilirubin Urine NEGATIVE  NEGATIVE   Ketones, ur NEGATIVE  NEGATIVE mg/dL   Protein, ur NEGATIVE  NEGATIVE mg/dL   Urobilinogen, UA 0.2  0.0 - 1.0 mg/dL   Nitrite NEGATIVE  NEGATIVE   Leukocytes, UA NEGATIVE  NEGATIVE   Comment: MICROSCOPIC NOT DONE ON URINES WITH NEGATIVE PROTEIN, BLOOD, LEUKOCYTES, NITRITE, OR GLUCOSE <1000 mg/dL.  GLUCOSE, CAPILLARY     Status: None   Collection Time    10/27/12 11:32 PM      Result Value Range   Glucose-Capillary 85  70 - 99 mg/dL   Comment 1 Notify RN     Comment 2 Documented in Chart    OCCULT BLOOD X 1 CARD TO LAB, STOOL     Status: None   Collection Time    10/28/12 12:16 AM      Result Value Range   Fecal Occult Bld NEGATIVE  NEGATIVE  GLUCOSE, CAPILLARY     Status: Abnormal   Collection Time    10/28/12  2:10 AM      Result Value Range   Glucose-Capillary 104 (*) 70 - 99 mg/dL  BASIC METABOLIC PANEL     Status: Abnormal   Collection Time    10/28/12  4:15 AM      Result Value Range   Sodium 134 (*) 135 - 145 mEq/L   Potassium 3.3 (*) 3.5 - 5.1 mEq/L   Chloride 98  96 - 112 mEq/L   CO2 24  19 - 32 mEq/L   Glucose, Bld 116 (*) 70 - 99 mg/dL   BUN 8  6 - 23 mg/dL   Creatinine, Ser 1.01  0.50 - 1.10 mg/dL   Calcium 8.9  8.4 - 10.5 mg/dL   GFR calc non Af Amer 51 (*) >90 mL/min   GFR calc Af Amer 59 (*) >90 mL/min   Comment:            The eGFR has been calculated     using the CKD EPI equation.     This calculation has not been     validated in all clinical     situations.     eGFR's persistently     <90 mL/min signify     possible Chronic Kidney Disease.  CBC     Status: Abnormal   Collection Time    10/28/12  4:15 AM      Result Value Range   WBC 17.3 (*) 4.0 - 10.5 K/uL   RBC 4.22  3.87 - 5.11 MIL/uL   Hemoglobin 10.6 (*) 12.0 - 15.0 g/dL   HCT 34.0 (*) 36.0 - 46.0 %   MCV 80.6  78.0 - 100.0 fL   MCH 25.1 (*) 26.0 - 34.0 pg    MCHC 31.2  30.0 - 36.0 g/dL   RDW 15.4  11.5 - 15.5 %   Platelets 394  150 - 400 K/uL  TSH     Status: None   Collection Time    10/28/12  9:30 AM      Result Value Range   TSH 0.582  0.350 - 4.500 uIU/mL  VITAMIN B12     Status: Abnormal   Collection Time    10/28/12  9:30   AM      Result Value Range   Vitamin B-12 185 (*) 211 - 911 pg/mL  FOLATE     Status: None   Collection Time    10/28/12  9:30 AM      Result Value Range   Folate >20.0     Comment: (NOTE)     Reference Ranges            Deficient:       0.4 - 3.3 ng/mL            Indeterminate:   3.4 - 5.4 ng/mL            Normal:              > 5.4 ng/mL  IRON AND TIBC     Status: Abnormal   Collection Time    10/28/12  9:30 AM      Result Value Range   Iron <10 (*) 42 - 135 ug/dL   TIBC Not calculated due to Iron <10.  250 - 470 ug/dL   Saturation Ratios Not calculated due to Iron <10.  20 - 55 %   UIBC 219  125 - 400 ug/dL  FERRITIN     Status: None   Collection Time    10/28/12  9:30 AM      Result Value Range   Ferritin 122  10 - 291 ng/mL  RETICULOCYTES     Status: None   Collection Time    10/28/12  9:30 AM      Result Value Range   Retic Ct Pct 0.9  0.4 - 3.1 %   RBC. 4.18  3.87 - 5.11 MIL/uL   Retic Count, Manual 37.6  19.0 - 186.0 K/uL  BASIC METABOLIC PANEL     Status: Abnormal   Collection Time    10/29/12  4:15 AM      Result Value Range   Sodium 137  135 - 145 mEq/L   Potassium 4.3  3.5 - 5.1 mEq/L   Comment: DELTA CHECK NOTED     REPEATED TO VERIFY     NO VISIBLE HEMOLYSIS   Chloride 103  96 - 112 mEq/L   CO2 18 (*) 19 - 32 mEq/L   Glucose, Bld 105 (*) 70 - 99 mg/dL   BUN 12  6 - 23 mg/dL   Creatinine, Ser 0.94  0.50 - 1.10 mg/dL   Calcium 8.5  8.4 - 10.5 mg/dL   GFR calc non Af Amer 55 (*) >90 mL/min   GFR calc Af Amer 64 (*) >90 mL/min   Comment:            The eGFR has been calculated     using the CKD EPI equation.     This calculation has not been     validated in all clinical      situations.     eGFR's persistently     <90 mL/min signify     possible Chronic Kidney Disease.  CBC     Status: Abnormal   Collection Time    10/29/12  4:15 AM      Result Value Range   WBC 16.5 (*) 4.0 - 10.5 K/uL   RBC 4.40  3.87 - 5.11 MIL/uL   Hemoglobin 10.9 (*) 12.0 - 15.0 g/dL   HCT 35.2 (*) 36.0 - 46.0 %   MCV 80.0  78.0 - 100.0 fL   MCH 24.8 (*) 26.0 - 34.0 pg   MCHC   31.0  30.0 - 36.0 g/dL   RDW 15.4  11.5 - 15.5 %   Platelets 345  150 - 400 K/uL  LACTIC ACID, PLASMA     Status: None   Collection Time    10/29/12  8:30 AM      Result Value Range   Lactic Acid, Venous 0.6  0.5 - 2.2 mmol/L    Dg Abd 1 View  10/28/2012  *RADIOLOGY REPORT*  Clinical Data: Follow up small bowel obstruction  ABDOMEN - 1 VIEW  Comparison: 10/27/12  Findings: Cholecystectomy clips are noted in the right upper quadrant.  Nasogastric tube tip is in the stomach.  The stomach is moderately distended with air.  There has been progression of the enteric contrast material into the colon.  No dilated loops of small bowel identified.  IMPRESSION:  1.  Progression of enteric contrast material into the colon. 2.  Gaseous distention of the stomach.   Original Report Authenticated By: Taylor Stroud, M.D.    Ct Abdomen Pelvis W Contrast  10/27/2012  *RADIOLOGY REPORT*  Clinical Data: Abdominal pain and distension  CT ABDOMEN AND PELVIS WITH CONTRAST  Technique:  Multidetector CT imaging of the abdomen and pelvis was performed following the standard protocol during bolus administration of intravenous contrast.  Contrast: 50mL OMNIPAQUE IOHEXOL 300 MG/ML  SOLN, 100mL OMNIPAQUE IOHEXOL 300 MG/ML  SOLN  Comparison: 12/28/2010  Findings: Partially imaged pacing wires along the right ventricle and coronary sinus.  Heart size within normal range.  Emphysematous changes at the lung bases.  There are linear areas of scarring within the left lung base.  Unremarkable liver.  The gallbladder not visualized.  Minimal  prominence of the CBD at 8 mm is nonspecific post cholecystectomy and similar to prior.  Unremarkable spleen, pancreas, adrenal glands.  Upper pole right renal cyst.  Lobular renal contours.  Areas of scarring.  No hydronephrosis or hydroureter.  Pelvic floor laxity.  Anterior abdominal wall hernia containing the anterior wall of a segment of transverse colon.  No CT evidence for incarceration or obstruction.  There is a short segment of jejunum with thick wall, favored to be secondary to incomplete distension. However, more distal jejunal loop within the midline lower abdomen shows mild peri visceral fat stranding and fluid.  Air fluid levels just proximally, and upper normal diameter at 2.9 cm.  The more distal small bowel loops are relatively decompressed.  No free intraperitoneal air.  No lymphadenopathy.  Portocaval lymph nodes measuring up to 9 mm short axis, nonspecific.  There is scattered atherosclerotic calcification of the aorta and its branches. No aneurysmal dilatation.  Retroaortic left renal vein.  Thin-walled bladder.  Absent uterus.  No adnexal mass.  Osteopenia and multilevel degenerative changes.  Heterogeneous attenuation of the marrow is nonspecific however similar to prior.  IMPRESSION: Segment of jejunum with perivisceral fat stranding and fluid. Most in keeping with a nonspecific enteritis (infectious, inflammatory, and ischemic considerations).  The more distal small bowel loops are decompressed.  This is favored to be due to delayed transit/ileus.  However, KUB follow-up to document passage of ingested contrast into the colon is recommended to exclude early obstruction.  Anterior abdominal wall hernia containing the anterior wall of a segment of transverse colon.  No CT evidence for incarceration or obstruction.  Heterogeneous appearance to the bones is nonspecific.  May be secondary to osteopenia or renal osteodystrophy. An infiltrative marrow process (such as myeloma or lymphoma) is not  excluded however not favored given the   stability.   Original Report Authenticated By: Andrew  DelGaizo, M.D.               Blood pressure 155/64, pulse 94, temperature 97.7 F (36.5 C), temperature source Oral, resp. rate 18, height 5' 4" (1.626 m), weight 56.7 kg (125 lb), SpO2 96.00%.  Physical exam:    general -- pleasant white female laying in bed NG tube training fairly large amount of clear bile. Lungs -- clear CV -- regular rate and rhythm without murmurs or  Gallops Abdomen -- multiple surgical scars a few bowel sounds mild nonlocalized tenderness  Assessment: 1. Nausea and vomiting/abdominal pain. This really has been a chronic problem for her and is more of an acute exacerbation. My suspicion was and remains that she has partial obstruction chronically due to adhesions. It would probably be helpful to rule out gastric outlet obstruction or proximal SBO 2. History of colon polyps 3. Multiple prior abdominal surgeries with known intra-abdominal adhesions 4. COPD and asthma 5. History of cardiac arrhythmias, myocardial infarction's, pacemaker, AICD. Patient states that Dr. Klein has disconnected the AICD due to problems with the wires.  Plan: we will go ahead and continue to suctioned overnight. Will plan EGD in the early morning to rule out gastric outlet obstruction or obstruction of duodenum   EDWARDS JR,JAMES L 10/29/2012, 11:16 AM      

## 2012-10-30 ENCOUNTER — Encounter (HOSPITAL_COMMUNITY)
Admission: EM | Disposition: A | Discharge status: Skilled Nursing Facility | Payer: Self-pay | Source: Home / Self Care | Attending: Internal Medicine

## 2012-10-30 ENCOUNTER — Inpatient Hospital Stay (HOSPITAL_COMMUNITY): Payer: Medicare Other

## 2012-10-30 ENCOUNTER — Encounter (HOSPITAL_COMMUNITY): Payer: Self-pay | Admitting: *Deleted

## 2012-10-30 HISTORY — PX: ESOPHAGOGASTRODUODENOSCOPY: SHX5428

## 2012-10-30 LAB — GLUCOSE, CAPILLARY
Glucose-Capillary: 87 mg/dL (ref 70–99)
Glucose-Capillary: 96 mg/dL (ref 70–99)
Glucose-Capillary: 98 mg/dL (ref 70–99)

## 2012-10-30 LAB — BASIC METABOLIC PANEL
Chloride: 104 mEq/L (ref 96–112)
GFR calc Af Amer: 75 mL/min — ABNORMAL LOW (ref >90)
GFR calc non Af Amer: 64 mL/min — ABNORMAL LOW (ref >90)
Potassium: 4.7 mEq/L (ref 3.5–5.1)
Sodium: 140 mEq/L (ref 135–145)

## 2012-10-30 SURGERY — EGD (ESOPHAGOGASTRODUODENOSCOPY)
Anesthesia: Moderate Sedation

## 2012-10-30 MED ORDER — FENTANYL CITRATE 0.05 MG/ML IJ SOLN
INTRAMUSCULAR | Status: DC | PRN
  Administered 2012-10-30: 25 ug via INTRAVENOUS

## 2012-10-30 MED ORDER — SODIUM CHLORIDE 0.9 % IV SOLN: INTRAVENOUS | Status: DC

## 2012-10-30 MED ORDER — MIDAZOLAM HCL 10 MG/2ML IJ SOLN
INTRAMUSCULAR | Status: AC
  Filled 2012-10-30: qty 2

## 2012-10-30 MED ORDER — INSULIN ASPART 100 UNIT/ML ~~LOC~~ SOLN
0.0000 [IU] | SUBCUTANEOUS | Status: DC
  Administered 2012-11-02: 1 [IU] via SUBCUTANEOUS
  Filled 2012-10-30: qty 3

## 2012-10-30 MED ORDER — FENTANYL CITRATE 0.05 MG/ML IJ SOLN
INTRAMUSCULAR | Status: AC
  Filled 2012-10-30: qty 2

## 2012-10-30 MED ORDER — DIPHENHYDRAMINE HCL 50 MG/ML IJ SOLN
INTRAMUSCULAR | Status: AC
  Filled 2012-10-30: qty 1

## 2012-10-30 MED ORDER — PANTOPRAZOLE SODIUM 40 MG IV SOLR
40.0000 mg | Freq: Two times a day (BID) | INTRAVENOUS | Status: DC
  Administered 2012-10-30 – 2012-11-05 (×13): 40 mg via INTRAVENOUS
  Filled 2012-10-30 (×16): qty 40

## 2012-10-30 MED ORDER — MIDAZOLAM HCL 10 MG/2ML IJ SOLN
INTRAMUSCULAR | Status: DC | PRN
  Administered 2012-10-30: 2 mg via INTRAVENOUS
  Administered 2012-10-30 (×2): 1 mg via INTRAVENOUS

## 2012-10-30 MED ORDER — BUTAMBEN-TETRACAINE-BENZOCAINE 2-2-14 % EX AERO
INHALATION_SPRAY | CUTANEOUS | Status: DC | PRN
  Administered 2012-10-30: 2 via TOPICAL

## 2012-10-30 NOTE — Progress Notes (Signed)
Day of Surgery  Subjective: Back from Endo, still a bit sleepy.  She says Dr. Randa [name] says she has several ulcers.  Report isn't available yet.  NG clamped she says she had several Bm's yesterday. Denies any nausea or vomiting with the tube clamped.  Objective: Vital signs in last 24 hours: Temp:  [97.5 F (36.4 C)-98.3 F (36.8 C)] 97.6 F (36.4 C) (03/14 0737) Pulse Rate:  [95-104] 99 (03/14 0830) Resp:  [8-48] 20 (03/14 0830) BP: (165-191)/(65-97) 188/90 mmHg (03/14 0830) SpO2:  [95 %-99 %] 97 % (03/14 0830) Last BM Date: 10/29/12 250 RECORDED FROM ng, 1 STOOL yesterday and 6 the day prior. Hypertensive, mild tachyacardia,afebrile  Intake/Output from previous day: 03/13 0701 - 03/14 0700 In: 753 [I.V.:753] Out: 250 [Emesis/NG output:250] Intake/Output this shift:    General appearance: alert, cooperative, no distress and still a bit sleepy and frail in appearance. GI: Soft, few BS, no distension noted.  +BM yesterday, several per the patient.  Lab Results:   Recent Labs  10/28/12 0415 10/29/12 0415  WBC 17.3* 16.5*  HGB 10.6* 10.9*  HCT 34.0* 35.2*  PLT 394 345    BMET  Recent Labs  10/29/12 0415 10/30/12 0545  NA 137 140  K 4.3 4.7  CL 103 104  CO2 18* 19  GLUCOSE 105* 109*  BUN 12 13  CREATININE 0.94 0.83  CALCIUM 8.5 8.7   PT/INR No results found for this basename: LABPROT, INR,  in the last 72 hours   Recent Labs Lab 10/27/12 2040  AST 13  ALT 12  ALKPHOS 133*  BILITOT 0.2*  PROT 7.0  ALBUMIN 2.5*     Lipase     Component Value Date/Time   LIPASE 21 10/27/2012 2040     Studies/Results: Dg Abd 1 View  10/30/2012  *RADIOLOGY REPORT*  Clinical Data: History of small-bowel obstruction.  ABDOMEN - 1 VIEW  Comparison: 10/29/2012.  Findings: Tip of enteric tube terminates in the area of the body of the stomach.  Distal portion of cardiac lead is seen in place. Cholecystectomy clips are seen.  Contrast is seen within the colon and rectum.   There is diverticulosis coli.  There is moderate dilatation of a few small intestinal loops.  This is similar to previous study.  There is minimal degenerative spondylosis. No opaque calculi evident.  IMPRESSION: Enteric tube in place.  Contrast seen within colon and rectum. Diverticulosis colon.  Moderate dilatation of a few small intestinal loops similar to previous study.   Original Report Authenticated By: Onalee Hua Call    Dg Abd 1 View  10/29/2012  *RADIOLOGY REPORT*  Clinical Data: Follow up gastric distention after nasogastric tube placement.  ABDOMEN - 1 VIEW  Comparison: One-view abdomen x-ray yesterday and CT abdomen and pelvis yesterday.  Findings: Complete gastric decompression after nasogastric tube placement.  Bowel gas pattern unremarkable without evidence of obstruction or significant ileus.  Small amount of residual oral contrast material from the CT yesterday throughout decompressed colon.  Surgical clips in the right upper quadrant from prior cholecystectomy.  IMPRESSION: Complete gastric decompression after nasogastric tube placement. No acute abdominal abnormality.   Original Report Authenticated By: Hulan Saas, M.D.     Medications: . antiseptic oral rinse  15 mL Mouth Rinse q12n4p  . chlorhexidine  15 mL Mouth Rinse BID  . cyanocobalamin  1,000 mcg Intramuscular Daily  . insulin aspart  0-9 Units Subcutaneous Q4H  . Levothyroxine Sodium  52 mcg Intravenous QAC breakfast  .  pantoprazole (PROTONIX) IV  40 mg Intravenous Q12H  . tiotropium  18 mcg Inhalation Daily    Assessment/Plan SBO, MULTIPLE surgeries in the past Chronic constipation CAD/PTVP/AICD/PRIOR MI/CHF AODM COPD HYPERTENSION HYPOTHYROID ARTHRITIS   Plan:  Leave the NG tube clamped for now.  See how she does, resume suction if she has nausea or distension.     LOS: 3 days    AmyWILLARD 10/30/2012

## 2012-10-30 NOTE — Interval H&P Note (Signed)
History and Physical Interval Note:  10/30/2012 8:08 AM  [name]  has presented today for surgery, with the diagnosis of N+V  The various methods of treatment have been discussed with the patient and family. After consideration of risks, benefits and other options for treatment, the patient has consented to  Procedure(s): ESOPHAGOGASTRODUODENOSCOPY (EGD) (N/A) as a surgical intervention .  The patient's history has been reviewed, patient examined, no change in status, stable for surgery.  I have reviewed the patient's chart and labs.  Questions were answered to the patient's satisfaction.     EDWARDS JR,JAMES L

## 2012-10-30 NOTE — Op Note (Signed)
Op note done in endo pro

## 2012-10-30 NOTE — Progress Notes (Addendum)
[facility] PROGRESS NOTE  [name] WUJ:[id number] DOB: 1930-02-18 DOA: 10/27/2012 PCP: Nadean Corwin, MD  Brief narrative: [name] is an 79 y.o. female with a past medical history of hypothyroidism, chronic diastolic heart failure, type 2 diabetes and an incisional hernia who was admitted 10/28/2012 with possible small bowel obstruction versus enteritis. General surgery was consulted and has been following the patient along. Serial radiographs show gastric distention as opposed to small bowel obstruction and the plan is for the patient to undergo upper endoscopy to rule out gastric outlet obstruction.  Assessment/Plan: Principal Problem:   Small bowel obstruction with abdominal pain / rule out gastric outlet obstruction -Continue conservative treatment with NG tube gastric decompression, IV fluids, and antinausea medications. -Serial radiographs show migration of CT contrast into the colon but ongoing gastric distention which improved after NG suction was resumed. -Patient was evaluated by Dr. Randa Evens on 10/29/2012, underwent endoscopy today to rule out gastric outlet obstruction or a obstruction in the duodenum. Patient apparently has gastric ulcer disease, although the note from the procedure is not yet available. -Continue PPI therapy. Active Problems:   Metabolic acidosis -Resolved. Lactic acid checked and found to be within normal limits.   Hypokalemia -Corrected with potassium added to IV fluids.    HYPOTHYROIDISM -Continue IV Synthroid. TSH 0.582, appropriately replaced.   CARDIOMYOPATHY, resolved / chronic diastolic heart failure / status post pacemaker -Normal left ventricular ejection fraction and grade 1 diastolic dysfunction noted on echocardiography 07/2010.   COPD -Stable.Continue Spiriva.   Incisional hernia -No evidence of incarceration. Continue to monitor.   Anemia, multifactorial, secondary to B12 deficiency and anemia of chronic  disease -Normocytic. Likely secondary to B12 deficiency and anemia of chronic disease. B12 levels 185. Folic acid levels greater than 20. Normal ferritin.  Iron low at less than 10. Urine iron binding capacity 219. This is consistent with anemia of chronic disease.  -Vitamin B12 supplementation initiated. -Fecal occult blood testing was negative.   Diabetes mellitus type 2 in nonobese -Continue sliding scale insulin. Glyburide on hold. -CBG is 85-104.   Code Status: Full. Family Communication: Family updated at bedside. Daughter [name] (work [phone], cell [phone]) updated at bedside 10/29/12.   Disposition Plan: Home when stable.   Medical Consultants:  Dr. Lodema Pilot, Surgery  Dr. Carman Ching, Gastroenterology  Other Consultants:  None.  Anti-infectives:  None.  HPI/Subjective: [name] feels a little bit better today. She still has some midepigastric pain. No bowel movements today or passage of flatus.  Objective: Filed Vitals:   10/29/12 0758 10/29/12 1503 10/29/12 2239 10/30/12 0535  BP:  180/65 182/68 167/73  Pulse:  95 97 98  Temp:  97.5 F (36.4 C) 97.8 F (36.6 C) 98.3 F (36.8 C)  TempSrc:  Oral Oral Oral  Resp:  16 16 16   Height:      Weight:      SpO2: 96% 97% 99% 96%    Intake/Output Summary (Last 24 hours) at 10/30/12 0720 Last data filed at 10/30/12 0600  Gross per 24 hour  Intake    753 ml  Output    250 ml  Net    503 ml    Exam: Gen:  NAD Cardiovascular:  RRR, No M/R/G Respiratory:  Lungs CTAB Gastrointestinal:  Abdomen left distended and tender, + BS Extremities:  No C/E/C  Data Reviewed: Basic Metabolic Panel:  Recent Labs Lab 10/27/12 2040 10/28/12 0415 10/29/12 0415 10/30/12 0545  NA 134* 134* 137 140  K 3.5 3.3* 4.3 4.7  CL 98 98 103 104  CO2 23 24 18* 19  GLUCOSE 137* 116* 105* 109*  BUN 10 8 12 13   CREATININE 1.08 1.01 0.94 0.83  CALCIUM 9.0 8.9 8.5 8.7   GFR Estimated Creatinine Clearance: 45.9  ml/min (by C-G formula based on Cr of 0.83). Liver Function Tests:  Recent Labs Lab 10/27/12 2040  AST 13  ALT 12  ALKPHOS 133*  BILITOT 0.2*  PROT 7.0  ALBUMIN 2.5*    Recent Labs Lab 10/27/12 2040  LIPASE 21   CBC:  Recent Labs Lab 10/27/12 2040 10/28/12 0415 10/29/12 0415  WBC 16.4* 17.3* 16.5*  NEUTROABS 13.3*  --   --   HGB 9.9* 10.6* 10.9*  HCT 31.3* 34.0* 35.2*  MCV 79.0 80.6 80.0  PLT 348 394 345   CBG:  Recent Labs Lab 10/27/12 2332 10/28/12 0210  GLUCAP 85 104*   Anemia work up  Recent Labs  10/28/12 0930  VITAMINB12 185*  FOLATE >20.0  FERRITIN 122  TIBC Not calculated due to Iron <10.  IRON <10*  RETICCTPCT 0.9     Procedures and Diagnostic Studies:  Ct Abdomen Pelvis W Contrast 10/27/2012   IMPRESSION: Segment of jejunum with perivisceral fat stranding and fluid. Most in keeping with a nonspecific enteritis (infectious, inflammatory, and ischemic considerations).  The more distal small bowel loops are decompressed.  This is favored to be due to delayed transit/ileus.  However, KUB follow-up to document passage of ingested contrast into the colon is recommended to exclude early obstruction.  Anterior abdominal wall hernia containing the anterior wall of a segment of transverse colon.  No CT evidence for incarceration or obstruction.  Heterogeneous appearance to the bones is nonspecific.  May be secondary to osteopenia or renal osteodystrophy. An infiltrative marrow process (such as myeloma or lymphoma) is not excluded however not favored given the stability.   Original Report Authenticated By: Jearld Lesch, M.D.     Dg Abd 1 View 10/28/2012 IMPRESSION:  1.  Progression of enteric contrast material into the colon. 2.  Gaseous distention of the stomach.   Original Report Authenticated By: Signa Kell, M.D.     Dg Abd 1 View 10/29/2012 IMPRESSION: Complete gastric decompression after nasogastric tube placement. No acute abdominal abnormality.    Original Report Authenticated By: Hulan Saas, M.D.      Scheduled Meds: . antiseptic oral rinse  15 mL Mouth Rinse q12n4p  . chlorhexidine  15 mL Mouth Rinse BID  . cyanocobalamin  1,000 mcg Intramuscular Daily  . Levothyroxine Sodium  52 mcg Intravenous QAC breakfast  . pantoprazole (PROTONIX) IV  40 mg Intravenous Q24H  . tiotropium  18 mcg Inhalation Daily   Continuous Infusions: . 0.9 % NaCl with KCl 40 mEq / L 75 mL/hr at 10/30/12 0122    Time spent: 35 minutes.   LOS: 3 days   RAMA,CHRISTINA  [facility] Pager [phone].  If 8PM-8AM, please contact night-coverage at [email or web address], password [facility] 10/30/2012, 7:20 AM

## 2012-10-30 NOTE — H&P (View-Only) (Signed)
[facility] CONS[facility] Reason for consult: nausea and vomiting and abdominal pain Referring Physician: [facility]. PCP: Dr.McKeown. Primary G.I.: Dr. Neville Route is an 79 y.o. female.  HPI: I have been following her for some time because of the history of prior colon polyps as well as chronic G.I. symptoms. She has had multiple prior surgeries including appendectomy, cholecystectomy, bladder suspension and other bladder surgeries, sigmoid resection due to perforated diverticulitis with temporary colostomy, umbilical hernia repair. In the past she had had unsuccessful colonoscopies due to a mobile:. She has a history of colon polyp. She has had chronic symptoms of nausea and vomiting which    has been assumed to be due to medications, adhesions etc. She often has felt full and in the past we have had her on multiple small meals. 6/12 she had follow-up colonoscopy which was difficult but we were able to reach the cecum and no polyps were saying. Because of nausea and vomiting, bloating, etc. she had EGD at that time it was felt to be normal. She has done reasonably well for the past year and a half. She's chronically constipated, has chronic bloating, and chronic nausea. She was admitted to the hospital because of one week of epigastric pain and nausea and vomiting with CT scan on admission showing possible partial small bowel obstruction versus small bowel enteritis. She is feeling somewhat better withinN G-tube suction. Repeat KUB shows dilated stomach with question of gastric outlet obstruction. Patient adamantly denies the use of any NSAIDs. She is continue to have bowel movements and pass flatus throughout this time. She has multiple other problems that have included cardiac arrhythmias. She has a pacemaker/AICD. According to the patient Dr. Graciela Husbands disable the AICD for unclear reasons and only her pacemaker is now functional. She said prior heart attacks, hypothyroidism,  diabetes, asthma and COPD. Her multiple surgeries are noted above.  Past Medical History  Diagnosis Date  . CONGESTIVE HEART FAILURE 06/03/2007  . COPD 06/03/2007  . HYPERTENSION 06/03/2007  . HYPOTHYROIDISM 06/03/2007  . Hernia   . Asthma   . Diabetes mellitus   . Hyperlipidemia   . Arthritis   . Myocardial infarction   . Osteoporosis     Past Surgical History  Procedure Laterality Date  . Cardiac defibrillator placement    . Appendectomy    . Hysterectomy - unknown type    . Posterior fusion clivus-c2 transarticular stealth guided w/ icbg    . Cystectomy      elbow  . Icd      medtronic maximo  . Polypectomy      bladder    Family History  Problem Relation Age of Onset  . Heart disease Mother   . Breast cancer Mother   . Brain cancer Mother     Social History:  reports that she quit smoking about 16 years ago. She has never used smokeless tobacco. She reports that she does not drink alcohol or use illicit drugs.  Allergies:  Allergies  Allergen Reactions  . Ace Inhibitors   . Acetaminophen-Codeine     REACTION: n/v  . Atorvastatin   . Bisoprolol Fumarate   . Ezetimibe-Simvastatin   . Fluvastatin Sodium   . Gabapentin   . Gemfibrozil   . Lisinopril     REACTION: unspecified  . Oxycodone Hcl     REACTION: vomiting  . Rosuvastatin     Medications; . antiseptic oral rinse  15 mL Mouth Rinse q12n4p  . chlorhexidine  15  mL Mouth Rinse BID  . cyanocobalamin  1,000 mcg Intramuscular Daily  . Levothyroxine Sodium  52 mcg Intravenous QAC breakfast  . pantoprazole (PROTONIX) IV  40 mg Intravenous Q24H  . tiotropium  18 mcg Inhalation Daily   PRN Meds acetaminophen, acetaminophen, hydrALAZINE, morphine injection, ondansetron (ZOFRAN) IV, ondansetron Results for orders placed during the hospital encounter of 10/27/12 (from the past 48 hour(s))  CBC WITH DIFFERENTIAL     Status: Abnormal   Collection Time    10/27/12  8:40 PM      Result Value Range   WBC  16.4 (*) 4.0 - 10.5 K/uL   RBC 3.96  3.87 - 5.11 MIL/uL   Hemoglobin 9.9 (*) 12.0 - 15.0 g/dL   HCT 44.0 (*) 34.7 - 42.5 %   MCV 79.0  78.0 - 100.0 fL   MCH 25.0 (*) 26.0 - 34.0 pg   MCHC 31.6  30.0 - 36.0 g/dL   RDW 95.6  38.7 - 56.4 %   Platelets 348  150 - 400 K/uL   Neutrophils Relative 81 (*) 43 - 77 %   Neutro Abs 13.3 (*) 1.7 - 7.7 K/uL   Lymphocytes Relative 9 (*) 12 - 46 %   Lymphs Abs 1.5  0.7 - 4.0 K/uL   Monocytes Relative 9  3 - 12 %   Monocytes Absolute 1.5 (*) 0.1 - 1.0 K/uL   Eosinophils Relative 1  0 - 5 %   Eosinophils Absolute 0.1  0.0 - 0.7 K/uL   Basophils Relative 0  0 - 1 %   Basophils Absolute 0.0  0.0 - 0.1 K/uL  COMPREHENSIVE METABOLIC PANEL     Status: Abnormal   Collection Time    10/27/12  8:40 PM      Result Value Range   Sodium 134 (*) 135 - 145 mEq/L   Potassium 3.5  3.5 - 5.1 mEq/L   Chloride 98  96 - 112 mEq/L   CO2 23  19 - 32 mEq/L   Glucose, Bld 137 (*) 70 - 99 mg/dL   BUN 10  6 - 23 mg/dL   Creatinine, Ser 3.32  0.50 - 1.10 mg/dL   Calcium 9.0  8.4 - 95.1 mg/dL   Total Protein 7.0  6.0 - 8.3 g/dL   Albumin 2.5 (*) 3.5 - 5.2 g/dL   AST 13  0 - 37 U/L   ALT 12  0 - 35 U/L   Alkaline Phosphatase 133 (*) 39 - 117 U/L   Total Bilirubin 0.2 (*) 0.3 - 1.2 mg/dL   GFR calc non Af Amer 47 (*) >90 mL/min   GFR calc Af Amer 54 (*) >90 mL/min   Comment:            The eGFR has been calculated     using the CKD EPI equation.     This calculation has not been     validated in all clinical     situations.     eGFR's persistently     <90 mL/min signify     possible Chronic Kidney Disease.  LIPASE, BLOOD     Status: None   Collection Time    10/27/12  8:40 PM      Result Value Range   Lipase 21  11 - 59 U/L  URINALYSIS, ROUTINE W REFLEX MICROSCOPIC     Status: None   Collection Time    10/27/12 10:21 PM      Result Value Range  Color, Urine YELLOW  YELLOW   APPearance CLEAR  CLEAR   Specific Gravity, Urine 1.016  1.005 - 1.030   pH 5.5   5.0 - 8.0   Glucose, UA NEGATIVE  NEGATIVE mg/dL   Hgb urine dipstick NEGATIVE  NEGATIVE   Bilirubin Urine NEGATIVE  NEGATIVE   Ketones, ur NEGATIVE  NEGATIVE mg/dL   Protein, ur NEGATIVE  NEGATIVE mg/dL   Urobilinogen, UA 0.2  0.0 - 1.0 mg/dL   Nitrite NEGATIVE  NEGATIVE   Leukocytes, UA NEGATIVE  NEGATIVE   Comment: MICROSCOPIC NOT DONE ON URINES WITH NEGATIVE PROTEIN, BLOOD, LEUKOCYTES, NITRITE, OR GLUCOSE <1000 mg/dL.  GLUCOSE, CAPILLARY     Status: None   Collection Time    10/27/12 11:32 PM      Result Value Range   Glucose-Capillary 85  70 - 99 mg/dL   Comment 1 Notify RN     Comment 2 Documented in Chart    OCCULT BLOOD X 1 CARD TO LAB, STOOL     Status: None   Collection Time    10/28/12 12:16 AM      Result Value Range   Fecal Occult Bld NEGATIVE  NEGATIVE  GLUCOSE, CAPILLARY     Status: Abnormal   Collection Time    10/28/12  2:10 AM      Result Value Range   Glucose-Capillary 104 (*) 70 - 99 mg/dL  BASIC METABOLIC PANEL     Status: Abnormal   Collection Time    10/28/12  4:15 AM      Result Value Range   Sodium 134 (*) 135 - 145 mEq/L   Potassium 3.3 (*) 3.5 - 5.1 mEq/L   Chloride 98  96 - 112 mEq/L   CO2 24  19 - 32 mEq/L   Glucose, Bld 116 (*) 70 - 99 mg/dL   BUN 8  6 - 23 mg/dL   Creatinine, Ser 0.45  0.50 - 1.10 mg/dL   Calcium 8.9  8.4 - 40.9 mg/dL   GFR calc non Af Amer 51 (*) >90 mL/min   GFR calc Af Amer 59 (*) >90 mL/min   Comment:            The eGFR has been calculated     using the CKD EPI equation.     This calculation has not been     validated in all clinical     situations.     eGFR's persistently     <90 mL/min signify     possible Chronic Kidney Disease.  CBC     Status: Abnormal   Collection Time    10/28/12  4:15 AM      Result Value Range   WBC 17.3 (*) 4.0 - 10.5 K/uL   RBC 4.22  3.87 - 5.11 MIL/uL   Hemoglobin 10.6 (*) 12.0 - 15.0 g/dL   HCT 81.1 (*) 91.4 - 78.2 %   MCV 80.6  78.0 - 100.0 fL   MCH 25.1 (*) 26.0 - 34.0 pg    MCHC 31.2  30.0 - 36.0 g/dL   RDW 95.6  21.3 - 08.6 %   Platelets 394  150 - 400 K/uL  TSH     Status: None   Collection Time    10/28/12  9:30 AM      Result Value Range   TSH 0.582  0.350 - 4.500 uIU/mL  VITAMIN B12     Status: Abnormal   Collection Time    10/28/12  9:30  AM      Result Value Range   Vitamin B-12 185 (*) 211 - 911 pg/mL  FOLATE     Status: None   Collection Time    10/28/12  9:30 AM      Result Value Range   Folate >20.0     Comment: (NOTE)     Reference Ranges            Deficient:       0.4 - 3.3 ng/mL            Indeterminate:   3.4 - 5.4 ng/mL            Normal:              > 5.4 ng/mL  IRON AND TIBC     Status: Abnormal   Collection Time    10/28/12  9:30 AM      Result Value Range   Iron <10 (*) 42 - 135 ug/dL   TIBC Not calculated due to Iron <10.  250 - 470 ug/dL   Saturation Ratios Not calculated due to Iron <10.  20 - 55 %   UIBC 219  125 - 400 ug/dL  FERRITIN     Status: None   Collection Time    10/28/12  9:30 AM      Result Value Range   Ferritin 122  10 - 291 ng/mL  RETICULOCYTES     Status: None   Collection Time    10/28/12  9:30 AM      Result Value Range   Retic Ct Pct 0.9  0.4 - 3.1 %   RBC. 4.18  3.87 - 5.11 MIL/uL   Retic Count, Manual 37.6  19.0 - 186.0 K/uL  BASIC METABOLIC PANEL     Status: Abnormal   Collection Time    10/29/12  4:15 AM      Result Value Range   Sodium 137  135 - 145 mEq/L   Potassium 4.3  3.5 - 5.1 mEq/L   Comment: DELTA CHECK NOTED     REPEATED TO VERIFY     NO VISIBLE HEMOLYSIS   Chloride 103  96 - 112 mEq/L   CO2 18 (*) 19 - 32 mEq/L   Glucose, Bld 105 (*) 70 - 99 mg/dL   BUN 12  6 - 23 mg/dL   Creatinine, Ser 1.91  0.50 - 1.10 mg/dL   Calcium 8.5  8.4 - 47.8 mg/dL   GFR calc non Af Amer 55 (*) >90 mL/min   GFR calc Af Amer 64 (*) >90 mL/min   Comment:            The eGFR has been calculated     using the CKD EPI equation.     This calculation has not been     validated in all clinical      situations.     eGFR's persistently     <90 mL/min signify     possible Chronic Kidney Disease.  CBC     Status: Abnormal   Collection Time    10/29/12  4:15 AM      Result Value Range   WBC 16.5 (*) 4.0 - 10.5 K/uL   RBC 4.40  3.87 - 5.11 MIL/uL   Hemoglobin 10.9 (*) 12.0 - 15.0 g/dL   HCT 29.5 (*) 62.1 - 30.8 %   MCV 80.0  78.0 - 100.0 fL   MCH 24.8 (*) 26.0 - 34.0 pg   MCHC  31.0  30.0 - 36.0 g/dL   RDW 16.1  09.6 - 04.5 %   Platelets 345  150 - 400 K/uL  LACTIC ACID, PLASMA     Status: None   Collection Time    10/29/12  8:30 AM      Result Value Range   Lactic Acid, Venous 0.6  0.5 - 2.2 mmol/L    Dg Abd 1 View  10/28/2012  *RADIOLOGY REPORT*  Clinical Data: Follow up small bowel obstruction  ABDOMEN - 1 VIEW  Comparison: 10/27/12  Findings: Cholecystectomy clips are noted in the right upper quadrant.  Nasogastric tube tip is in the stomach.  The stomach is moderately distended with air.  There has been progression of the enteric contrast material into the colon.  No dilated loops of small bowel identified.  IMPRESSION:  1.  Progression of enteric contrast material into the colon. 2.  Gaseous distention of the stomach.   Original Report Authenticated By: Signa Kell, M.D.    Ct Abdomen Pelvis W Contrast  10/27/2012  *RADIOLOGY REPORT*  Clinical Data: Abdominal pain and distension  CT ABDOMEN AND PELVIS WITH CONTRAST  Technique:  Multidetector CT imaging of the abdomen and pelvis was performed following the standard protocol during bolus administration of intravenous contrast.  Contrast: 50mL OMNIPAQUE IOHEXOL 300 MG/ML  SOLN, OMNIPAQUE IOHEXOL 300 MG/ML  SOLN  Comparison: 12/28/2010  Findings: Partially imaged pacing wires along the right ventricle and coronary sinus.  Heart size within normal range.  Emphysematous changes at the lung bases.  There are linear areas of scarring within the left lung base.  Unremarkable liver.  The gallbladder not visualized.  Minimal  prominence of the CBD at 8 mm is nonspecific post cholecystectomy and similar to prior.  Unremarkable spleen, pancreas, adrenal glands.  Upper pole right renal cyst.  Lobular renal contours.  Areas of scarring.  No hydronephrosis or hydroureter.  Pelvic floor laxity.  Anterior abdominal wall hernia containing the anterior wall of a segment of transverse colon.  No CT evidence for incarceration or obstruction.  There is a short segment of jejunum with thick wall, favored to be secondary to incomplete distension. However, more distal jejunal loop within the midline lower abdomen shows mild peri visceral fat stranding and fluid.  Air fluid levels just proximally, and upper normal diameter at 2.9 cm.  The more distal small bowel loops are relatively decompressed.  No free intraperitoneal air.  No lymphadenopathy.  Portocaval lymph nodes measuring up to 9 mm short axis, nonspecific.  There is scattered atherosclerotic calcification of the aorta and its branches. No aneurysmal dilatation.  Retroaortic left renal vein.  Thin-walled bladder.  Absent uterus.  No adnexal mass.  Osteopenia and multilevel degenerative changes.  Heterogeneous attenuation of the marrow is nonspecific however similar to prior.  IMPRESSION: Segment of jejunum with perivisceral fat stranding and fluid. Most in keeping with a nonspecific enteritis (infectious, inflammatory, and ischemic considerations).  The more distal small bowel loops are decompressed.  This is favored to be due to delayed transit/ileus.  However, KUB follow-up to document passage of ingested contrast into the colon is recommended to exclude early obstruction.  Anterior abdominal wall hernia containing the anterior wall of a segment of transverse colon.  No CT evidence for incarceration or obstruction.  Heterogeneous appearance to the bones is nonspecific.  May be secondary to osteopenia or renal osteodystrophy. An infiltrative marrow process (such as myeloma or lymphoma) is not  excluded however not favored given the  stability.   Original Report Authenticated By: Jearld Lesch, M.D.               Blood pressure 155/64, pulse 94, temperature 97.7 F (36.5 C), temperature source Oral, resp. rate 18, height 5\' 4"  (1.626 m), weight 56.7 kg (125 lb), SpO2 96.00%.  Physical exam:    general -- pleasant white female laying in bed NG tube training fairly large amount of clear bile. Lungs -- clear CV -- regular rate and rhythm without murmurs or  Gallops Abdomen -- multiple surgical scars a few bowel sounds mild nonlocalized tenderness  Assessment: 1. Nausea and vomiting/abdominal pain. This really has been a chronic problem for her and is more of an acute exacerbation. My suspicion was and remains that she has partial obstruction chronically due to adhesions. It would probably be helpful to rule out gastric outlet obstruction or proximal SBO 2. History of colon polyps 3. Multiple prior abdominal surgeries with known intra-abdominal adhesions 4. COPD and asthma 5. History of cardiac arrhythmias, myocardial infarction's, pacemaker, AICD. Patient states that Dr. Graciela Husbands has disconnected the AICD due to problems with the wires.  Plan: we will go ahead and continue to suctioned overnight. Will plan EGD in the early morning to rule out gastric outlet obstruction or obstruction of duodenum   Ermel Verne JR,Laurelin Elson L 10/29/2012, 11:16 AM

## 2012-10-31 LAB — CBC
HCT: 33.5 % — ABNORMAL LOW (ref 36.0–46.0)
MCH: 24.9 pg — ABNORMAL LOW (ref 26.0–34.0)
MCV: 79.6 fL (ref 78.0–100.0)
Platelets: 379 10*3/uL (ref 150–400)
RBC: 4.21 MIL/uL (ref 3.87–5.11)
RDW: 15.7 % — ABNORMAL HIGH (ref 11.5–15.5)

## 2012-10-31 LAB — BASIC METABOLIC PANEL
BUN: 13 mg/dL (ref 6–23)
CO2: 22 mEq/L (ref 19–32)
Calcium: 8.5 mg/dL (ref 8.4–10.5)
Chloride: 103 mEq/L (ref 96–112)
Creatinine, Ser: 0.79 mg/dL (ref 0.50–1.10)

## 2012-10-31 LAB — GLUCOSE, CAPILLARY
Glucose-Capillary: 95 mg/dL (ref 70–99)
Glucose-Capillary: 83 mg/dL (ref 70–99)

## 2012-10-31 MED ORDER — SUCRALFATE 1 GM/10ML PO SUSP
1.0000 g | Freq: Four times a day (QID) | ORAL | Status: DC
  Administered 2012-10-31 – 2012-11-06 (×24): 1 g via ORAL
  Filled 2012-10-31 (×28): qty 10

## 2012-10-31 MED ORDER — METOCLOPRAMIDE HCL 5 MG/ML IJ SOLN
5.0000 mg | Freq: Four times a day (QID) | INTRAMUSCULAR | Status: DC
Start: 2012-10-31 — End: 2012-11-06
  Administered 2012-10-31 – 2012-11-06 (×23): 5 mg via INTRAVENOUS
  Filled 2012-10-31: qty 1
  Filled 2012-10-31: qty 2
  Filled 2012-10-31 (×8): qty 1
  Filled 2012-10-31: qty 2
  Filled 2012-10-31 (×4): qty 1
  Filled 2012-10-31: qty 2
  Filled 2012-10-31 (×4): qty 1
  Filled 2012-10-31: qty 2
  Filled 2012-10-31 (×7): qty 1

## 2012-10-31 NOTE — Progress Notes (Signed)
[facility] PROGRESS NOTE  [name] YNW:[id number] DOB: [birth year] DOA: 10/27/2012 PCP: Nadean Corwin, MD  Brief narrative: [name] is an 79 y.o. female with a past medical history of hypothyroidism, chronic diastolic heart failure, type 2 diabetes and an incisional hernia who was admitted 10/28/2012 with possible small bowel obstruction versus enteritis. General surgery was consulted and has been following the patient along. Serial radiographs show gastric distention versus partial versus partial small bowel obstruction, underwent EGD for further evaluation on 10/30/12 which showed possible gastric ulcerations versus NG trauma, but no GOO.  Assessment/Plan: Principal Problem:   Small bowel obstruction with abdominal pain / rule out gastric outlet obstruction -Continue conservative treatment with NG tube gastric decompression, IV fluids, and antinausea medications. -Serial radiographs show migration of CT contrast into the colon but ongoing gastric distention which improved after NG suction was resumed. -Patient was evaluated by Dr. Randa Evens on 10/29/2012, underwent endoscopy today to rule out gastric outlet obstruction or a obstruction in the duodenum. Endoscopy report now available, shows possible also versus NG tube trauma. No gastric outlet obstruction. Findings are consistent with partial small bowel obstruction, as contrast has advanced into the colon. -Continue PPI therapy.  Carafate added.  NG to be removed to allow sips of clears per surgery. Active Problems:   Metabolic acidosis -Resolved. Lactic acid checked and found to be within normal limits.   Hypokalemia -Corrected with potassium added to IV fluids.    HYPOTHYROIDISM -Continue IV Synthroid. TSH 0.582, appropriately replaced.   CARDIOMYOPATHY, resolved / chronic diastolic heart failure / status post pacemaker -Normal left ventricular ejection fraction and grade 1 diastolic dysfunction noted on  echocardiography 07/2010.   COPD -Stable.Continue Spiriva.   Incisional hernia -No evidence of incarceration. Continue to monitor.   Anemia, multifactorial, secondary to B12 deficiency and anemia of chronic disease -Normocytic. Likely secondary to B12 deficiency and anemia of chronic disease. B12 levels 185. Folic acid levels greater than 20. Normal ferritin.  Iron low at less than 10. Urine iron binding capacity 219. This is consistent with anemia of chronic disease.  -Vitamin B12 supplementation initiated. -Fecal occult blood testing was negative.   Diabetes mellitus type 2 in nonobese -Continue sliding scale insulin. Glyburide on hold. -CBG is 87-98.   Code Status: Full. Family Communication: Family updated at bedside. Daughter [name] (work [phone], cell [phone]) updated by telephone 10/30/2012.   Disposition Plan: Home when stable.   Medical Consultants:  Dr. Lodema Pilot, Surgery  Dr. Carman Ching, Gastroenterology  Other Consultants:  None.  Anti-infectives:  None.  HPI/Subjective: [name] feels nauseated today and had some vomiting.  Reports passing flatus.  Objective: Filed Vitals:   10/30/12 0940 10/30/12 1407 10/30/12 2130 10/31/12 0715  BP: 178/79 157/72 160/79 179/89  Pulse:  99 98 99  Temp:  98.3 F (36.8 C) 98.3 F (36.8 C) 98 F (36.7 C)  TempSrc:  Oral Oral Oral  Resp: 25 20 20 20   Height:      Weight:      SpO2: 82% 98% 97% 96%   No intake or output data in the 24 hours ending 10/31/12 0945  Exam: Gen:  NAD Cardiovascular:  RRR, No M/R/G; Very tender chest wall to palpation on left. Respiratory:  Lungs CTAB Gastrointestinal:  Abdomen less distended and tender, + BS Extremities:  No C/E/C  Data Reviewed: Basic Metabolic Panel:  Recent Labs Lab 10/27/12 2040 10/28/12 0415 10/29/12 0415 10/30/12 0545 10/31/12 0758  NA 134* 134* 137 140  137  K 3.5 3.3* 4.3 4.7 4.5  CL 98 98 103 104 103  CO2 23 24 18* 19 22  GLUCOSE  137* 116* 105* 109* 95  BUN 10 8 12 13 13   CREATININE 1.08 1.01 0.94 0.83 PENDING  CALCIUM 9.0 8.9 8.5 8.7 8.5   GFR CrCl cannot be calculated (Patient has no sCr result on file.). Liver Function Tests:  Recent Labs Lab 10/27/12 2040  AST 13  ALT 12  ALKPHOS 133*  BILITOT 0.2*  PROT 7.0  ALBUMIN 2.5*    Recent Labs Lab 10/27/12 2040  LIPASE 21   CBC:  Recent Labs Lab 10/27/12 2040 10/28/12 0415 10/29/12 0415 10/31/12 0758  WBC 16.4* 17.3* 16.5* 16.6*  NEUTROABS 13.3*  --   --   --   HGB 9.9* 10.6* 10.9* 10.5*  HCT 31.3* 34.0* 35.2* 33.5*  MCV 79.0 80.6 80.0 79.6  PLT 348 394 345 379   CBG:  Recent Labs Lab 10/30/12 1825 10/30/12 2018 10/31/12 10/31/12 0411 10/31/12 0739  GLUCAP 98 95 87 95 83   Anemia work up No results found for this basename: VITAMINB12, FOLATE, FERRITIN, TIBC, IRON, RETICCTPCT,  in the last 72 hours   Procedures and Diagnostic Studies:  Ct Abdomen Pelvis W Contrast 10/27/2012   IMPRESSION: Segment of jejunum with perivisceral fat stranding and fluid. Most in keeping with a nonspecific enteritis (infectious, inflammatory, and ischemic considerations).  The more distal small bowel loops are decompressed.  This is favored to be due to delayed transit/ileus.  However, KUB follow-up to document passage of ingested contrast into the colon is recommended to exclude early obstruction.  Anterior abdominal wall hernia containing the anterior wall of a segment of transverse colon.  No CT evidence for incarceration or obstruction.  Heterogeneous appearance to the bones is nonspecific.  May be secondary to osteopenia or renal osteodystrophy. An infiltrative marrow process (such as myeloma or lymphoma) is not excluded however not favored given the stability.   Original Report Authenticated By: Jearld Lesch, M.D.     Dg Abd 1 View 10/28/2012 IMPRESSION:  1.  Progression of enteric contrast material into the colon. 2.  Gaseous distention of the  stomach.   Original Report Authenticated By: Signa Kell, M.D.     Dg Abd 1 View 10/29/2012 IMPRESSION: Complete gastric decompression after nasogastric tube placement. No acute abdominal abnormality.   Original Report Authenticated By: Hulan Saas, M.D.     Dg Abd 1 View 10/30/2012  IMPRESSION: Enteric tube in place.  Contrast seen within colon and rectum. Diverticulosis colon.  Moderate dilatation of a few small intestinal loops similar to previous study.   Original Report Authenticated By: Onalee Hua Call     Scheduled Meds: . antiseptic oral rinse  15 mL Mouth Rinse q12n4p  . chlorhexidine  15 mL Mouth Rinse BID  . cyanocobalamin  1,000 mcg Intramuscular Daily  . insulin aspart  0-9 Units Subcutaneous Q4H  . Levothyroxine Sodium  52 mcg Intravenous QAC breakfast  . pantoprazole (PROTONIX) IV  40 mg Intravenous Q12H  . sucralfate  1 g Oral QID  . tiotropium  18 mcg Inhalation Daily   Continuous Infusions: . 0.9 % NaCl with KCl 40 mEq / L 75 mL/hr at 10/30/12 1215    Time spent: 25 minutes.   LOS: 4 days   [name]  [facility] Pager [phone].  If 8PM-8AM, please contact night-coverage at [email or web address], password [facility] 10/31/2012, 9:45 AM

## 2012-10-31 NOTE — Progress Notes (Signed)
1 Day Post-Op  Subjective: C/o upper abd discomfort. Some nausea. +flatus and BM per pt. Pt retching while in room. Looks uncomfortable. EGD showed multiple gastric and distal esoph ulcers  Objective: Vital signs in last 24 hours: Temp:  [98 F (36.7 C)-98.3 F (36.8 C)] 98 F (36.7 C) (03/15 0715) Pulse Rate:  [98-99] 99 (03/15 0715) Resp:  [17-25] 20 (03/15 0715) BP: (157-179)/(72-89) 179/89 mmHg (03/15 0715) SpO2:  [82 %-98 %] 96 % (03/15 0715) Last BM Date: 10/29/12  Intake/Output from previous day:   Intake/Output this shift:    Alert, uncomfortable Soft, nd. +BS. Some upper abd TTP. No RT/guarding  Lab Results:   Recent Labs  10/29/12 0415 10/31/12 0758  WBC 16.5* 16.6*  HGB 10.9* 10.5*  HCT 35.2* 33.5*  PLT 345 379   BMET  Recent Labs  10/30/12 0545 10/31/12 0758  NA 140 137  K 4.7 4.5  CL 104 103  CO2 19 22  GLUCOSE 109* 95  BUN 13 13  CREATININE 0.83 PENDING  CALCIUM 8.7 8.5   PT/INR No results found for this basename: LABPROT, INR,  in the last 72 hours ABG No results found for this basename: PHART, PCO2, PO2, HCO3,  in the last 72 hours  Studies/Results: Dg Abd 1 View  10/30/2012  *RADIOLOGY REPORT*  Clinical Data: History of small-bowel obstruction.  ABDOMEN - 1 VIEW  Comparison: 10/29/2012.  Findings: Tip of enteric tube terminates in the area of the body of the stomach.  Distal portion of cardiac lead is seen in place. Cholecystectomy clips are seen.  Contrast is seen within the colon and rectum.  There is diverticulosis coli.  There is moderate dilatation of a few small intestinal loops.  This is similar to previous study.  There is minimal degenerative spondylosis. No opaque calculi evident.  IMPRESSION: Enteric tube in place.  Contrast seen within colon and rectum. Diverticulosis colon.  Moderate dilatation of a few small intestinal loops similar to previous study.   Original Report Authenticated By: Onalee Hua Call    Dg Abd 1 View  10/29/2012   *RADIOLOGY REPORT*  Clinical Data: Follow up gastric distention after nasogastric tube placement.  ABDOMEN - 1 VIEW  Comparison: One-view abdomen x-ray yesterday and CT abdomen and pelvis yesterday.  Findings: Complete gastric decompression after nasogastric tube placement.  Bowel gas pattern unremarkable without evidence of obstruction or significant ileus.  Small amount of residual oral contrast material from the CT yesterday throughout decompressed colon.  Surgical clips in the right upper quadrant from prior cholecystectomy.  IMPRESSION: Complete gastric decompression after nasogastric tube placement. No acute abdominal abnormality.   Original Report Authenticated By: Hulan Saas, M.D.     Anti-infectives: Anti-infectives   None      Assessment/Plan: s/p Procedure(s): ESOPHAGOGASTRODUODENOSCOPY (EGD) (N/A)  SBO Gastric/distal esoph ulcers - NSAIDS vs NG tube trauma?  Ng tube only has put out 100cc/24hrs and has been clamped. Reconnected to suction for several minutes. Only about 50cc bile out. Given low output and findings of ulcers will d/c NG tube since may be aggravating ulcers.   Continue IV protonix Add carafate Sips as tolerated OOB  Mary Sella. Andrey Campanile, MD, FACS General, Bariatric, & Minimally Invasive Surgery [facility], [place]   LOS: 4 days    [name] 10/31/2012

## 2012-10-31 NOTE — ED Provider Notes (Signed)
Medical screening examination/treatment/procedure(s) were performed by non-physician practitioner and as supervising physician I was immediately available for consultation/collaboration.  Fahd Galea T Oday Ridings, MD 10/31/12 1636 

## 2012-11-01 LAB — GLUCOSE, CAPILLARY
Glucose-Capillary: 77 mg/dL (ref 70–99)
Glucose-Capillary: 86 mg/dL (ref 70–99)
Glucose-Capillary: 88 mg/dL (ref 70–99)
Glucose-Capillary: 96 mg/dL (ref 70–99)
Glucose-Capillary: 98 mg/dL (ref 70–99)

## 2012-11-01 MED ORDER — LEVOTHYROXINE SODIUM 100 MCG IV SOLR
50.0000 ug | Freq: Every day | INTRAVENOUS | Status: DC
  Administered 2012-11-01 – 2012-11-03 (×3): 50 ug via INTRAVENOUS
  Filled 2012-11-01 (×4): qty 5

## 2012-11-01 MED ORDER — LEVOTHYROXINE SODIUM 100 MCG IV SOLR
52.0000 ug | Freq: Every day | INTRAVENOUS | Status: DC
  Filled 2012-11-01 (×2): qty 5

## 2012-11-01 NOTE — Progress Notes (Signed)
2 Days Post-Op  Subjective: Feels better today. No n/v. Some periumbilical pain. +flatus. Recorded a BM yesterday. Nurse reports pt tolerated gingerale  Objective: Vital signs in last 24 hours: Temp:  [97.6 F (36.4 C)-99.3 F (37.4 C)] 98.4 F (36.9 C) (03/16 0542) Pulse Rate:  [97-99] 97 (03/16 0542) Resp:  [18-20] 20 (03/16 0542) BP: (146-181)/(63-85) 156/63 mmHg (03/16 0542) SpO2:  [93 %-100 %] 93 % (03/16 0542) Last BM Date: 10/31/12  Intake/Output from previous day: 03/15 0701 - 03/16 0700 In: 805 [I.V.:805] Out: 400 [Urine:400] Intake/Output this shift:    Alert, nad Soft, nt, nd. Old incision  Lab Results:   Recent Labs  10/31/12 0758  WBC 16.6*  HGB 10.5*  HCT 33.5*  PLT 379   BMET  Recent Labs  10/30/12 0545 10/31/12 0758  NA 140 137  K 4.7 4.5  CL 104 103  CO2 19 22  GLUCOSE 109* 95  BUN 13 13  CREATININE 0.83 0.79  CALCIUM 8.7 8.5   PT/INR No results found for this basename: LABPROT, INR,  in the last 72 hours ABG No results found for this basename: PHART, PCO2, PO2, HCO3,  in the last 72 hours  Studies/Results: No results found.  Anti-infectives: Anti-infectives   None      Assessment/Plan: s/p Procedure(s): ESOPHAGOGASTRODUODENOSCOPY (EGD) (N/A) Gastric ulcers - ?NG tube trauma, cont PPI, carafate pSBO - adv diet to clears Incisional hernia with non-obstructive colon - follow clinically PT/OT  [name]. Amy Campanile, MD, FACS General, Bariatric, & Minimally Invasive Surgery [facility], [place]   LOS: 5 days    [name] 11/01/2012

## 2012-11-01 NOTE — Progress Notes (Signed)
Subjective: Nausea and vomiting improved; NGT removed. Chronic abdominal pain persists.  Objective: Vital signs in last 24 hours: Temp:  [97.6 F (36.4 C)-99.3 F (37.4 C)] 98.4 F (36.9 C) (03/16 0542) Pulse Rate:  [97-99] 97 (03/16 0542) Resp:  [18-20] 20 (03/16 0542) BP: (146-181)/(63-85) 156/63 mmHg (03/16 0542) SpO2:  [93 %-100 %] 93 % (03/16 0542) Weight change:  Last BM Date: 10/31/12  PE: GEN:  Chronically ill-appearing, NAD ABD:  Mild diffuse abdominal tenderness  Lab Results: CBC    Component Value Date/Time   WBC 16.6* 10/31/2012 0758   RBC 4.21 10/31/2012 0758   HGB 10.5* 10/31/2012 0758   HCT 33.5* 10/31/2012 0758   PLT 379 10/31/2012 0758   MCV 79.6 10/31/2012 0758   MCH 24.9* 10/31/2012 0758   MCHC 31.3 10/31/2012 0758   RDW 15.7* 10/31/2012 0758   LYMPHSABS 1.5 10/27/2012 2040   MONOABS 1.5* 10/27/2012 2040   EOSABS 0.1 10/27/2012 2040   BASOSABS 0.0 10/27/2012 2040   Assessment:  1.  Chronic abdominal pain.  Managed as outpatient by Dr. Randa Evens 2.  Gastric ulcers/erosions, biopsied.  Suspect NSAID or NGT-related.  Doubt this has any role in her current more acute symptoms. 3.  Partial small bowel obstruction.  Plan:  1.  Management of partial small bowel obstruction per surgery.  Patient is to have clear liquid diet today.2 2.  Will sign-off; Dr. Randa Evens can follow-up gastric biopsies and follow-up with her for her chronic abdominal pain as outpatient after discharge.   [name] 11/01/2012, 12:08 PM

## 2012-11-01 NOTE — Progress Notes (Signed)
[facility] PROGRESS NOTE  [name] MVH:[id number] DOB: Mar 12, 1930 DOA: 10/27/2012 PCP: Nadean Corwin, MD  Brief narrative: [name] is an 79 y.o. female with a past medical history of hypothyroidism, chronic diastolic heart failure, type 2 diabetes and an incisional hernia who was admitted 10/28/2012 with possible small bowel obstruction versus enteritis. General surgery was consulted and has been following the patient along. Serial radiographs show gastric distention versus partial versus partial small bowel obstruction, underwent EGD for further evaluation on 10/30/12 which showed possible gastric ulcerations versus NG trauma, but no GOO.  Assessment/Plan: Principal Problem:   Small bowel obstruction with abdominal pain / rule out gastric outlet obstruction -Continue conservative treatment with NG tube gastric decompression, IV fluids, and antinausea medications. -Serial radiographs show migration of CT contrast into the colon but ongoing gastric distention which improved after NG suction was resumed. -Patient was evaluated by Dr. Randa Evens on 10/29/2012, underwent endoscopy today to rule out gastric outlet obstruction or a obstruction in the duodenum. Endoscopy report now available, shows possible also versus NG tube trauma. No gastric outlet obstruction. Findings are consistent with partial small bowel obstruction, as contrast has advanced into the colon. -Continue PPI therapy.  Carafate and Reglan added.  NG tube remains out with no documented nausea or vomiting. Active Problems:   Metabolic acidosis -Resolved. Lactic acid checked and found to be within normal limits.   Hypokalemia -Corrected with potassium added to IV fluids.    HYPOTHYROIDISM -Continue IV Synthroid. TSH 0.582, appropriately replaced.   CARDIOMYOPATHY, resolved / chronic diastolic heart failure / status post pacemaker -Normal left ventricular ejection fraction and grade 1 diastolic  dysfunction noted on echocardiography 07/2010.   COPD -Stable.Continue Spiriva.   Incisional hernia -No evidence of incarceration. Continue to monitor.   Anemia, multifactorial, secondary to B12 deficiency and anemia of chronic disease -Normocytic. Likely secondary to B12 deficiency and anemia of chronic disease. B12 levels 185. Folic acid levels greater than 20. Normal ferritin.  Iron low at less than 10. Urine iron binding capacity 219. This is consistent with anemia of chronic disease.  -Vitamin B12 supplementation initiated. -Fecal occult blood testing was negative.   Diabetes mellitus type 2 in nonobese -Continue sliding scale insulin. Glyburide on hold. -CBG is 84-98.   Code Status: Full. Family Communication: Husband updated at bedside.  Disposition Plan: Home when stable.   Medical Consultants:  Dr. Lodema Pilot, Surgery  Dr. Carman Ching, Gastroenterology  Other Consultants:  None.  Anti-infectives:  None.  HPI/Subjective: [name] still reports that her stomach feels sore, but she has not had any nausea or vomiting and has tolerated limited liquids. She did move her bowels yesterday per nursing staff. No dyspnea.  Objective: Filed Vitals:   10/31/12 0715 10/31/12 1421 10/31/12 2146 11/01/12 0542  BP: 179/89 146/85 181/71 156/63  Pulse: 99 99 97 97  Temp: 98 F (36.7 C) 97.6 F (36.4 C) 99.3 F (37.4 C) 98.4 F (36.9 C)  TempSrc: Oral Oral Oral Oral  Resp: 20 18 18 20   Height:      Weight:      SpO2: 96% 100% 99% 93%    Intake/Output Summary (Last 24 hours) at 11/01/12 1041 Last data filed at 10/31/12 1816  Gross per 24 hour  Intake    730 ml  Output    400 ml  Net    330 ml    Exam: Gen:  NAD Cardiovascular:  RRR, No M/R/G Respiratory:  Lungs CTAB Gastrointestinal:  Abdomen less distended and tender, + BS Extremities:  No C/E/C  Data Reviewed: Basic Metabolic Panel:  Recent Labs Lab 10/27/12 2040 10/28/12 0415  10/29/12 0415 10/30/12 0545 10/31/12 0758  NA 134* 134* 137 140 137  K 3.5 3.3* 4.3 4.7 4.5  CL 98 98 103 104 103  CO2 23 24 18* 19 22  GLUCOSE 137* 116* 105* 109* 95  BUN 10 8 12 13 13   CREATININE 1.08 1.01 0.94 0.83 0.79  CALCIUM 9.0 8.9 8.5 8.7 8.5   GFR Estimated Creatinine Clearance: 47.6 ml/min (by C-G formula based on Cr of 0.79). Liver Function Tests:  Recent Labs Lab 10/27/12 2040  AST 13  ALT 12  ALKPHOS 133*  BILITOT 0.2*  PROT 7.0  ALBUMIN 2.5*    Recent Labs Lab 10/27/12 2040  LIPASE 21   CBC:  Recent Labs Lab 10/27/12 2040 10/28/12 0415 10/29/12 0415 10/31/12 0758  WBC 16.4* 17.3* 16.5* 16.6*  NEUTROABS 13.3*  --   --   --   HGB 9.9* 10.6* 10.9* 10.5*  HCT 31.3* 34.0* 35.2* 33.5*  MCV 79.0 80.6 80.0 79.6  PLT 348 394 345 379   CBG:  Recent Labs Lab 10/31/12 1553 10/31/12 1945 10/31/12 2347 11/01/12 0429 11/01/12 0721  GLUCAP 99 96 98 84 86   Anemia work up No results found for this basename: VITAMINB12, FOLATE, FERRITIN, TIBC, IRON, RETICCTPCT,  in the last 72 hours   Procedures and Diagnostic Studies:  Ct Abdomen Pelvis W Contrast 10/27/2012   IMPRESSION: Segment of jejunum with perivisceral fat stranding and fluid. Most in keeping with a nonspecific enteritis (infectious, inflammatory, and ischemic considerations).  The more distal small bowel loops are decompressed.  This is favored to be due to delayed transit/ileus.  However, KUB follow-up to document passage of ingested contrast into the colon is recommended to exclude early obstruction.  Anterior abdominal wall hernia containing the anterior wall of a segment of transverse colon.  No CT evidence for incarceration or obstruction.  Heterogeneous appearance to the bones is nonspecific.  May be secondary to osteopenia or renal osteodystrophy. An infiltrative marrow process (such as myeloma or lymphoma) is not excluded however not favored given the stability.   Original Report  Authenticated By: Jearld Lesch, M.D.     Dg Abd 1 View 10/28/2012 IMPRESSION:  1.  Progression of enteric contrast material into the colon. 2.  Gaseous distention of the stomach.   Original Report Authenticated By: Signa Kell, M.D.     Dg Abd 1 View 10/29/2012 IMPRESSION: Complete gastric decompression after nasogastric tube placement. No acute abdominal abnormality.   Original Report Authenticated By: Hulan Saas, M.D.     Dg Abd 1 View 10/30/2012  IMPRESSION: Enteric tube in place.  Contrast seen within colon and rectum. Diverticulosis colon.  Moderate dilatation of a few small intestinal loops similar to previous study.   Original Report Authenticated By: Onalee Hua Call     Scheduled Meds: . antiseptic oral rinse  15 mL Mouth Rinse q12n4p  . chlorhexidine  15 mL Mouth Rinse BID  . cyanocobalamin  1,000 mcg Intramuscular Daily  . insulin aspart  0-9 Units Subcutaneous Q4H  . levothyroxine  50 mcg Intravenous QAC breakfast  . metoCLOPramide (REGLAN) injection  5 mg Intravenous Q6H  . pantoprazole (PROTONIX) IV  40 mg Intravenous Q12H  . sucralfate  1 g Oral QID  . tiotropium  18 mcg Inhalation Daily   Continuous Infusions: . 0.9 % NaCl with KCl 40  mEq / L 75 mL/hr at 11/01/12 0530    Time spent: 25 minutes.   LOS: 5 days   Bennye Nix  [facility] Pager [phone].  If 8PM-8AM, please contact night-coverage at [email or web address], password [facility] 11/01/2012, 10:41 AM

## 2012-11-01 NOTE — Progress Notes (Signed)
11/01/12 0751 Patient c/o pain with her left hand. Appears that patient may have had an old IV in her left hand. She has purplish bruising on the top of left hand. Warm compress applied for comfort. Will notify MD.

## 2012-11-02 ENCOUNTER — Encounter (HOSPITAL_COMMUNITY): Payer: Self-pay | Admitting: Gastroenterology

## 2012-11-02 ENCOUNTER — Inpatient Hospital Stay (HOSPITAL_COMMUNITY): Payer: Medicare Other

## 2012-11-02 ENCOUNTER — Telehealth: Payer: Self-pay | Admitting: Internal Medicine

## 2012-11-02 DIAGNOSIS — K5289 Other specified noninfective gastroenteritis and colitis: Secondary | ICD-10-CM

## 2012-11-02 DIAGNOSIS — I428 Other cardiomyopathies: Secondary | ICD-10-CM

## 2012-11-02 LAB — BASIC METABOLIC PANEL
Chloride: 100 mEq/L (ref 96–112)
GFR calc Af Amer: 90 mL/min — ABNORMAL LOW (ref >90)
GFR calc non Af Amer: 77 mL/min — ABNORMAL LOW (ref >90)
Potassium: 4.8 mEq/L (ref 3.5–5.1)
Sodium: 134 mEq/L — ABNORMAL LOW (ref 135–145)

## 2012-11-02 LAB — GLUCOSE, CAPILLARY
Glucose-Capillary: 127 mg/dL — ABNORMAL HIGH (ref 70–99)
Glucose-Capillary: 89 mg/dL (ref 70–99)
Glucose-Capillary: 92 mg/dL (ref 70–99)
Glucose-Capillary: 93 mg/dL (ref 70–99)
Glucose-Capillary: 97 mg/dL (ref 70–99)

## 2012-11-02 LAB — CBC
HCT: 33.2 % — ABNORMAL LOW (ref 36.0–46.0)
MCHC: 31.3 g/dL (ref 30.0–36.0)
Platelets: 381 10*3/uL (ref 150–400)
RDW: 15.9 % — ABNORMAL HIGH (ref 11.5–15.5)
WBC: 14.6 10*3/uL — ABNORMAL HIGH (ref 4.0–10.5)

## 2012-11-02 LAB — PRO B NATRIURETIC PEPTIDE: Pro B Natriuretic peptide (BNP): 6510 pg/mL — ABNORMAL HIGH (ref 0–450)

## 2012-11-02 MED ORDER — ALBUTEROL SULFATE (5 MG/ML) 0.5% IN NEBU
2.5000 mg | INHALATION_SOLUTION | RESPIRATORY_TRACT | Status: DC | PRN
  Administered 2012-11-02: 2.5 mg via RESPIRATORY_TRACT
  Filled 2012-11-02: qty 0.5

## 2012-11-02 MED ORDER — POTASSIUM CHLORIDE IN NACL 20-0.9 MEQ/L-% IV SOLN
INTRAVENOUS | Status: AC
  Administered 2012-11-02: 11:00:00 via INTRAVENOUS
  Filled 2012-11-02 (×2): qty 1000

## 2012-11-02 MED ORDER — CLINIMIX E/DEXTROSE (5/15) 5 % IV SOLN
INTRAVENOUS | Status: DC
  Filled 2012-11-02: qty 1000

## 2012-11-02 MED ORDER — FAT EMULSION 20 % IV EMUL
240.0000 mL | INTRAVENOUS | Status: DC
  Filled 2012-11-02: qty 250

## 2012-11-02 MED ORDER — FUROSEMIDE 10 MG/ML IJ SOLN
20.0000 mg | Freq: Once | INTRAMUSCULAR | Status: AC
  Administered 2012-11-02: 20 mg via INTRAVENOUS
  Filled 2012-11-02: qty 2

## 2012-11-02 MED ORDER — ADULT MULTIVITAMIN W/MINERALS CH
1.0000 | ORAL_TABLET | Freq: Every day | ORAL | Status: DC
  Administered 2012-11-03 – 2012-11-06 (×4): 1 via ORAL
  Filled 2012-11-02 (×4): qty 1

## 2012-11-02 MED ORDER — DILTIAZEM HCL ER COATED BEADS 240 MG PO CP24
240.0000 mg | ORAL_CAPSULE | Freq: Every day | ORAL | Status: DC
  Administered 2012-11-02 – 2012-11-06 (×5): 240 mg via ORAL
  Filled 2012-11-02 (×6): qty 1

## 2012-11-02 MED ORDER — POTASSIUM CHLORIDE IN NACL 20-0.9 MEQ/L-% IV SOLN
INTRAVENOUS | Status: DC
  Administered 2012-11-02 – 2012-11-06 (×4): via INTRAVENOUS
  Filled 2012-11-02 (×5): qty 1000

## 2012-11-02 NOTE — Telephone Encounter (Signed)
New problem   Pt's daughter want you to know her mom is in [facility] hospital. She would like to speak to you concerning her mom.

## 2012-11-02 NOTE — Op Note (Signed)
[facility] [address] [place], [address]   ENDOSCOPY PROCEDURE REPORT  PATIENT: [name], [name]  MR#: [id number] BIRTHDATE: 11/06/1929 , 83  yrs. old GENDER: Female ENDOSCOPIST:Maddalena Linarez, MD REFERRED BY:  [facility]. PCP: Dr.McKeown PROCEDURE DATE:  10/30/2012 PROCEDURE:   EGD in biopsy ASA CLASS:    class III INDICATIONS: probable SBO, abdominal pain, dilated stomach: CT scan  MEDICATION: fentanyl 25 mcg, versed 4 milligrams IV TOPICAL ANESTHETIC:  cetacaine spray  DESCRIPTION OF PROCEDURE:   the patient's NG tube was removed after she had been sedated in the [employer] adult upper scope was passed into the esophagus with deglutination. To stomach was entered in the proximal body of this, were multiple shallow ulcerations in a concentric manner. these were shallow and in some circumstances linear. There was no active bleeding. Several biopsies were taken. The antrum and fundus of the stomach were examined in the foward, retroflex view and otherwise was normal. there was no active bleeding. The scope is passed into the duodenum and the 2nd duodenum and duodenal bulb or normal. The scope was withdrawn. In the distal soft is just above GU junction was a shallow moderate size ulcer it was not actively bleeding. The patient tolerated procedure well. The NG tube was replaced at the termination of the procedure.     COMPLICATIONS: None  ENDOSCOPIC IMPRESSION: 1. No obstruction at the gastric outlet or proximal duodenum 2.Multiple concentric alterations in the proximal stomach without active bleeding. These were biopsied. Question if they could in some way be related to NG tube trauma versus NSAIDs etc. 3. Moderate sized shallow ulcer in the distal esophagus just above GU junction not actively bleeding   RECOMMENDATIONS: we will go ahead and replace NG tube to low suction since it appears that patient may have small bowel obstruction  possibly due to intra-abdominal adhesions. Would recommend aggressive and acid therapy. Will likely need EGD in about 2 months to document healing of  gastric ulcerations    _______________________________ eSignedCarman Ching, MD 10/30/2012 8:42 AM   CC: Dr.W Oneta Rack   PATIENT NAME:  [name] MR#: [id number]

## 2012-11-02 NOTE — Consult Note (Signed)
CARDIOLOGY CONSULT NOTE    Patient ID: JORY WELKE MRN: [id number] DOB/AGE: 1929-09-24 79 y.o.  Admit date: 10/27/2012 Referring Physician:  Rama Primary Physician: Nadean Corwin, MD Primary Cardiologist:  Graciela Husbands Reason for Consultation: CHF  Principal Problem:   Small bowel obstruction Active Problems:   HYPOTHYROIDISM   CARDIOMYOPATHY, resolved   Chronic diastolic heart failure   COPD   Biventricular cardiac pacemaker Medtronic   Incisional hernia   Abdominal pain   Anemia, multi-factorial, secondary to B12 deficiency and anemia of chronic disease   Diabetes mellitus type 2 in nonobese   Hypokalemia   HPI:  79 yo admitted with abdominal pain and SBO.  History of DCM with AICD and biV pacing .  Last echo 2011 EF returned to normal 60-65% Previous abdominal surgery for diverticulitis  Still with abdominal distension and only eating bits of jello.  She denies palpitations , chest pain and really  To me no real dyspnea although apparently has had some per primary service. CXR to day with NAD but BNP over 6,000.  Not on diuretic at home Since admission she is up 2.7 liters.  No AICD discharges Had migration and was replaced in 2012  @ROS @ All other systems reviewed and negative except as noted above  Past Medical History  Diagnosis Date  . CONGESTIVE HEART FAILURE 06/03/2007  . COPD 06/03/2007  . HYPERTENSION 06/03/2007  . HYPOTHYROIDISM 06/03/2007  . Hernia   . Asthma   . Diabetes mellitus   . Hyperlipidemia   . Arthritis   . Myocardial infarction   . Osteoporosis     Family History  Problem Relation Age of Onset  . Heart disease Mother   . Breast cancer Mother   . Brain cancer Mother     History   Social History  . Marital Status: Married    Spouse Name: N/A    Number of Children: N/A  . Years of Education: N/A   Occupational History  . retired Air traffic controller    Social History Main Topics  . Smoking status: Former Smoker -- 2.00 packs/day  for 30 years    Quit date: 08/19/1996  . Smokeless tobacco: Never Used  . Alcohol Use: No  . Drug Use: No  . Sexually Active: Not on file   Other Topics Concern  . Not on file   Social History Narrative  . No narrative on file    Past Surgical History  Procedure Laterality Date  . Cardiac defibrillator placement    . Appendectomy    . Hysterectomy - unknown type    . Posterior fusion clivus-c2 transarticular stealth guided w/ icbg    . Cystectomy      elbow  . Icd      medtronic maximo  . Polypectomy      bladder  . Esophagogastroduodenoscopy N/A 10/30/2012    Procedure: ESOPHAGOGASTRODUODENOSCOPY (EGD);  Surgeon: Vertell Novak., MD;  Location: [facility];  Service: Endoscopy;  Laterality: N/A;     . antiseptic oral rinse  15 mL Mouth Rinse q12n4p  . chlorhexidine  15 mL Mouth Rinse BID  . cyanocobalamin  1,000 mcg Intramuscular Daily  . diltiazem  240 mg Oral Daily  . insulin aspart  0-9 Units Subcutaneous Q4H  . levothyroxine  50 mcg Intravenous QAC breakfast  . metoCLOPramide (REGLAN) injection  5 mg Intravenous Q6H  . [START ON 11/03/2012] multivitamin with minerals  1 tablet Oral Daily  . pantoprazole (PROTONIX) IV  40 mg Intravenous Q12H  .  sucralfate  1 g Oral QID  . tiotropium  18 mcg Inhalation Daily   . 0.9 % NaCl with KCl 20 mEq / L    . fat emulsion    . TPN (CLINIMIX) +/- additives      Physical Exam:   Affect appropriate Frail elderly female HEENT: normal Neck supple with no adenopathy JVP mildly elevated no bruits no thyromegaly AICD low under left clavicle Lungs clear with no wheezing and good diaphragmatic motion Heart:  S1/S2 no murmur, no rub, gallop or click PMI normal Abdomen: mild distension BS positve, midl tenderness, no AAA no bruit.  No HSM or HJR Distal pulses intact with no bruits No edema Neuro non-focal Skin warm and dry No muscular weakness  Labs:   Lab Results  Component Value Date   WBC 14.6* 11/02/2012   HGB  10.4* 11/02/2012   HCT 33.2* 11/02/2012   MCV 78.9 11/02/2012   PLT 381 11/02/2012    Recent Labs Lab 10/27/12 2040  11/02/12 0410  NA 134*  < > 134*  K 3.5  < > 4.8  CL 98  < > 100  CO2 23  < > 20  BUN 10  < > 9  CREATININE 1.08  < > 0.75  CALCIUM 9.0  < > 8.6  PROT 7.0  --   --   BILITOT 0.2*  --   --   ALKPHOS 133*  --   --   ALT 12  --   --   AST 13  --   --   GLUCOSE 137*  < > 92  < > = values in this interval not displayed. Lab Results  Component Value Date   CKTOTAL 148 04/24/2008   CKMB 3.0 04/24/2008   TROPONINI  Value: <0.01        NO INDICATION OF MYOCARDIAL INJURY. 04/24/2008    Lab Results  Component Value Date   CHOL  Value: 162        ATP III CLASSIFICATION:  <200     mg/dL   Desirable  161-096  mg/dL   Borderline High  >=045    mg/dL   High        4/0/9811   CHOL  Value: 157        ATP III CLASSIFICATION:  <200     mg/dL   Desirable  914-782  mg/dL   Borderline High  >=956    mg/dL   High 09/19/3084   Lab Results  Component Value Date   HDL 38* 04/25/2008   Lab Results  Component Value Date   LDLCALC  Value: 104        Total Cholesterol/HDL:CHD Risk Coronary Heart Disease Risk Table                     Men   Women  1/2 Average Risk   3.4   3.3* 04/25/2008   Lab Results  Component Value Date   TRIG 164* 01/17/2010   TRIG 75 04/25/2008   Lab Results  Component Value Date   CHOLHDL 4.1 04/25/2008   No results found for this basename: LDLDIRECT      Radiology: Dg Chest 1 View  11/02/2012  **ADDENDUM** CREATED: 11/02/2012 16:40:08  Please disregard in the original report.  Due to a PACS error, the wrong images (from a different patient) were interpreted.  This has been corrected and the correct study is now loaded on PACS.  The correct report is as  follows:  *RADIOLOGY REPORT*  Clinical Data: Congestion  CHEST - 1 VIEW  Comparison:  11/05/2010  Findings: Chronic interstitial markings/bronchitic changes.  No focal consolidation. No pleural effusion or pneumothorax.  The  heart is normal in size.  Left subclavian ICD.  IMPRESSION: No evidence of acute cardiopulmonary disease.  **END ADDENDUM** SIGNED BY: Charline Bills, M.D.   11/02/2012  *RADIOLOGY REPORT*  Clinical Data: Chest congestion, cough  CHEST - 1 VIEW  Comparison: 11/05/2010  Findings: Lungs are clear.  No pleural effusion or pneumothorax.  The heart is top normal in size.  Postsurgical changes related to prior CABG.  Mild degenerative changes of the thoracic spine.  Multiple bilateral rib fracture deformities.  IMPRESSION: No evidence of acute cardiopulmonary disease.   Original Report Authenticated By: Charline Bills, M.D.    Dg Abd 1 View  11/02/2012  *RADIOLOGY REPORT*  Clinical Data: Small bowel obstruction.  Abdominal pain  ABDOMEN - 1 VIEW  Comparison: 10/30/2012, 10/27/2012  Findings: There is gas throughout the colon extending to the rectum.  The colon is nondilated.  Small bowel is not dilated. There is contrast in the colon from recent CT.  IMPRESSION: Mild colonic distention.  Negative for small-bowel obstruction.   Original Report Authenticated By: Janeece Riggers, M.D.    Dg Abd 1 View  10/30/2012  *RADIOLOGY REPORT*  Clinical Data: History of small-bowel obstruction.  ABDOMEN - 1 VIEW  Comparison: 10/29/2012.  Findings: Tip of enteric tube terminates in the area of the body of the stomach.  Distal portion of cardiac lead is seen in place. Cholecystectomy clips are seen.  Contrast is seen within the colon and rectum.  There is diverticulosis coli.  There is moderate dilatation of a few small intestinal loops.  This is similar to previous study.  There is minimal degenerative spondylosis. No opaque calculi evident.  IMPRESSION: Enteric tube in place.  Contrast seen within colon and rectum. Diverticulosis colon.  Moderate dilatation of a few small intestinal loops similar to previous study.   Original Report Authenticated By: Onalee Hua Call    Dg Abd 1 View  10/29/2012  *RADIOLOGY REPORT*  Clinical Data:  Follow up gastric distention after nasogastric tube placement.  ABDOMEN - 1 VIEW  Comparison: One-view abdomen x-ray yesterday and CT abdomen and pelvis yesterday.  Findings: Complete gastric decompression after nasogastric tube placement.  Bowel gas pattern unremarkable without evidence of obstruction or significant ileus.  Small amount of residual oral contrast material from the CT yesterday throughout decompressed colon.  Surgical clips in the right upper quadrant from prior cholecystectomy.  IMPRESSION: Complete gastric decompression after nasogastric tube placement. No acute abdominal abnormality.   Original Report Authenticated By: Hulan Saas, M.D.    Dg Abd 1 View  10/28/2012  *RADIOLOGY REPORT*  Clinical Data: Follow up small bowel obstruction  ABDOMEN - 1 VIEW  Comparison: 10/27/12  Findings: Cholecystectomy clips are noted in the right upper quadrant.  Nasogastric tube tip is in the stomach.  The stomach is moderately distended with air.  There has been progression of the enteric contrast material into the colon.  No dilated loops of small bowel identified.  IMPRESSION:  1.  Progression of enteric contrast material into the colon. 2.  Gaseous distention of the stomach.   Original Report Authenticated By: Signa Kell, M.D.    Ct Abdomen Pelvis W Contrast  10/27/2012  *RADIOLOGY REPORT*  Clinical Data: Abdominal pain and distension  CT ABDOMEN AND PELVIS WITH CONTRAST  Technique:  Multidetector  CT imaging of the abdomen and pelvis was performed following the standard protocol during bolus administration of intravenous contrast.  Contrast: 50mL OMNIPAQUE IOHEXOL 300 MG/ML  SOLN, OMNIPAQUE IOHEXOL 300 MG/ML  SOLN  Comparison: 12/28/2010  Findings: Partially imaged pacing wires along the right ventricle and coronary sinus.  Heart size within normal range.  Emphysematous changes at the lung bases.  There are linear areas of scarring within the left lung base.  Unremarkable liver.  The  gallbladder not visualized.  Minimal prominence of the CBD at 8 mm is nonspecific post cholecystectomy and similar to prior.  Unremarkable spleen, pancreas, adrenal glands.  Upper pole right renal cyst.  Lobular renal contours.  Areas of scarring.  No hydronephrosis or hydroureter.  Pelvic floor laxity.  Anterior abdominal wall hernia containing the anterior wall of a segment of transverse colon.  No CT evidence for incarceration or obstruction.  There is a short segment of jejunum with thick wall, favored to be secondary to incomplete distension. However, more distal jejunal loop within the midline lower abdomen shows mild peri visceral fat stranding and fluid.  Air fluid levels just proximally, and upper normal diameter at 2.9 cm.  The more distal small bowel loops are relatively decompressed.  No free intraperitoneal air.  No lymphadenopathy.  Portocaval lymph nodes measuring up to 9 mm short axis, nonspecific.  There is scattered atherosclerotic calcification of the aorta and its branches. No aneurysmal dilatation.  Retroaortic left renal vein.  Thin-walled bladder.  Absent uterus.  No adnexal mass.  Osteopenia and multilevel degenerative changes.  Heterogeneous attenuation of the marrow is nonspecific however similar to prior.  IMPRESSION: Segment of jejunum with perivisceral fat stranding and fluid. Most in keeping with a nonspecific enteritis (infectious, inflammatory, and ischemic considerations).  The more distal small bowel loops are decompressed.  This is favored to be due to delayed transit/ileus.  However, KUB follow-up to document passage of ingested contrast into the colon is recommended to exclude early obstruction.  Anterior abdominal wall hernia containing the anterior wall of a segment of transverse colon.  No CT evidence for incarceration or obstruction.  Heterogeneous appearance to the bones is nonspecific.  May be secondary to osteopenia or renal osteodystrophy. An infiltrative marrow process  (such as myeloma or lymphoma) is not excluded however not favored given the stability.   Original Report Authenticated By: Jearld Lesch, M.D.    US Venous Img Lower Unilateral Left  10/06/2012  *RADIOLOGY REPORT*  Clinical Data:  left lower extremity pain and swelling  LEFT  LOWER EXTREMITY VENOUS DUPLEX ULTRASOUND  Technique:  Gray-scale sonography with graded compression, as well as color Doppler and duplex ultrasound were performed to evaluate the deep venous system of the lower extremity from the level of the common femoral vein through the popliteal and proximal calf veins. Spectral Doppler was utilized to evaluate flow at rest and with distal augmentation maneuvers.  Comparison:  None.  Findings:  Normal compressibility of the common femoral, superficial femoral, and popliteal veins is demonstrated, as well as the visualized proximal calf veins.  No filling defects to suggest DVT on grayscale or color Doppler imaging.  Doppler waveforms show normal direction of venous flow, normal respiratory phasicity and response to augmentation.  IMPRESSION: No evidence of lower extremity deep vein thrombosis.   Original Report Authenticated By: Judie Petit. Shick, M.D.     EKG: 3/11 SR narrow complex V pacing consistant with normal BiV function   ASSESSMENT AND PLAN:  DCM:  Repeat echo Last EF normal. Up 2.7 liters since admission.  Will give Lasix x 1 and follow Clinically she has no CHF and CXR no cephalization or edema.  AICD Working well with narrow complex pacing SBO:  Advance diet as tolerated still with distension and pain  Signed: Charlton Haws 11/02/2012, 6:04 PM

## 2012-11-02 NOTE — Progress Notes (Signed)
General Surgery [facility]  Patient seen and examined.  1.  Abdominal pain - presumed gastric/duodenal ulcer.  Medical Rx started.  2.  SBO - resolved.  AXR today with only slight colonic distension.  NG out.  Taking po clear liquids.  3.  Incisional hernia - soft, non-tender, reducible.  Not an issue presently.  Will follow one more day.  No role for surgical intervention.  Management per GI consultant and medical service.  Velora Heckler, MD, [facility] Office: [phone]

## 2012-11-02 NOTE — Progress Notes (Signed)
Patient ID: [name], female   DOB: February 11, 1930, 79 y.o.   MRN: [id number] 3 Days Post-Op  Subjective: Feels a little worse today, more distention, +flatus, no bm yesterday denies n/v  Objective: Vital signs in last 24 hours: Temp:  [98.4 F (36.9 C)-98.8 F (37.1 C)] 98.4 F (36.9 C) (03/17 0532) Pulse Rate:  [52-101] 101 (03/17 0737) Resp:  [18-20] 18 (03/17 0532) BP: (165-184)/(70-77) 184/71 mmHg (03/17 0737) SpO2:  [96 %-98 %] 98 % (03/17 0532) Last BM Date: 10/31/12  Intake/Output from previous day: 03/16 0701 - 03/17 0700 In: 1839.3 [I.V.:1839.3] Out: 950 [Urine:950] Intake/Output this shift:    PE: Abd: moderately distended, faint bowel sounds, more tender General: awake, alert, NAD Heart: rrr Lungs: cta b   Lab Results:   Recent Labs  10/31/12 0758 11/02/12 0410  WBC 16.6* 14.6*  HGB 10.5* 10.4*  HCT 33.5* 33.2*  PLT 379 381   BMET  Recent Labs  10/31/12 0758 11/02/12 0410  NA 137 134*  K 4.5 4.8  CL 103 100  CO2 22 20  GLUCOSE 95 92  BUN 13 9  CREATININE 0.79 0.75  CALCIUM 8.5 8.6   PT/INR No results found for this basename: LABPROT, INR,  in the last 72 hours CMP     Component Value Date/Time   NA 134* 11/02/2012 0410   K 4.8 11/02/2012 0410   CL 100 11/02/2012 0410   CO2 20 11/02/2012 0410   GLUCOSE 92 11/02/2012 0410   BUN 9 11/02/2012 0410   CREATININE 0.75 11/02/2012 0410   CALCIUM 8.6 11/02/2012 0410   PROT 7.0 10/27/2012 2040   ALBUMIN 2.5* 10/27/2012 2040   AST 13 10/27/2012 2040   ALT 12 10/27/2012 2040   ALKPHOS 133* 10/27/2012 2040   BILITOT 0.2* 10/27/2012 2040   GFRNONAA 77* 11/02/2012 0410   GFRAA 90* 11/02/2012 0410   Lipase     Component Value Date/Time   LIPASE 21 10/27/2012 2040       Studies/Results: No results found.  Anti-infectives: Anti-infectives   None       Assessment/Plan 1. Abd pain/SBO: a little worse today subjectively, seems to be stale mate right now with bowels,  --?repeat abd films  this am   --OOB as tolerated   LOS: 6 days    Keidan Aumiller 11/02/2012

## 2012-11-02 NOTE — Progress Notes (Addendum)
[facility] PROGRESS NOTE  BIRDELLA SIPPEL WGN:[id number] DOB: [birth year] DOA: 10/27/2012 PCP: Nadean Corwin, MD  Brief narrative: [name] is an 79 y.o. female with a past medical history of hypothyroidism, chronic diastolic heart failure, type 2 diabetes and an incisional hernia who was admitted 10/28/2012 with possible small bowel obstruction versus enteritis. General surgery was consulted and has been following the patient along. Serial radiographs show gastric distention versus partial versus partial small bowel obstruction, underwent EGD for further evaluation on 10/30/12 which showed possible gastric ulcerations versus NG trauma, but no GOO.  Assessment/Plan: Principal Problem:   Small bowel obstruction with abdominal pain / rule out gastric outlet obstruction -Continue conservative treatment with NG tube gastric decompression, IV fluids, and antinausea medications. -Serial radiographs show migration of CT contrast into the colon but ongoing gastric distention which improved after NG suction was resumed. -Patient was evaluated by Dr. Randa Evens on 10/29/2012, underwent endoscopy 10/30/12 to rule out gastric outlet obstruction or a obstruction in the duodenum. Endoscopy showed possible ulcer versus NG tube trauma. No gastric outlet obstruction.  -Findings are consistent with partial small bowel obstruction, as contrast has advanced into the colon. -Continue PPI therapy, Carafate and Reglan.  NG tube remains out with no documented nausea or vomiting. Active Problems:   Hypertension -Takes diltiazem at home.  Has hydralazine ordered as needed. -Will resume diltiazem as tolerated.   Metabolic acidosis -Resolved. Lactic acid checked and found to be within normal limits.   Hypokalemia -Corrected with potassium added to IV fluids.    HYPOTHYROIDISM -Continue IV Synthroid. TSH 0.582, appropriately replaced.   CARDIOMYOPATHY, resolved / chronic diastolic heart failure /  status post pacemaker -Normal left ventricular ejection fraction and grade 1 diastolic dysfunction noted on echocardiography 07/2010.   COPD -Stable.Continue Spiriva.   Incisional hernia -No evidence of incarceration. Continue to monitor.   Anemia, multifactorial, secondary to B12 deficiency and anemia of chronic disease -Normocytic. Likely secondary to B12 deficiency and anemia of chronic disease. B12 levels 185. Folic acid levels greater than 20. Normal ferritin.  Iron low at less than 10. Urine iron binding capacity 219. This is consistent with anemia of chronic disease.  -Vitamin B12 supplementation initiated. -Fecal occult blood testing was negative. -H&H stable.   Diabetes mellitus type 2 in nonobese -Continue sliding scale insulin. Glyburide on hold. -CBG is 88-112.   Code Status: Full. Family Communication: No one at bedside this morning. Husband updated by telephone. Disposition Plan: Home when stable.   Medical Consultants:  Dr. Lodema Pilot, Surgery  Dr. Carman Ching, Gastroenterology  Other Consultants:  None.  Anti-infectives:  None.  HPI/Subjective: [name] received morphine at 6 AM and is still a bit sedated and slow to respond. She continues to report abdominal pain and soreness. Some nausea but no frank vomiting. Appetite is poor with very little by mouth intake. States that she's been having flatus.  Objective: Filed Vitals:   11/01/12 0542 11/01/12 1437 11/01/12 2146 11/02/12 0532  BP: 156/63 178/70 165/77 179/72  Pulse: 97 96 98 52  Temp: 98.4 F (36.9 C) 98.4 F (36.9 C) 98.8 F (37.1 C) 98.4 F (36.9 C)  TempSrc: Oral Oral Oral Oral  Resp: 20 18 20 18   Height:      Weight:      SpO2: 93% 96% 97% 98%    Intake/Output Summary (Last 24 hours) at 11/02/12 0735 Last data filed at 11/02/12 0601  Gross per 24 hour  Intake 1839.25 ml  Output  950 ml  Net 889.25 ml    Exam: Gen:  NAD Cardiovascular:  RRR, No  M/R/G Respiratory:  Lungs CTAB Gastrointestinal:  Abdomen less distended and tender, + BS Extremities:  No C/E/C  Data Reviewed: Basic Metabolic Panel:  Recent Labs Lab 10/28/12 0415 10/29/12 0415 10/30/12 0545 10/31/12 0758 11/02/12 0410  NA 134* 137 140 137 134*  K 3.3* 4.3 4.7 4.5 4.8  CL 98 103 104 103 100  CO2 24 18* 19 22 20   GLUCOSE 116* 105* 109* 95 92  BUN 8 12 13 13 9   CREATININE 1.01 0.94 0.83 0.79 0.75  CALCIUM 8.9 8.5 8.7 8.5 8.6   GFR Estimated Creatinine Clearance: 47.6 ml/min (by C-G formula based on Cr of 0.75). Liver Function Tests:  Recent Labs Lab 10/27/12 2040  AST 13  ALT 12  ALKPHOS 133*  BILITOT 0.2*  PROT 7.0  ALBUMIN 2.5*    Recent Labs Lab 10/27/12 2040  LIPASE 21   CBC:  Recent Labs Lab 10/27/12 2040 10/28/12 0415 10/29/12 0415 10/31/12 0758 11/02/12 0410  WBC 16.4* 17.3* 16.5* 16.6* 14.6*  NEUTROABS 13.3*  --   --   --   --   HGB 9.9* 10.6* 10.9* 10.5* 10.4*  HCT 31.3* 34.0* 35.2* 33.5* 33.2*  MCV 79.0 80.6 80.0 79.6 78.9  PLT 348 394 345 379 381   CBG:  Recent Labs Lab 11/01/12 1158 11/01/12 1605 11/01/12 1944 11/01/12 2344 11/02/12 0351  GLUCAP 88 112* 90 77 97    Procedures and Diagnostic Studies:  Ct Abdomen Pelvis W Contrast 10/27/2012   IMPRESSION: Segment of jejunum with perivisceral fat stranding and fluid. Most in keeping with a nonspecific enteritis (infectious, inflammatory, and ischemic considerations).  The more distal small bowel loops are decompressed.  This is favored to be due to delayed transit/ileus.  However, KUB follow-up to document passage of ingested contrast into the colon is recommended to exclude early obstruction.  Anterior abdominal wall hernia containing the anterior wall of a segment of transverse colon.  No CT evidence for incarceration or obstruction.  Heterogeneous appearance to the bones is nonspecific.  May be secondary to osteopenia or renal osteodystrophy. An infiltrative marrow  process (such as myeloma or lymphoma) is not excluded however not favored given the stability.   Original Report Authenticated By: Jearld Lesch, M.D.     Dg Abd 1 View 10/28/2012 IMPRESSION:  1.  Progression of enteric contrast material into the colon. 2.  Gaseous distention of the stomach.   Original Report Authenticated By: Signa Kell, M.D.     Dg Abd 1 View 10/29/2012 IMPRESSION: Complete gastric decompression after nasogastric tube placement. No acute abdominal abnormality.   Original Report Authenticated By: Hulan Saas, M.D.     Dg Abd 1 View 10/30/2012  IMPRESSION: Enteric tube in place.  Contrast seen within colon and rectum. Diverticulosis colon.  Moderate dilatation of a few small intestinal loops similar to previous study.   Original Report Authenticated By: Onalee Hua Call     Scheduled Meds: . antiseptic oral rinse  15 mL Mouth Rinse q12n4p  . chlorhexidine  15 mL Mouth Rinse BID  . cyanocobalamin  1,000 mcg Intramuscular Daily  . insulin aspart  0-9 Units Subcutaneous Q4H  . levothyroxine  50 mcg Intravenous QAC breakfast  . metoCLOPramide (REGLAN) injection  5 mg Intravenous Q6H  . pantoprazole (PROTONIX) IV  40 mg Intravenous Q12H  . sucralfate  1 g Oral QID  . tiotropium  18 mcg Inhalation  Daily   Continuous Infusions: . 0.9 % NaCl with KCl 40 mEq / L 75 mL/hr at 11/02/12 0601    Time spent: 25 minutes.   LOS: 6 days   RAMA,CHRISTINA  [facility] Pager [phone].  If 8PM-8AM, please contact night-coverage at [email or web address], password [facility] 11/02/2012, 7:35 AM

## 2012-11-02 NOTE — Progress Notes (Signed)
Pt with redness at IV site. MD notified. Orders to apply warm compress. Will continue to monitor.

## 2012-11-02 NOTE — Telephone Encounter (Signed)
Daughter agitated, upset Dr Graciela Husbands has not been notified by hospitalist that her mother is a Pt in [facility] for small bowel obstruction and has asked staff several times for her dr's to be called. Explained to her that when they feel cardiology is needed they will place an order for them to be involved. Explained that because her problem is not cardiology in nature we have not been asked yet to be involved. Explained that all her records can be seen. Family member was relieved but continued to state that they may do small bowel surgery, i told her they will probably get a cardiology clearance for that and to ask the Dr there if that will be needed. Family member agitated and hung up.

## 2012-11-02 NOTE — Evaluation (Signed)
Physical Therapy Evaluation Patient Details Name: [name] MRN: [id number] DOB: 03-27-1930 Today's Date: 11/02/2012 Time: 8295-6213 PT Time Calculation (min): 33 min  PT Assessment / Plan / Recommendation Clinical Impression  79 y.o. female with h/oDM, COPD, Diastolic HF, colectomy admitted with SBO. Min assist for bed to chair transfer, activity tolerance limited by fatigue. Pt would benefit from acute PT to maximize safety and independence with mobility. HHPT recommended.     PT Assessment  Patient needs continued PT services    Follow Up Recommendations  Home health PT    Does the patient have the potential to tolerate intense rehabilitation      Barriers to Discharge None      Equipment Recommendations  None recommended by PT    Recommendations for Other Services     Frequency Min 3X/week    Precautions / Restrictions Precautions Precautions: Fall Restrictions Weight Bearing Restrictions: No   Pertinent Vitals/Pain **unrated L hand pain with movement, RN aware, reapplied warm pack*      Mobility  Bed Mobility Bed Mobility: Supine to Sit Supine to Sit: 4: Min assist;HOB elevated Transfers Transfers: Sit to Stand;Stand to Sit;Stand Pivot Transfers Sit to Stand: 4: Min assist Stand to Sit: 4: Min Multimedia programmer Transfers: 4: Min assist Details for Transfer Assistance: SPT with SPC and min assist for balance, pt reported dizziness in standing Ambulation/Gait Ambulation/Gait Assistance: Not tested (comment)    Exercises     PT Diagnosis: Difficulty walking;Generalized weakness;Acute pain  PT Problem List: Decreased strength;Decreased range of motion;Decreased activity tolerance;Decreased mobility;Pain PT Treatment Interventions: Gait training;Functional mobility training;Therapeutic exercise;Therapeutic activities   PT Goals Acute Rehab PT Goals PT Goal Formulation: With patient Time For Goal Achievement: 11/16/12 Potential to Achieve Goals:  Good Pt will go Supine/Side to Sit: with modified independence;with HOB 0 degrees;with rail PT Goal: Supine/Side to Sit - Progress: Goal set today Pt will go Sit to Stand: with supervision PT Goal: Sit to Stand - Progress: Goal set today Pt will Transfer Bed to Chair/Chair to Bed: with supervision PT Transfer Goal: Bed to Chair/Chair to Bed - Progress: Goal set today Pt will Ambulate: 16 - 50 feet;with supervision;with least restrictive assistive device PT Goal: Ambulate - Progress: Goal set today Pt will Go Up / Down Stairs: 3-5 stairs;with rail(s);with min assist PT Goal: Up/Down Stairs - Progress: Goal set today  Visit Information  Last PT Received On: 11/02/12 Assistance Needed: +1    Subjective Data  Subjective: My daughter has been staying with me.  Patient Stated Goal: return to prior level of function   Prior Functioning  Home Living Lives With: Spouse Available Help at Discharge: Family Type of Home: House Home Access: Stairs to enter [employer] of Steps: 4 Entrance Stairs-Rails: Right Home Layout: Two level;Laundry or work area in basement;Able to live on main level with bedroom/bathroom Alternate Teacher, music of Steps: flight Bathroom Shower/Tub: Health visitor: Standard Home Adaptive Equipment: Straight cane;Walker - standard;Shower chair with back;Grab bars in shower Additional Comments: "it's hard to get up and down from the commode, I pull up on the towel" Prior Function Level of Independence: Independent with assistive device(s) Able to Take Stairs?: Yes Driving: No Comments: pt reports "a couple" falls in past year Communication Communication: No difficulties    Cognition  Cognition Overall Cognitive Status: Appears within functional limits for tasks assessed/performed Arousal/Alertness: Awake/alert Orientation Level: Appears intact for tasks assessed Behavior During Session: [facility] for tasks performed  Extremity/Trunk Assessment Right Upper Extremity Assessment RUE ROM/Strength/Tone: Deficits RUE ROM/Strength/Tone Deficits: wound R elbow, shoulder elevation limited to approx 30* AROM Left Upper Extremity Assessment LUE ROM/Strength/Tone: Deficits;Due to pain LUE ROM/Strength/Tone Deficits: dorsum of hand appears inflamed and is very painful, pt guards L hand, doesn't tolerate movement Right Lower Extremity Assessment RLE ROM/Strength/Tone: Deficits RLE ROM/Strength/Tone Deficits: knee ext AROM -30*, knee ext strength +3/5 RLE Sensation: WFL - Light Touch RLE Coordination: WFL - gross/fine motor Left Lower Extremity Assessment LLE ROM/Strength/Tone: Deficits LLE ROM/Strength/Tone Deficits: knee ext AROM -30*, knee ext strength +3/5 LLE Sensation: WFL - Light Touch LLE Coordination: WFL - gross/fine motor Trunk Assessment Trunk Assessment: Normal   Balance    End of Session PT - End of Session Equipment Utilized During Treatment: Gait belt Activity Tolerance: Patient limited by fatigue Patient left: in chair;with call bell/phone within reach Nurse Communication: Mobility status  GP     Ralene Bathe Kistler 11/02/2012, 10:44 AM [phone]

## 2012-11-02 NOTE — Progress Notes (Signed)
Did not attempt PICC placement due to patient unable to position her right  arm away from her body without severe pain  Notified staff RN

## 2012-11-02 NOTE — Progress Notes (Signed)
PARENTERAL NUTRITION CONSULT NOTE - INITIAL  Pharmacy Consult for TNA Indication:Prolonged Ileus, Complex Abdominal History, Unable to Tolerate PO  Allergies  Allergen Reactions  . Ace Inhibitors   . Acetaminophen-Codeine     REACTION: n/v  . Atorvastatin   . Bisoprolol Fumarate   . Ezetimibe-Simvastatin   . Fluvastatin Sodium   . Gabapentin   . Gemfibrozil   . Lisinopril     REACTION: unspecified  . Oxycodone Hcl     REACTION: vomiting  . Rosuvastatin     Patient Measurements: Height: 5\' 4"  (162.6 cm) Weight: 125 lb (56.7 kg) IBW/kg (Calculated) : 54.7 Usual Weight:  Vital Signs: Temp: 97.4 F (36.3 C) (03/17 1249) Temp src: Oral (03/17 1249) BP: 149/66 mmHg (03/17 1249) Pulse Rate: 102 (03/17 1249)  Intake/Output from previous day: 03/16 0701 - 03/17 0700 In: 1839.3 [I.V.:1839.3] Out: 950 [Urine:950] Intake/Output from this shift:    Labs:  Recent Labs  10/31/12 0758 11/02/12 0410  WBC 16.6* 14.6*  HGB 10.5* 10.4*  HCT 33.5* 33.2*  PLT 379 381     Recent Labs  10/31/12 0758 11/02/12 0410  NA 137 134*  K 4.5 4.8  CL 103 100  CO2 22 20  GLUCOSE 95 92  BUN 13 9  CREATININE 0.79 0.75  CALCIUM 8.5 8.6   Estimated Creatinine Clearance: 47.6 ml/min (by C-G formula based on Cr of 0.75).    Recent Labs  11/02/12 0351 11/02/12 0750 11/02/12 1218  GLUCAP 97 92 93    Medical History: Past Medical History  Diagnosis Date  . CONGESTIVE HEART FAILURE 06/03/2007  . COPD 06/03/2007  . HYPERTENSION 06/03/2007  . HYPOTHYROIDISM 06/03/2007  . Hernia   . Asthma   . Diabetes mellitus   . Hyperlipidemia   . Arthritis   . Myocardial infarction   . Osteoporosis     Medications:  Scheduled:  . antiseptic oral rinse  15 mL Mouth Rinse q12n4p  . chlorhexidine  15 mL Mouth Rinse BID  . cyanocobalamin  1,000 mcg Intramuscular Daily  . diltiazem  240 mg Oral Daily  . insulin aspart  0-9 Units Subcutaneous Q4H  . levothyroxine  50 mcg  Intravenous QAC breakfast  . metoCLOPramide (REGLAN) injection  5 mg Intravenous Q6H  . pantoprazole (PROTONIX) IV  40 mg Intravenous Q12H  . sucralfate  1 g Oral QID  . tiotropium  18 mcg Inhalation Daily   Infusions:  . 0.9 % NaCl with KCl 20 mEq / L 75 mL/hr at 11/02/12 1113  . [DISCONTINUED] 0.9 % NaCl with KCl 40 mEq / L 75 mL/hr at 11/02/12 0601    Insulin Requirements in the past 24 hours:  none  Current Nutrition:  CLD NS+20K at 62ml/hr  Nutritional Goals:  (pending RD assessment) Protein: 70-110 g/day Kcal:  1420 - 1980 kca/day Fluid ~ 1.7L/day  Goal TNA: Clinimix E5/15% at 6ml/hr to provide 90g protein/day, avg 1484 kcal/day (1758 kcal on lipid days, 1278 kcal on non-lipid days)  Assessment: 79 yo F with a complex abdominal history (surgeries) admitted 10/28/12 with possible SBO. Patient underwent EGD on 10/30/12 which showed possible gastric ulcerations versus NG trauma, but no GOO.  Hx of DM2 but recent CBGs have mostly been <100. Abdominal study today shows no signs of SBO. Plan is to start TNA today for prolonged ileus and inability to tolerate PO. Lytes essentially wnl.  Plan:  1) Have paged IV team to see if PICC will be placed tonight 2) If PICC can  be placed today, will start Clinimix E 5/15% at 69ml/hr 3) IV lipids at 28ml/hr on MWF only 4) MVI PO (since pt taking diltiazem and carafate PO already) 5) Change SSI to q4h 6) Reduce IVF to 92ml/hr 7) TNA labs tomorrow and Q Mon and Thurs 8) RD consult - f/u recs.   Darrol Angel, PharmD Pager: [phone] 11/02/2012,2:42 PM

## 2012-11-03 DIAGNOSIS — E43 Unspecified severe protein-calorie malnutrition: Secondary | ICD-10-CM

## 2012-11-03 DIAGNOSIS — I319 Disease of pericardium, unspecified: Secondary | ICD-10-CM

## 2012-11-03 DIAGNOSIS — I5031 Acute diastolic (congestive) heart failure: Secondary | ICD-10-CM

## 2012-11-03 LAB — GLUCOSE, CAPILLARY
Glucose-Capillary: 106 mg/dL — ABNORMAL HIGH (ref 70–99)
Glucose-Capillary: 120 mg/dL — ABNORMAL HIGH (ref 70–99)
Glucose-Capillary: 93 mg/dL (ref 70–99)

## 2012-11-03 LAB — COMPREHENSIVE METABOLIC PANEL
ALT: 8 U/L (ref 0–35)
AST: 12 U/L (ref 0–37)
Alkaline Phosphatase: 145 U/L — ABNORMAL HIGH (ref 39–117)
CO2: 24 mEq/L (ref 19–32)
GFR calc Af Amer: 69 mL/min — ABNORMAL LOW (ref >90)
GFR calc non Af Amer: 59 mL/min — ABNORMAL LOW (ref >90)
Glucose, Bld: 99 mg/dL (ref 70–99)
Total Protein: 5.9 g/dL — ABNORMAL LOW (ref 6.0–8.3)

## 2012-11-03 LAB — CBC
Hemoglobin: 10.3 g/dL — ABNORMAL LOW (ref 12.0–15.0)
MCH: 24.5 pg — ABNORMAL LOW (ref 26.0–34.0)
Platelets: 375 10*3/uL (ref 150–400)
RBC: 4.21 MIL/uL (ref 3.87–5.11)
WBC: 14.8 10*3/uL — ABNORMAL HIGH (ref 4.0–10.5)

## 2012-11-03 LAB — DIFFERENTIAL
Eosinophils Absolute: 0.1 10*3/uL (ref 0.0–0.7)
Lymphocytes Relative: 8 % — ABNORMAL LOW (ref 12–46)
Lymphs Abs: 1.2 10*3/uL (ref 0.7–4.0)
Monocytes Relative: 9 % (ref 3–12)
Neutrophils Relative %: 82 % — ABNORMAL HIGH (ref 43–77)

## 2012-11-03 LAB — CHOLESTEROL, TOTAL: Cholesterol: 169 mg/dL (ref 0–200)

## 2012-11-03 MED ORDER — ENSURE COMPLETE PO LIQD
237.0000 mL | Freq: Three times a day (TID) | ORAL | Status: DC
  Administered 2012-11-03: 237 mL via ORAL

## 2012-11-03 MED ORDER — LEVOTHYROXINE SODIUM 112 MCG PO TABS
112.0000 ug | ORAL_TABLET | Freq: Every day | ORAL | Status: DC
  Administered 2012-11-04 – 2012-11-06 (×3): 112 ug via ORAL
  Filled 2012-11-03 (×4): qty 1

## 2012-11-03 MED ORDER — ALIGN PO CAPS
1.0000 | ORAL_CAPSULE | Freq: Every day | ORAL | Status: DC
  Administered 2012-11-03 – 2012-11-06 (×4): 1 via ORAL
  Filled 2012-11-03 (×5): qty 1

## 2012-11-03 MED ORDER — NON FORMULARY: 1.0000 | Freq: Every day | Status: DC

## 2012-11-03 MED ORDER — LEVALBUTEROL TARTRATE 45 MCG/ACT IN AERO
1.0000 | INHALATION_SPRAY | Freq: Four times a day (QID) | RESPIRATORY_TRACT | Status: DC | PRN
  Filled 2012-11-03: qty 15

## 2012-11-03 MED ORDER — BOOST / RESOURCE BREEZE PO LIQD
1.0000 | Freq: Three times a day (TID) | ORAL | Status: DC
  Administered 2012-11-03 – 2012-11-06 (×9): 1 via ORAL

## 2012-11-03 MED ORDER — INSULIN ASPART 100 UNIT/ML ~~LOC~~ SOLN
0.0000 [IU] | SUBCUTANEOUS | Status: DC
  Administered 2012-11-03: 2 [IU] via SUBCUTANEOUS
  Administered 2012-11-03: 3 [IU] via SUBCUTANEOUS
  Administered 2012-11-04: 2 [IU] via SUBCUTANEOUS
  Administered 2012-11-04: 1 [IU] via SUBCUTANEOUS
  Administered 2012-11-04 (×2): 2 [IU] via SUBCUTANEOUS
  Administered 2012-11-05: 1 [IU] via SUBCUTANEOUS
  Administered 2012-11-05: 2 [IU] via SUBCUTANEOUS
  Administered 2012-11-05 (×2): 1 [IU] via SUBCUTANEOUS
  Administered 2012-11-06: 2 [IU] via SUBCUTANEOUS
  Administered 2012-11-06: 1 [IU] via SUBCUTANEOUS
  Filled 2012-11-03: qty 0.09

## 2012-11-03 NOTE — Progress Notes (Addendum)
Clinical Social Work Department CLINICAL SOCIAL WORK PLACEMENT NOTE 11/03/2012  Patient:  ELONNA, MCFARLANE  Account Number:  [id number] Admit date:  10/27/2012  Clinical Social Worker:  Jacelyn Grip  Date/time:  11/03/2012 04:30 PM  Clinical Social Work is seeking post-discharge placement for this patient at the following level of care:   SKILLED NURSING   (*CSW will update this form in Epic as items are completed)   11/03/2012  Patient/family provided with [facility] Department of Clinical Social Work's list of facilities offering this level of care within the geographic area requested by the patient (or if unable, by the patient's family).  11/03/2012  Patient/family informed of their freedom to choose among providers that offer the needed level of care, that participate in Medicare, Medicaid or managed care program needed by the patient, have an available bed and are willing to accept the patient.  11/03/2012  Patient/family informed of [facility]' ownership interest in [facility], as well as of the fact that they are under no obligation to receive care at this facility.  PASARR submitted to EDS on 11/03/2012 PASARR number received from EDS on 11/03/2012  FL2 transmitted to all facilities in geographic area requested by pt/family on  11/03/2012 FL2 transmitted to all facilities within larger geographic area on   Patient informed that his/her managed care company has contracts with or will negotiate with  certain facilities, including the following:     Patient/family informed of bed offers received: 11/04/2012  Patient chooses bed at [facility] Physician recommends and patient chooses bed at    Patient to be transferred to  on  [facility] on 11/06/2012 Patient to be transferred to facility by pt husband via private vehicle  The following physician request were entered in Epic:   Additional Comments:  Jacklynn Lewis, MSW, LCSWA   Clinical Social Work [phone]

## 2012-11-03 NOTE — Progress Notes (Signed)
[facility]  Patient up in chair.  Seen and examined.  Agree with Will Marlyne Beards note.  Will advance diet.  No acute surgical issue.  Will follow one more day if continues to progress slowly.  Velora Heckler, MD, [facility] Office: [phone]

## 2012-11-03 NOTE — Progress Notes (Signed)
   Subjective:  Denies CP or dyspnea   Objective:  Filed Vitals:   11/02/12 1143 11/02/12 1249 11/02/12 2120 11/03/12 0606  BP: 159/84 149/66 170/77 128/62  Pulse:  102 91 89  Temp:  97.4 F (36.3 C) 98.6 F (37 C) 97.6 F (36.4 C)  TempSrc:  Oral Oral Oral  Resp:  16 16 16   Height:      Weight:      SpO2:  97% 97% 95%    Intake/Output from previous day:  Intake/Output Summary (Last 24 hours) at 11/03/12 0753 Last data filed at 11/03/12 0739  Gross per 24 hour  Intake     60 ml  Output    300 ml  Net   -240 ml    Physical Exam: Physical exam: Well-developed well-nourished in no acute distress.  Skin is warm and dry.  HEENT is normal.  Neck is supple.  Chest is clear to auscultation with normal expansion.  Cardiovascular exam is regular rate and rhythm.  Abdominal exam mild tenderness to palpation Extremities show no edema. neuro grossly intact    Lab Results: Basic Metabolic Panel:  Recent Labs  21/30/86 0410 11/03/12 0350  NA 134* 135  K 4.8 3.7  CL 100 98  CO2 20 24  GLUCOSE 92 99  BUN 9 9  CREATININE 0.75 0.89  CALCIUM 8.6 8.6  MG  --  1.6  PHOS  --  3.4   CBC:  Recent Labs  11/02/12 0410 11/03/12 0350  WBC 14.6* 14.8*  NEUTROABS  --  12.1*  HGB 10.4* 10.3*  HCT 33.2* 32.7*  MCV 78.9 77.7*  PLT 381 375     Assessment/Plan:  1 Acute diastolic CHF - s/p lasix x 1; feels better; pt now eating; would DC IVFs. 2 SBO - per surgery 3 BIV ICD - FU Dr Graciela Husbands as scheduled 4 HTN - continue cardizem at DC  We will sign off. Please call with questions.   Olga Millers 11/03/2012, 7:53 AM

## 2012-11-03 NOTE — Progress Notes (Signed)
Physical Therapy Treatment Patient Details Name: [name] MRN: [id number] DOB: 1930-02-19 Today's Date: 11/03/2012 Time: 6578-4696 PT Time Calculation (min): 17 min  PT Assessment / Plan / Recommendation Comments on Treatment Session  Pt is very weak, has pain in L hand SO DIFFICULTY WITH RW YET PT IS VERY UNSTABLE WITH  CANE. WILL GIVE TRIAL OF RW and consider a platform. Recommend SNF.    Follow Up Recommendations  SNF     Does the patient have the potential to tolerate intense rehabilitation     Barriers to Discharge        Equipment Recommendations  None recommended by PT    Recommendations for Other Services    Frequency Min 3X/week   Plan Discharge plan needs to be updated    Precautions / Restrictions Precautions Precautions: Fall   Pertinent Vitals/Pain L hand is painfurl    Mobility  Transfers Sit to Stand: 3: Mod assist;From chair/3-in-1 Stand to Sit: To chair/3-in-1;4: Min assist;With upper extremity assist Ambulation/Gait Ambulation/Gait Assistance: 1: +2 Total assist Ambulation/Gait: Patient Percentage: 60% Ambulation Distance (Feet): 30 Feet Assistive device: Straight cane Ambulation/Gait Assistance Details: pt, repeatedly would kick cane on R due to not placing cane away from body. Pt. required assistance at pelvis to facilitate weight shifting and for balance support. Gait Pattern: Step-through pattern;Decreased stride length;Antalgic;Lateral hip instability;Decreased trunk rotation;Narrow base of support Gait velocity: decr.    Exercises     PT Diagnosis:    PT Problem List:   PT Treatment Interventions:     PT Goals Acute Rehab PT Goals Pt will go Sit to Stand: with supervision PT Goal: Sit to Stand - Progress: Progressing toward goal Pt will Ambulate: 16 - 50 feet;with supervision;with least restrictive assistive device PT Goal: Ambulate - Progress: Progressing toward goal  Visit Information  Last PT Received On:  11/03/12 Assistance Needed: +2    Subjective Data  Subjective: I use my cane. My husband won't like me to go to a rehab.   Cognition  Cognition Overall Cognitive Status: Impaired Area of Impairment: Safety/judgement;Awareness of deficits;Awareness of errors    Balance  Balance Balance Assessed: Yes Static Standing Balance Static Standing - Balance Support: Left upper extremity supported Static Standing - Level of Assistance: 3: Mod assist Static Standing - Comment/# of Minutes: standing with R elbow support.  End of Session PT - End of Session Equipment Utilized During Treatment: Gait belt Activity Tolerance: Patient limited by fatigue Patient left: in chair;with call bell/phone within reach Nurse Communication: Mobility status   GP     Rada Hay 11/03/2012, 11:01 AM [phone]

## 2012-11-03 NOTE — Progress Notes (Signed)
Clinical Social Work Department BRIEF PSYCHOSOCIAL ASSESSMENT 11/03/2012  Patient:  [name], [name]     Account Number:  [id number]     Admit date:  10/27/2012  Clinical Social Worker:  Jacelyn Grip  Date/Time:  11/03/2012 02:45 PM  Referred by:  Care Management  Date Referred:  11/03/2012 Referred for  SNF Placement   Other Referral:   Interview type:  Patient Other interview type:   and patient spouse at bedside; spoke with pt daughter via telephone with pt permission    PSYCHOSOCIAL DATA Living Status:  HUSBAND Admitted from facility:   Level of care:   Primary support name:  Diana/daughter Primary support relationship to patient:  CHILD, ADULT Degree of support available:   strong    CURRENT CONCERNS Current Concerns  Post-Acute Placement   Other Concerns:    SOCIAL WORK ASSESSMENT / PLAN CSW received notification from [facility] that PT now recommending SNF placement for pt.    CSW met with pt and pt spouse at bedside re: disposition planning. CSW discussed recommendation for rehab at SNF and clarified pt and pt spouse questions. Pt interested in [facility] and agreeable to information being faxed to [facility]. Per RN, pt currently not on TNA as MD is seeing how pt tolerates diet. CSW discussed with pt that depending on outcome of toleration of diet may change options for SNF if pt does have to d/c on TNA.    CSW spoke with pt daughter via telephone at pt request and updated on the above information.    CSW completed FL2 and initiated SNF search to [facility].    CSW to follow up with pt and pt family re: bed offers and follow up on the status of pt toleration of diet.    CSW to continue to follow to assist with pt disposition needs.   Assessment/plan status:  Psychosocial Support/Ongoing Assessment of Needs Other assessment/ plan:   discharge planning   Information/referral to community resources:   [facility] list     PATIENT'S/FAMILY'S RESPONSE TO PLAN OF CARE: Pt alert and oriented x 4. Pt feels that rehab would be appropriate as it is only she and her husband at her home. Pt and pt family are hopeful that pt will be able to tolerate diet as this give pt a better chance to get into [facility].      Jacklynn Lewis, MSW, LCSWA  Clinical Social Work [phone]

## 2012-11-03 NOTE — Progress Notes (Signed)
[facility] PROGRESS NOTE  [name]:[id number] DOB: May 16, 1930 DOA: 10/27/2012 PCP: Nadean Corwin, MD  Brief narrative: [name] is an 79 y.o. female with a past medical history of hypothyroidism, chronic diastolic heart failure, type 2 diabetes and an incisional hernia who was admitted 10/28/2012 with possible small bowel obstruction versus enteritis. General surgery was consulted and has been following the patient along. She was initially treated with NG tube gastric decompression but this was stopped when it was evident that contrast had moved into the bowel and there was no evidence of ongoing obstruction or Serial radiographs showed gastric distention versus partial versus partial small bowel obstruction, underwent EGD for further evaluation on 10/30/12 which showed possible gastric ulcerations versus NG trauma, but no GOO. On 11/02/2012, the patient became more short of breath, and given her history of heart failure, cardiology was consulted. She has responded nicely to a dose of Lasix and seems improved today.  Assessment/Plan: Principal Problem:   Small bowel obstruction with abdominal pain / rule out gastric outlet obstruction -Continue conservative treatment.  NG tube out.. -Patient was evaluated by Dr. Randa Evens on 10/29/2012, underwent endoscopy 10/30/12 to rule out gastric outlet obstruction or a obstruction in the duodenum. Endoscopy showed possible ulcer versus NG tube trauma. No gastric outlet obstruction. Biopsies negative for H. pylori and malignancy. -Findings are consistent with partial small bowel obstruction, as contrast has advanced into the colon. -Continue PPI therapy, Carafate and Reglan.  NG tube remains out with no documented nausea or vomiting. -Diet advanced to full liquids, if unable to tolerate further, will need to consider TNA.  (Patient unable to tolerate PICC placement 11/02/12). -Touched base with Dr. Randa Evens who tells me that she  has had chronic issues with abdominal pain, likely from adhesions, and he does not think the findings of possible enteritis on CT scan reflect an infectious process, and therefore does not recommend empiric antibiotics at this time. Active Problems:   Protein calorie malnutrition in the context of acute illness -Patient has not had any significant intake in over a week. -TNA ordered 11/02/2012 PICC line could not be placed. -With improvement in symptoms today, would hold off on placement and TNA, and see if she can tolerate full liquids with diet advancement over the next 24 hours.   Hypertension -Continue oral diltiazem.  Has hydralazine ordered as needed.   Metabolic acidosis -Resolved. Lactic acid checked and found to be within normal limits.   Hypokalemia -Corrected with potassium added to IV fluids.    HYPOTHYROIDISM -Resume home dose of oral Synthroid.   CARDIOMYOPATHY, resolved / acute on chronic diastolic heart failure / status post pacemaker -Normal left ventricular ejection fraction and grade 1 diastolic dysfunction noted on echocardiography 07/2010. -Developed dyspnea on 11/02/2012 prompting a cardiology consultation. Lasix given with good results appear -2-D echocardiogram repeated 11/03/2012. Results pending.   COPD -Stable.Continue Spiriva. -Continue bronchodilators as needed.   Incisional hernia -No evidence of incarceration. Continue to monitor.   Anemia, multifactorial, secondary to B12 deficiency and anemia of chronic disease -Normocytic. Likely secondary to B12 deficiency and anemia of chronic disease. B12 levels 185. Folic acid levels greater than 20. Normal ferritin.  Iron low at less than 10. Urine iron binding capacity 219. This is consistent with anemia of chronic disease.  -Vitamin B12 supplementation initiated (1,000 mcg daily x 1 week). -Fecal occult blood testing was negative. -H&H stable.   Diabetes mellitus type 2 in nonobese -Continue sliding scale insulin.  Glyburide on hold. -  CBG is 89-127.   Code Status: Full. Family Communication: No one at bedside this morning. Husband and daughter updated by telephone 11/02/2012. Disposition Plan: Home when stable.   Medical Consultants:  Dr. Lodema Pilot, Surgery  Dr. Carman Ching, Gastroenterology  Dr. Charlton Haws, Cardiology  Other Consultants:  None.  Anti-infectives:  None.  HPI/Subjective: [name] is currently getting her echocardiogram done. She seems more awake and alert, with less abdominal pain and discomfort. She does report passing flatus. Yesterday, the IV team tried to place a PICC line but she had too much pain for appropriate positioning of her right upper extremity. X-rays were done to rule out fracture.  Objective: Filed Vitals:   11/02/12 1249 11/02/12 2120 11/03/12 0606 11/03/12 0919  BP: 149/66 170/77 128/62   Pulse: 102 91 89   Temp: 97.4 F (36.3 C) 98.6 F (37 C) 97.6 F (36.4 C)   TempSrc: Oral Oral Oral   Resp: 16 16 16    Height:      Weight:      SpO2: 97% 97% 95% 91%    Intake/Output Summary (Last 24 hours) at 11/03/12 0949 Last data filed at 11/03/12 0739  Gross per 24 hour  Intake     60 ml  Output    300 ml  Net   -240 ml    Exam: Gen:  NAD Cardiovascular:  RRR, No M/R/G Respiratory:  Lungs CTAB Gastrointestinal:  Abdomen less distended and tender, + BS Extremities:  Ecchymosis to the left hand and forearm. Right elbow has a skin tear, but is otherwise without swelling. Mobility of the right upper extremity limited by pain.  Data Reviewed: Basic Metabolic Panel:  Recent Labs Lab 10/29/12 0415 10/30/12 0545 10/31/12 0758 11/02/12 0410 11/03/12 0350  NA 137 140 137 134* 135  K 4.3 4.7 4.5 4.8 3.7  CL 103 104 103 100 98  CO2 18* 19 22 20 24   GLUCOSE 105* 109* 95 92 99  BUN 12 13 13 9 9   CREATININE 0.94 0.83 0.79 0.75 0.89  CALCIUM 8.5 8.7 8.5 8.6 8.6  MG  --   --   --   --  1.6  PHOS  --   --   --   --  3.4    GFR Estimated Creatinine Clearance: 42.8 ml/min (by C-G formula based on Cr of 0.89). Liver Function Tests:  Recent Labs Lab 10/27/12 2040 11/03/12 0350  AST 13 12  ALT 12 8  ALKPHOS 133* 145*  BILITOT 0.2* 0.3  PROT 7.0 5.9*  ALBUMIN 2.5* 1.9*    Recent Labs Lab 10/27/12 2040  LIPASE 21   CBC:  Recent Labs Lab 10/27/12 2040 10/28/12 0415 10/29/12 0415 10/31/12 0758 11/02/12 0410 11/03/12 0350  WBC 16.4* 17.3* 16.5* 16.6* 14.6* 14.8*  NEUTROABS 13.3*  --   --   --   --  12.1*  HGB 9.9* 10.6* 10.9* 10.5* 10.4* 10.3*  HCT 31.3* 34.0* 35.2* 33.5* 33.2* 32.7*  MCV 79.0 80.6 80.0 79.6 78.9 77.7*  PLT 348 394 345 379 381 375   CBG:  Recent Labs Lab 11/02/12 1651 11/02/12 2026 11/03/12 11/03/12 0348 11/03/12 0736  GLUCAP 98 127* 89 93 82    Procedures and Diagnostic Studies:  Ct Abdomen Pelvis W Contrast 10/27/2012   IMPRESSION: Segment of jejunum with perivisceral fat stranding and fluid. Most in keeping with a nonspecific enteritis (infectious, inflammatory, and ischemic considerations).  The more distal small bowel loops are decompressed.  This is  favored to be due to delayed transit/ileus.  However, KUB follow-up to document passage of ingested contrast into the colon is recommended to exclude early obstruction.  Anterior abdominal wall hernia containing the anterior wall of a segment of transverse colon.  No CT evidence for incarceration or obstruction.  Heterogeneous appearance to the bones is nonspecific.  May be secondary to osteopenia or renal osteodystrophy. An infiltrative marrow process (such as myeloma or lymphoma) is not excluded however not favored given the stability.   Original Report Authenticated By: Jearld Lesch, M.D.     Dg Abd 1 View 10/28/2012 IMPRESSION:  1.  Progression of enteric contrast material into the colon. 2.  Gaseous distention of the stomach.   Original Report Authenticated By: Signa Kell, M.D.     Dg Abd 1 View 10/29/2012  IMPRESSION: Complete gastric decompression after nasogastric tube placement. No acute abdominal abnormality.   Original Report Authenticated By: Hulan Saas, M.D.     Dg Abd 1 View 10/30/2012  IMPRESSION: Enteric tube in place.  Contrast seen within colon and rectum. Diverticulosis colon.  Moderate dilatation of a few small intestinal loops similar to previous study.   Original Report Authenticated By: Onalee Hua Call     Scheduled Meds: . antiseptic oral rinse  15 mL Mouth Rinse q12n4p  . chlorhexidine  15 mL Mouth Rinse BID  . cyanocobalamin  1,000 mcg Intramuscular Daily  . diltiazem  240 mg Oral Daily  . insulin aspart  0-9 Units Subcutaneous Q4H  . levothyroxine  50 mcg Intravenous QAC breakfast  . metoCLOPramide (REGLAN) injection  5 mg Intravenous Q6H  . multivitamin with minerals  1 tablet Oral Daily  . pantoprazole (PROTONIX) IV  40 mg Intravenous Q12H  . sucralfate  1 g Oral QID  . tiotropium  18 mcg Inhalation Daily   Continuous Infusions: . 0.9 % NaCl with KCl 20 mEq / L 75 mL/hr at 11/02/12 1900    Time spent: 25 minutes.   LOS: 7 days   Bartow Zylstra  [facility] Pager [phone].  If 8PM-8AM, please contact night-coverage at [email or web address], password [facility] 11/03/2012, 9:49 AM

## 2012-11-03 NOTE — Plan of Care (Signed)
Problem: Phase II Progression Outcomes Goal: Progress activity as tolerated unless otherwise ordered Outcome: Not Progressing Pt is 2x assist when getting up. Increasing pt's time sitting up and ambulating with pt to encourage improved strength/mobility.

## 2012-11-03 NOTE — Progress Notes (Signed)
4 Days Post-Op  Subjective: In bed looks chronically ill, and fragile, no real complaints, it doesn't sound like she's been out of bed except to the bathroom. No nausea with clears, passing gas, she thinks she's had a BM but isn't sure.  Objective: Vital signs in last 24 hours: Temp:  [97.4 F (36.3 C)-98.6 F (37 C)] 97.6 F (36.4 C) (03/18 0606) Pulse Rate:  [89-102] 89 (03/18 0606) Resp:  [16] 16 (03/18 0606) BP: (128-170)/(62-84) 128/62 mmHg (03/18 0606) SpO2:  [91 %-97 %] 91 % (03/18 0919) Last BM Date: 10/31/12 I/o list 60 ml PO and nothing else.BP up some, labs stable, no real change  Intake/Output from previous day: 03/17 0701 - 03/18 0700 In: 60 [P.O.:60] Out: -  Intake/Output this shift: Total I/O In: -  Out: 300 [Urine:300]  General appearance: alert, cooperative, no distress and frail, chronically ill. Resp: ronchi and productive cough. GI: soft, non-tender; bowel sounds normal; no masses,  no organomegaly and ventral hernia's stable.  Lab Results:   Recent Labs  11/02/12 0410 11/03/12 0350  WBC 14.6* 14.8*  HGB 10.4* 10.3*  HCT 33.2* 32.7*  PLT 381 375    BMET  Recent Labs  11/02/12 0410 11/03/12 0350  NA 134* 135  K 4.8 3.7  CL 100 98  CO2 20 24  GLUCOSE 92 99  BUN 9 9  CREATININE 0.75 0.89  CALCIUM 8.6 8.6   PT/INR No results found for this basename: LABPROT, INR,  in the last 72 hours   Recent Labs Lab 10/27/12 2040 11/03/12 0350  AST 13 12  ALT 12 8  ALKPHOS 133* 145*  BILITOT 0.2* 0.3  PROT 7.0 5.9*  ALBUMIN 2.5* 1.9*     Lipase     Component Value Date/Time   LIPASE 21 10/27/2012 2040     Studies/Results: Dg Chest 1 View  11/02/2012  **ADDENDUM** CREATED: 11/02/2012 16:40:08  Please disregard in the original report.  Due to a PACS error, the wrong images (from a different patient) were interpreted.  This has been corrected and the correct study is now loaded on PACS.  The correct report is as follows:  *RADIOLOGY  REPORT*  Clinical Data: Congestion  CHEST - 1 VIEW  Comparison:  11/05/2010  Findings: Chronic interstitial markings/bronchitic changes.  No focal consolidation. No pleural effusion or pneumothorax.  The heart is normal in size.  Left subclavian ICD.  IMPRESSION: No evidence of acute cardiopulmonary disease.  **END ADDENDUM** SIGNED BY: Charline Bills, M.D.   11/02/2012  *RADIOLOGY REPORT*  Clinical Data: Chest congestion, cough  CHEST - 1 VIEW  Comparison: 11/05/2010  Findings: Lungs are clear.  No pleural effusion or pneumothorax.  The heart is top normal in size.  Postsurgical changes related to prior CABG.  Mild degenerative changes of the thoracic spine.  Multiple bilateral rib fracture deformities.  IMPRESSION: No evidence of acute cardiopulmonary disease.   Original Report Authenticated By: Charline Bills, M.D.    Dg Forearm Right  11/02/2012  *RADIOLOGY REPORT*  Clinical Data: Pain, no injury  RIGHT FOREARM - 2 VIEW  Comparison: None.  Findings: Negative for fracture.  No focal bony lesion is present.  IMPRESSION: Negative   Original Report Authenticated By: Janeece Riggers, M.D.    Dg Abd 1 View  11/02/2012  *RADIOLOGY REPORT*  Clinical Data: Small bowel obstruction.  Abdominal pain  ABDOMEN - 1 VIEW  Comparison: 10/30/2012, 10/27/2012  Findings: There is gas throughout the colon extending to the rectum.  The colon is nondilated.  Small bowel is not dilated. There is contrast in the colon from recent CT.  IMPRESSION: Mild colonic distention.  Negative for small-bowel obstruction.   Original Report Authenticated By: Janeece Riggers, M.D.    Dg Humerus Right  11/02/2012  *RADIOLOGY REPORT*  Clinical Data: No known injury  RIGHT HUMERUS - 2+ VIEW  Comparison: 11/24/2009  Findings: Negative for fracture.  No focal bony lesion is present.  IMPRESSION: Negative   Original Report Authenticated By: Janeece Riggers, M.D.     Medications: . antiseptic oral rinse  15 mL Mouth Rinse q12n4p  . chlorhexidine  15  mL Mouth Rinse BID  . cyanocobalamin  1,000 mcg Intramuscular Daily  . diltiazem  240 mg Oral Daily  . insulin aspart  0-9 Units Subcutaneous Q4H  . levothyroxine  50 mcg Intravenous QAC breakfast  . metoCLOPramide (REGLAN) injection  5 mg Intravenous Q6H  . multivitamin with minerals  1 tablet Oral Daily  . pantoprazole (PROTONIX) IV  40 mg Intravenous Q12H  . sucralfate  1 g Oral QID  . tiotropium  18 mcg Inhalation Daily    Assessment/Plan Abdominal pain - presumed gastric/duodenal ulcer. Medical Rx started.  SBO - resolved. AXR today with only slight colonic distension. NG out. Taking po clear liquids.  Incisional hernia - soft, non-tender, reducible. Not an issue presently.  MULTIPLE surgeries in the past  Chronic constipation  CAD/PTVP/AICD/PRIOR MI/CHF  AODM  COPD  HYPERTENSION  HYPOTHYROID  ARTHRITIS   Plan:  Advance her diet, add IS, she needs to be mobilized and walking in the halls.       LOS: 7 days    Saturnino Liew 11/03/2012

## 2012-11-03 NOTE — Progress Notes (Signed)
GASTROENTEROLOGY PROGRESS NOTE  Problem:   Abdominal pain and food intolerance.  Subjective: Able to tolerate small amounts of jello.  No current nausea.  Objective: Abdomen slightly distended, moderate diffuse tympany, mild subjective tenderness.  Potassium 3.7 today, has been above four previously.  KUB from yesterday shows combined small bowel and large bowel gas without overt dilatation.  Assessment: I think this patient probably has an element of chronic low-grade small bowel obstruction from adhesions, with currently an element of colonic atony and gas retention.  Plan: 1. Spoke with the hospitalist. She is agreeable to try frequent small aliquots of full liquids. I will order that. 2. Probiotics to possibly reduce intestinal gas. 3. I spoke with the patient's daughter on the telephone Lafonda Mosses, cell [phone]; work [phone]). I have discussed the above plan with her and she is agreeable.  Florencia Reasons, M.D. 11/03/2012 12:09 PM

## 2012-11-03 NOTE — Evaluation (Signed)
Occupational Therapy Evaluation Patient Details Name: [name] MRN: [id number] DOB: 1930-03-27 Today's Date: 11/03/2012 Time: 4098-1191 OT Time Calculation (min): 37 min  OT Assessment / Plan / Recommendation Clinical Impression  This 79 year old female was admitted for SBO.  She has a h/o CHF and was had RUE pain.  Xrays - .  Pt will benefit from skilled OT to increase safety/independence with adls with min A level goals in acute.      OT Assessment  Patient needs continued OT Services    Follow Up Recommendations  SNF    Barriers to Discharge      Equipment Recommendations   (possibly 3:1--to be further assessed)    Recommendations for Other Services    Frequency  Min 2X/week    Precautions / Restrictions Precautions Precautions: Fall Restrictions Weight Bearing Restrictions: No   Pertinent Vitals/Pain Pain in Bil hands, R shoulder reported.      ADL  Eating/Feeding:  (on clear liquid diet:  very little eaten) Grooming: Moderate assistance (able to wash face not do hair  see comments at end ) Where Assessed - Grooming: Unsupported sitting Upper Body Bathing: Minimal assistance--held cloth with extended hands; unable to wring Where Assessed - Upper Body Bathing: Unsupported sitting Lower Body Bathing: Maximal assistance Where Assessed - Lower Body Bathing: Supported sit to stand Upper Body Dressing: Moderate assistance Where Assessed - Upper Body Dressing: Unsupported sitting Lower Body Dressing: +1 Total assistance Where Assessed - Lower Body Dressing: Supported sit to Pharmacist, hospital: Moderate assistance Toilet Transfer Method: Stand pivot Toilet Transfer Equipment: Bedside commode Toileting - Clothing Manipulation and Hygiene: Moderate assistance Where Assessed - Toileting Clothing Manipulation and Hygiene: Standing Equipment Used: Cane Transfers/Ambulation Related to ADLs: +2 for safety with ambulation. Pt uses a straight cane but doesn't  sequence well--left way behind her and stepped into it with her foot causing a loss of balance.    Overlapped with PT ADL Comments: Pt has pain in RUE from IV site:  tending to keep fingers extended as much as possible:  later in session pt was able to hold cane.  IV infiltrated in LUE yesterday:  fingers swollen and pt is not flexing them.  This greatly impacts ADLs.      OT Diagnosis: Generalized weakness  OT Problem List: Decreased strength;Decreased range of motion;Decreased activity tolerance;Impaired balance (sitting and/or standing);Decreased safety awareness;Impaired UE functional use;Pain OT Treatment Interventions: Self-care/ADL training;Therapeutic exercise;Therapeutic activities;Patient/family education;Balance training   OT Goals Acute Rehab OT Goals OT Goal Formulation: With patient Time For Goal Achievement: 11/17/12 Potential to Achieve Goals: Good ADL Goals Pt Will Perform Grooming: with min assist;Sitting, edge of bed ADL Goal: Grooming - Progress: Goal set today Pt Will Perform Upper Body Bathing: with supervision;Sitting, edge of bed ADL Goal: Upper Body Bathing - Progress: Goal set today Pt Will Perform Lower Body Bathing: with min assist;Sit to stand from chair ADL Goal: Lower Body Bathing - Progress: Goal set today Pt Will Perform Upper Body Dressing: with min assist;Sitting, bed ADL Goal: Upper Body Dressing - Progress: Goal set today Pt Will Perform Lower Body Dressing: with mod assist;Sit to stand from bed ADL Goal: Lower Body Dressing - Progress: Goal set today Pt Will Transfer to Toilet: with min assist;Stand pivot transfer;3-in-1 ADL Goal: Toilet Transfer - Progress: Goal set today Pt Will Perform Toileting - Hygiene: with min assist;Sit to stand from 3-in-1/toilet ADL Goal: Toileting - Hygiene - Progress: Goal set today Miscellaneous OT Goals Miscellaneous OT Goal #  1: Pt will perform gentle arom exercises to L hand (possibly in warm water) to encourage edema  management and to encourage use OT Goal: Miscellaneous Goal #1 - Progress: Goal set today Miscellaneous OT Goal #2: Pt will not need more than 1 vcs for safety during OT session OT Goal: Miscellaneous Goal #2 - Progress: Goal set today  Visit Information  Last OT Received On: 11/03/12 Assistance Needed: +2    Subjective Data      Prior Functioning     Home Living Home Adaptive Equipment: Straight cane;Walker - standard;Shower chair with back;Grab bars in shower Additional Comments: states she has grab bar by toilet.  had told PT a towel rack Prior Function Level of Independence: Independent with assistive device(s) Driving: No Communication Communication: No difficulties Dominant Hand: Right         Vision/Perception     Cognition  Cognition Overall Cognitive Status: Impaired Area of Impairment: Safety/judgement;Awareness of deficits;Awareness of errors Arousal/Alertness: Awake/alert Orientation Level: Appears intact for tasks assessed Behavior During Session: [facility] for tasks performed    Extremity/Trunk Assessment Right Upper Extremity Assessment RUE ROM/Strength/Tone: Deficits;Due to pain RUE ROM/Strength/Tone Deficits: painful shoulder--able to lift about 45 degrees without pain.  Elbow could not fully extend and has a wound.  IV site has had some bleeding and is very sore limiting hand/wrist.  Pt tends to keep fingers extended. Left Upper Extremity Assessment LUE ROM/Strength/Tone: Deficits;Due to pain LUE ROM/Strength/Tone Deficits: Proximal movement good to 90 degrees.  Pt had IV infiltrate per nursing.  Dorsum inflammed and discolored.  Pt unable to flex fingers.       Mobility Bed Mobility Supine to Sit: 4: Min assist;HOB elevated Transfers Sit to Stand: 3: Mod assist;From chair/3-in-1 Stand to Sit: To chair/3-in-1;4: Min assist;With upper extremity assist     Exercise     Balance Balance Balance Assessed: Yes Static Standing Balance Static Standing  - Balance Support: Left upper extremity supported Static Standing - Level of Assistance: 3: Mod assist Static Standing - Comment/# of Minutes: standing with R elbow support.   End of Session OT - End of Session Activity Tolerance: Patient limited by fatigue Patient left: with call bell/phone within reach;in chair Nurse Communication:  (leaving pt up in chair)  GO     Lachell Rochette 11/03/2012, 11:16 AM Marica Otter, OTR/L [phone] 11/03/2012

## 2012-11-03 NOTE — Progress Notes (Signed)
INITIAL NUTRITION ASSESSMENT  DOCUMENTATION CODES Per approved criteria  -Not Applicable   INTERVENTION: - Changed diet to lactose free per pt request - Resource Breeze TID mixed with Ginger ale - No specific food preferences, pt states that any foods, once they hit her stomach, cause pain which escalates into severe pain - Recommend SLP evaluation as pt c/o dysphagia - Pt day 7 of inadequate oral intake, recommend GI discuss nutrition support options with pt as stomach pain limiting adequate PO intake - Will continue to monitor   NUTRITION DIAGNOSIS: Inadequate oral intake related to stomach pain as evidenced by <25% meal intake.   Goal: 1. Resolution of pain after eating 2. Pt to consume >90% of meals/supplements  Monitor:  Weights, labs, intake, stomach pain, BM  Reason for Assessment: Consult  79 y.o. female  Admitting Dx: Small bowel obstruction  ASSESSMENT: Pt admitted with c/o of abdominal distention and pain x 1 week. Pt reported nausea/vomiting/diarrhea. Met with pt who denies any nausea/vomiting today. EGD on 3/14 showed possible small bowel obstruction. Pt reports for the past 2-3 weeks she has had her normal breakfast, gingerale and crackers for lunch, and then baked potato and a boiled egg for dinner. Pt reports during this time frame she has felt a "choking" sensation with everything she tries to eat. Pt reports 5 pound unintended weight loss in the past 2-3 weeks. Pt reports anytime she tries to eat something she has very mild stomach pain at a score of 4-5 and then it gets stronger. Pt reports stomach pain is worse when she eats products that contain lactose. Noted pt had only consumed 1/2 Magic Cup ice cream supplement on lunch tray and reports she only had jello for breakfast. Pt agreeable to trying [employer]. It is not likely she will have adequate PO intake anytime soon.   Height: Ht Readings from Last 1 Encounters:  10/28/12 5\' 4"  (1.626 m)     Weight: Wt Readings from Last 1 Encounters:  10/28/12 125 lb (56.7 kg)    Ideal Body Weight: 120 lb  % Ideal Body Weight: 104  Wt Readings from Last 10 Encounters:  10/28/12 125 lb (56.7 kg)  10/28/12 125 lb (56.7 kg)  08/24/12 133 lb 6.4 oz (60.51 kg)  05/07/12 129 lb 12.8 oz (58.877 kg)  02/10/12 130 lb (58.968 kg)  09/17/11 126 lb (57.153 kg)  03/19/11 131 lb (59.421 kg)  02/13/11 133 lb 12.8 oz (60.691 kg)  11/28/10 133 lb 12.8 oz (60.691 kg)  11/05/10 131 lb 8 oz (59.648 kg)    Usual Body Weight: 133 lb in January 2014  % Usual Body Weight: 94  BMI:  Body mass index is 21.45 kg/(m^2).  Estimated Nutritional Needs: Kcal: 1425-1600 Protein: 60-70g Fluid: 1.4-1.6L/day  Skin: Intact   Diet Order: Full Liquid  EDUCATION NEEDS: -No education needs identified at this time   Intake/Output Summary (Last 24 hours) at 11/03/12 1500 Last data filed at 11/03/12 0900  Gross per 24 hour  Intake    120 ml  Output    300 ml  Net   -180 ml    Last BM: 3/15, abdominal distention  Labs:   Recent Labs Lab 10/31/12 0758 11/02/12 0410 11/03/12 0350  NA 137 134* 135  K 4.5 4.8 3.7  CL 103 100 98  CO2 22 20 24   BUN 13 9 9   CREATININE 0.79 0.75 0.89  CALCIUM 8.5 8.6 8.6  MG  --   --  1.6  PHOS  --   --  3.4  GLUCOSE 95 92 99    CBG (last 3)   Recent Labs  11/03/12 11/03/12 0348 11/03/12 0736  GLUCAP 89 93 82    Scheduled Meds: . antiseptic oral rinse  15 mL Mouth Rinse q12n4p  . bifidobacterium infantis  1 capsule Oral Daily  . chlorhexidine  15 mL Mouth Rinse BID  . cyanocobalamin  1,000 mcg Intramuscular Daily  . diltiazem  240 mg Oral Daily  . feeding supplement  237 mL Oral TID BM  . insulin aspart  0-9 Units Subcutaneous Q4H  . [START ON 11/04/2012] levothyroxine  112 mcg Oral QAC breakfast  . metoCLOPramide (REGLAN) injection  5 mg Intravenous Q6H  . multivitamin with minerals  1 tablet Oral Daily  . pantoprazole (PROTONIX) IV  40 mg  Intravenous Q12H  . sucralfate  1 g Oral QID  . tiotropium  18 mcg Inhalation Daily    Continuous Infusions: . 0.9 % NaCl with KCl 20 mEq / L 10 mL/hr at 11/03/12 1105    Past Medical History  Diagnosis Date  . CONGESTIVE HEART FAILURE 06/03/2007  . COPD 06/03/2007  . HYPERTENSION 06/03/2007  . HYPOTHYROIDISM 06/03/2007  . Hernia   . Asthma   . Diabetes mellitus   . Hyperlipidemia   . Arthritis   . Myocardial infarction   . Osteoporosis     Past Surgical History  Procedure Laterality Date  . Cardiac defibrillator placement    . Appendectomy    . Hysterectomy - unknown type    . Posterior fusion clivus-c2 transarticular stealth guided w/ icbg    . Cystectomy      elbow  . Icd      medtronic maximo  . Polypectomy      bladder  . Esophagogastroduodenoscopy N/A 10/30/2012    Procedure: ESOPHAGOGASTRODUODENOSCOPY (EGD);  Surgeon: Vertell Novak., MD;  Location: [facility];  Service: Endoscopy;  Laterality: N/A;     Levon Hedger MS, RD, LDN [phone] Pager [phone] After Hours Pager

## 2012-11-04 DIAGNOSIS — I4891 Unspecified atrial fibrillation: Secondary | ICD-10-CM

## 2012-11-04 LAB — CBC
MCH: 24.6 pg — ABNORMAL LOW (ref 26.0–34.0)
MCHC: 31.6 g/dL (ref 30.0–36.0)
MCV: 77.7 fL — ABNORMAL LOW (ref 78.0–100.0)
Platelets: 383 10*3/uL (ref 150–400)
RBC: 3.99 MIL/uL (ref 3.87–5.11)

## 2012-11-04 LAB — BASIC METABOLIC PANEL
CO2: 25 mEq/L (ref 19–32)
Calcium: 8.5 mg/dL (ref 8.4–10.5)
Creatinine, Ser: 0.86 mg/dL (ref 0.50–1.10)
GFR calc non Af Amer: 62 mL/min — ABNORMAL LOW (ref >90)

## 2012-11-04 LAB — GLUCOSE, CAPILLARY
Glucose-Capillary: 136 mg/dL — ABNORMAL HIGH (ref 70–99)
Glucose-Capillary: 109 mg/dL — ABNORMAL HIGH (ref 70–99)
Glucose-Capillary: 158 mg/dL — ABNORMAL HIGH (ref 70–99)

## 2012-11-04 MED ORDER — POTASSIUM CHLORIDE 10 MEQ/100ML IV SOLN
10.0000 meq | INTRAVENOUS | Status: DC
  Filled 2012-11-04 (×3): qty 100

## 2012-11-04 MED ORDER — POTASSIUM CHLORIDE 20 MEQ/15ML (10%) PO LIQD
30.0000 meq | Freq: Once | ORAL | Status: AC
  Administered 2012-11-04: 30 meq via ORAL
  Filled 2012-11-04 (×2): qty 22.5

## 2012-11-04 NOTE — Telephone Encounter (Signed)
Dr klein made aware. 

## 2012-11-04 NOTE — Evaluation (Addendum)
Clinical/Bedside Swallow Evaluation Patient Details  Name: [name] MRN: [id number] Date of Birth: Feb 01, 1930  Today's Date: 11/04/2012 Time: 9147-8295 SLP Time Calculation (min): 56 min  Past Medical History:  Past Medical History  Diagnosis Date  . CONGESTIVE HEART FAILURE 06/03/2007  . COPD 06/03/2007  . HYPERTENSION 06/03/2007  . HYPOTHYROIDISM 06/03/2007  . Hernia   . Asthma   . Diabetes mellitus   . Hyperlipidemia   . Arthritis   . Myocardial infarction   . Osteoporosis    Past Surgical History:  Past Surgical History  Procedure Laterality Date  . Cardiac defibrillator placement    . Appendectomy    . Hysterectomy - unknown type    . Posterior fusion clivus-c2 transarticular stealth guided w/ icbg    . Cystectomy      elbow  . Icd      medtronic maximo  . Polypectomy      bladder  . Esophagogastroduodenoscopy N/A 10/30/2012    Procedure: ESOPHAGOGASTRODUODENOSCOPY (EGD);  Surgeon: Vertell Novak., MD;  Location: [facility];  Service: Endoscopy;  Laterality: N/A;   HPI:  79 yo female adm to [facility] with ? SBO vs enteritis- pt required NG decompression.  S/p EGD 3/14 showing gastric ulcerations vs NG trauma - no GOO.  Pt has extensive medical hx re: GI issues with nausea, epigastric pain dating back to at least 1991 per previous testing.  Pt previous testing includes gastric emptying that showed mild delay 04/2008, esophagram/ UGI 1991 and 2004:  moderate sized Zenker's diverticulum (right side), small HH, small GER.  Pt current med list includes Carafate, Reglan and a PPI.  She also takes Prilosec BID at home prior to admission.  Pt admits to occasionally "strangling" while eating/drinking - occurs with both consistencies and does not correlate this to specific time during intake.  She also acknowledges sensation of pharyngeal stasis and drinking liquids is helpful to decrease sensation.  Pt has been on a full liquid diet and consumed a good amount today for  breakfast.  Dg Chest 1 View  11/02/2012  **ADDENDUM** CREATED: 11/02/2012 16:40:08  Please disregard in the original report.  Due to a PACS error, the wrong images (from a different patient) were interpreted.  This has been corrected and the correct study is now loaded on PACS.  The correct report is as follows:  *RADIOLOGY REPORT*  Clinical Data: Congestion  CHEST - 1 VIEW  Comparison:  11/05/2010  Findings: Chronic interstitial markings/bronchitic changes.  No focal consolidation. No pleural effusion or pneumothorax.  The heart is normal in size.  Left subclavian ICD.  IMPRESSION: No evidence of acute cardiopulmonary disease.  **END ADDENDUM** SIGNED BY: Charline Bills, M.D.   11/02/2012  *RADIOLOGY REPORT*  Clinical Data: Chest congestion, cough  CHEST - 1 VIEW  Comparison: 11/05/2010  Findings: Lungs are clear.  No pleural effusion or pneumothorax.  The heart is top normal in size.  Postsurgical changes related to prior CABG.  Mild degenerative changes of the thoracic spine.  Multiple bilateral rib fracture deformities.  IMPRESSION: No evidence of acute cardiopulmonary disease.   Original Report Authenticated By: Charline Bills, M.D.        Assessment / Plan / Recommendation Clinical Impression  Pt observed to consume water only - as she is on a liquid diet- and just consumed breakfast.  No overt clinical indications of aspiration noted nor c/o stasis.  SLP reviewed previous testing results with pt.  Differential reasoning for sensation of pharyngeal stasis  may be due to Zenker's diverticulum causing stasis and/or known reflux issues.  Pt admits consuming liquids after solids aids pharyngeal clearance.  Significant education took place regarding strategies to mitigate aspiration risk due to pt's multifactorial dysphagia and teach back was used to assure comprehension.  Xerostomia also reported by pt.  Pt reports taking potassium pills dissolved in water due to her dysphagia.    Rec advance diet as  GI indicates with reflux precautions.    SLP to follow up once to assure all education is completed - allowing time for pt to review the information.  Thanks for this referral.     Aspiration Risk  Mild    Diet Recommendation  (when gI agrees, rec regular/thin-extra gravy/sauce)   Liquid Administration via: Straw;Cup Medication Administration:  (as tolerated, per pt) Supervision: Patient able to self feed Compensations: Slow rate;Small sips/bites;Follow solids with liquid (rest break if dyspneic-eat several small meals/day) Postural Changes and/or Swallow Maneuvers: Seated upright 90 degrees;Upright 30-60 min after meal    Other  Recommendations Oral Care Recommendations: Oral care QID   Follow Up Recommendations  None    Frequency and Duration min 1 x/week  1 week   Pertinent Vitals/Pain Afebrile, lungs CTA    SLP Swallow Goals Patient will utilize recommended strategies during swallow to increase swallowing safety with: Independent assistance   Swallow Study Prior Functional Status   fed self at home, eats regular diet    General Date of Onset: 11/04/12 HPI: 79 yo female adm to [facility] with ? SBO vs enteritis- pt required NG decompression.  S/p EGD 3/14 showing gastric ulcerations vs NG trauma - no GOO.  Pt has extensive medical hx re: GI issues with nausea, epigastric pain dating back to at least 1991 per previous testing.  Pt previous testing includes gastric emptying that showed mild delay 04/2008, esophagram/ UGI 1991 and 2004:  moderate sized Zenker's diverticulum (right side), small HH, small GER.  Pt current med list includes Carafate, Reglan and a PPI.  She also takes Prilosec BID at home prior to admission.  Pt admits to occasionally "strangling" while eating/drinking - occurs with both consistencies and does not correlate this to specific time during intake.  She also acknowledges sensation of pharyngeal stasis and drinking liquids is helpful to decrease sensation.  Pt has  been on a full liquid diet and consumed a good amount today for breakfast.   Type of Study: Bedside swallow evaluation Previous Swallow Assessment: MBS 2010 functional oropharyngeal swallow,  no aspiration or penetration of any consistency tested, liquids aid clearance of cervical esophageal stasis Diet Prior to this Study: Thin liquids (Full liquid) Temperature Spikes Noted: No Respiratory Status: Room air History of Recent Intubation: No Behavior/Cognition: Alert;Cooperative;Pleasant mood Self-Feeding Abilities: Able to feed self Patient Positioning: Upright in bed Baseline Vocal Quality: Clear Volitional Cough: Strong Volitional Swallow: Able to elicit    Oral/Motor/Sensory Function Overall Oral Motor/Sensory Function: Appears within functional limits for tasks assessed   Ice Chips Ice chips: Not tested   Thin Liquid Thin Liquid: Within functional limits Presentation: Cup;Straw;Self Fed    Nectar Thick Nectar Thick Liquid: Not tested   Honey Thick Honey Thick Liquid: Not tested   Puree Puree: Not tested (pt on liquid diet)   Solid   GO    Solid: Not tested (pt on liquid diet)      Donavan Burnet, MS [facility] SLP [phone]

## 2012-11-04 NOTE — Progress Notes (Addendum)
[facility] PROGRESS NOTE  ARIONA DESCHENE ZOX:[id number] DOB: April 12, 1930 DOA: 10/27/2012 PCP: Nadean Corwin, MD  Brief narrative: 79 y.o. female with a past medical history of hypothyroidism, chronic diastolic heart failure, type 2 diabetes who presented 10/28/2012 with possible small bowel obstruction versus enteritis. Patient was initially treated with NG tube decompression but this was stopped when it was evident that contrast had moved into the bowel and there was no evidence of ongoing obstruction. Further, patient underwent EGD for additional evaluation on 10/30/12 which showed possible gastric ulcerations versus NG trauma, but still no obstruction. On 11/02/2012, the patient became more short of breath, and given her history of heart failure, cardiology was consulted. Patient clinically improved with one dose of lasix. Plan is for discharge to SNF one nutritional status improves and patient shows she can tolerates a liquid diet.  Assessment/Plan:   Principal Problem:  *Abdominal Pain  Secondary to partial small bowel obstruction likely due to adhesions.  Gastric outlet obstruction ruled out with normal EGD 10/30/12. NG tube is discontinued. Biopsies negative for H. pylori and malignancy.   Per surgery, no role of surgical intervention at this time.  Continue protonix, carfate and reglan  Clear liquid diet. Active Problems:  Protein calorie malnutrition in the context of acute illness   Due to poor intake. Diet advanced to clear liquid, tolerates fine. Hypertension   Continue Cardizem 240 mg daily Metabolic acidosis   resolved Hypokalemia   Corrected and now WNL  Will give additional supplement today to keep potassium above 4  HYPOTHYROIDISM   Continue synthroid  CARDIOMYOPATHY, resolved / acute on chronic diastolic heart failure / status post pacemaker   2 D ECHO on this admission with EF 60%  Given one dose of lasix with good diureses and respiratory  status is now stable. COPD   Continue Spiriva.   Anemia of chronic disease  Likely secondary to B12 deficiency and anemia of chronic disease. B12 levels 185. Folic acid levels greater than 20. Normal ferritin. Iron low at less than 10. Urine iron binding capacity 219.   Vitamin B12 supplementation initiated (1,000 mcg daily x 1 week).   Fecal occult blood testing was negative.  Diabetes mellitus type 2 in nonobese   Continue sliding scale.   Code Status: Full.  Family Communication: family not at bedside today.  Disposition Plan: SNF possibly in next 24 hours.  Medical Consultants:  Dr. Lodema Pilot, Surgery  Dr. Carman Ching, Gastroenterology  Dr. Charlton Haws, Cardiology Other Consultants:  None. Anti-infectives:  None.     Manson Passey, MD  [facility] Pager [phone]  If 7PM-7AM, please contact night-coverage [email or web address] Password [facility] 11/04/2012, 4:57 PM   LOS: 8 days    HPI/Subjective: No acute overnight events. Sitting in bed; feels comfortable.  Objective: Filed Vitals:   11/04/12 0640 11/04/12 0838 11/04/12 1000 11/04/12 1409  BP: 166/55  148/60 149/70  Pulse: 84  77 70  Temp: 97.9 F (36.6 C)  98 F (36.7 C) 97.4 F (36.3 C)  TempSrc: Oral  Oral Oral  Resp: 16  17 16   Height:      Weight:      SpO2: 96% 95% 96% 94%    Intake/Output Summary (Last 24 hours) at 11/04/12 1657 Last data filed at 11/04/12 1606  Gross per 24 hour  Intake 1945.75 ml  Output    700 ml  Net 1245.75 ml    Exam:   General:  Pt is alert, follows commands appropriately, not in acute  distress  Cardiovascular: Regular rate and rhythm, S1/S2, no murmurs, no rubs, no gallops  Respiratory: Clear to auscultation bilaterally, no wheezing, no crackles, no rhonchi  Abdomen: Soft,tender in mid abdomen, bowel sounds present, no guarding  Extremities: No edema, pulses DP and PT palpable bilaterally  Neuro: Grossly nonfocal  Data Reviewed: Basic Metabolic Panel:  Recent  Labs Lab 10/30/12 0545 10/31/12 0758 11/02/12 0410 11/03/12 0350 11/04/12 0357  NA 140 137 134* 135 131*  K 4.7 4.5 4.8 3.7 3.6  CL 104 103 100 98 96  CO2 19 22 20 24 25   GLUCOSE 109* 95 92 99 106*  BUN 13 13 9 9 9   CREATININE 0.83 0.79 0.75 0.89 0.86  CALCIUM 8.7 8.5 8.6 8.6 8.5  MG  --   --   --  1.6  --   PHOS  --   --   --  3.4  --    Liver Function Tests:  Recent Labs Lab 11/03/12 0350  AST 12  ALT 8  ALKPHOS 145*  BILITOT 0.3  PROT 5.9*  ALBUMIN 1.9*   No results found for this basename: LIPASE, AMYLASE,  in the last 168 hours No results found for this basename: AMMONIA,  in the last 168 hours CBC:  Recent Labs Lab 10/29/12 0415 10/31/12 0758 11/02/12 0410 11/03/12 0350 11/04/12 0357  WBC 16.5* 16.6* 14.6* 14.8* 12.5*  NEUTROABS  --   --   --  12.1*  --   HGB 10.9* 10.5* 10.4* 10.3* 9.8*  HCT 35.2* 33.5* 33.2* 32.7* 31.0*  MCV 80.0 79.6 78.9 77.7* 77.7*  PLT 345 379 381 375 383   Cardiac Enzymes: No results found for this basename: CKTOTAL, CKMB, CKMBINDEX, TROPONINI,  in the last 168 hours BNP: No components found with this basename: POCBNP,  CBG:  Recent Labs Lab 11/03/12 2000 11/04/12 0001 11/04/12 0353 11/04/12 0743 11/04/12 1152  GLUCAP 181* 120* 136* 109* 182*    No results found for this or any previous visit (from the past 240 hour(s)).   Studies: Dg Forearm Right  11/02/2012  *RADIOLOGY REPORT*  Clinical Data: Pain, no injury  RIGHT FOREARM - 2 VIEW  Comparison: None.  Findings: Negative for fracture.  No focal bony lesion is present.  IMPRESSION: Negative   Original Report Authenticated By: Janeece Riggers, M.D.    Dg Humerus Right  11/02/2012  *RADIOLOGY REPORT*  Clinical Data: No known injury  RIGHT HUMERUS - 2+ VIEW  Comparison: 11/24/2009  Findings: Negative for fracture.  No focal bony lesion is present.  IMPRESSION: Negative   Original Report Authenticated By: Janeece Riggers, M.D.     Scheduled Meds: . antiseptic oral rinse  15  mL Mouth Rinse q12n4p  . bifidobacterium infantis  1 capsule Oral Daily  . chlorhexidine  15 mL Mouth Rinse BID  . diltiazem  240 mg Oral Daily  . feeding supplement  1 Container Oral TID BM  . insulin aspart  0-9 Units Subcutaneous Q4H  . levothyroxine  112 mcg Oral QAC breakfast  . metoCLOPramide (REGLAN) injection  5 mg Intravenous Q6H  . multivitamin with minerals  1 tablet Oral Daily  . pantoprazole (PROTONIX) IV  40 mg Intravenous Q12H  . potassium chloride  10 mEq Intravenous Q1 Hr x 3  . sucralfate  1 g Oral QID  . tiotropium  18 mcg Inhalation Daily   Continuous Infusions: . 0.9 % NaCl with KCl 20 mEq / L 20 mL/hr at 11/03/12 2100

## 2012-11-04 NOTE — Progress Notes (Signed)
Physical Therapy Treatment Patient Details Name: [name] MRN: [id number] DOB: [birth year] Today's Date: 11/04/2012 Time: 4098-1191 PT Time Calculation (min): 25 min  PT Assessment / Plan / Recommendation Comments on Treatment Session  pt doing better today; feels more steady with walker but continues to be a fall risk; would benefit from  SNF if agreeable    Follow Up Recommendations  SNF     Does the patient have the potential to tolerate intense rehabilitation     Barriers to Discharge        Equipment Recommendations  None recommended by PT    Recommendations for Other Services    Frequency Min 3X/week   Plan Discharge plan remains appropriate;Frequency remains appropriate    Precautions / Restrictions Precautions Precautions: Fall Restrictions Weight Bearing Restrictions: No   Pertinent Vitals/Pain No c/o pain    Mobility  Bed Mobility Bed Mobility: Not assessed Transfers Transfers: Sit to Stand;Stand to Sit Sit to Stand: From chair/3-in-1;4: Min assist (repeated x 4) Stand to Sit: To chair/3-in-1;4: Min assist;With upper extremity assist Details for Transfer Assistance: cues for hand placement Ambulation/Gait Ambulation/Gait Assistance: 4: Min assist Ambulation Distance (Feet): 25 Feet (times 2) Assistive device: Rolling walker Ambulation/Gait Assistance Details: cues for increased step length and Rw position Gait Pattern: Step-to pattern;Decreased stride length;Narrow base of support Gait velocity: decr.    Exercises     PT Diagnosis:    PT Problem List:   PT Treatment Interventions:     PT Goals Acute Rehab PT Goals Time For Goal Achievement: 11/16/12 Potential to Achieve Goals: Good Pt will go Sit to Stand: with supervision PT Goal: Sit to Stand - Progress: Progressing toward goal Pt will Transfer Bed to Chair/Chair to Bed: with supervision PT Transfer Goal: Bed to Chair/Chair to Bed - Progress: Progressing toward goal Pt will Ambulate:  16 - 50 feet;with supervision;with least restrictive assistive device PT Goal: Ambulate - Progress: Progressing toward goal  Visit Information  Last PT Received On: 11/04/12 Assistance Needed: +1    Subjective Data  Subjective: I am more steady with the RW Patient Stated Goal: return to prior level of function   Cognition  Cognition Overall Cognitive Status: Impaired Area of Impairment: Safety/judgement;Awareness of deficits;Awareness of errors Arousal/Alertness: Awake/alert Orientation Level: Appears intact for tasks assessed Behavior During Session: [facility] for tasks performed Safety/Judgement: Decreased awareness of need for assistance    Balance  Balance Balance Assessed: Yes Static Standing Balance Static Standing - Balance Support: No upper extremity supported;During functional activity Static Standing - Level of Assistance: 4: Min assist  End of Session PT - End of Session Equipment Utilized During Treatment: Gait belt Activity Tolerance: Patient tolerated treatment well Patient left: in chair;with call bell/phone within reach Nurse Communication: Mobility status   GP     [facility] 11/04/2012, 11:40 AM

## 2012-11-04 NOTE — Progress Notes (Signed)
Patient feels her pain is getting better everyday. Tolerated soft diet this morning. No nausea or vomiting. Slight abdominal pain, but less than before. Overall, the patient seems quite happy, and satisfied with her progress.  Potassium of 3.6 this morning noted. It might be worth giving a supplement so as to keep the level above four, to help maintain colonic motility, but I will defer to the hospitalist on this.  No additional suggestions at this time.  Florencia Reasons, M.D. [phone]

## 2012-11-04 NOTE — Progress Notes (Signed)
Patient ID: [name], female   DOB: 03/04/1930, 79 y.o.   MRN: [id number]  General Surgery - [facility] - Progress Note  Subjective: Patient taking soft diet for lunch.  Much less abdominal pain today.  Pleasant.  No nausea.  Objective: Vital signs in last 24 hours: Temp:  [97.3 F (36.3 C)-98 F (36.7 C)] 98 F (36.7 C) (03/19 1000) Pulse Rate:  [77-99] 77 (03/19 1000) Resp:  [16-18] 17 (03/19 1000) BP: (147-166)/(55-68) 148/60 mmHg (03/19 1000) SpO2:  [94 %-97 %] 96 % (03/19 1000) Last BM Date: 10/31/12  Intake/Output from previous day: 03/18 0701 - 03/19 0700 In: 1825.8 [P.O.:360; I.V.:1465.8] Out: 1450 [Urine:1450]  Exam: HEENT - clear, not icteric Neck - soft Chest - clear bilaterally Cor - RRR, no murmur Abd - soft with minimal distension; non-tender; BS present Ext - no significant edema Neuro - grossly intact, no focal deficits  Lab Results:   Recent Labs  11/03/12 0350 11/04/12 0357  WBC 14.8* 12.5*  HGB 10.3* 9.8*  HCT 32.7* 31.0*  PLT 375 383     Recent Labs  11/03/12 0350 11/04/12 0357  NA 135 131*  K 3.7 3.6  CL 98 96  CO2 24 25  GLUCOSE 99 106*  BUN 9 9  CREATININE 0.89 0.86  CALCIUM 8.6 8.5    Studies/Results: Dg Chest 1 View  11/02/2012  **ADDENDUM** CREATED: 11/02/2012 16:40:08  Please disregard in the original report.  Due to a PACS error, the wrong images (from a different patient) were interpreted.  This has been corrected and the correct study is now loaded on PACS.  The correct report is as follows:  *RADIOLOGY REPORT*  Clinical Data: Congestion  CHEST - 1 VIEW  Comparison:  11/05/2010  Findings: Chronic interstitial markings/bronchitic changes.  No focal consolidation. No pleural effusion or pneumothorax.  The heart is normal in size.  Left subclavian ICD.  IMPRESSION: No evidence of acute cardiopulmonary disease.  **END ADDENDUM** SIGNED BY: Charline Bills, M.D.   11/02/2012  *RADIOLOGY REPORT*   Clinical Data: Chest congestion, cough  CHEST - 1 VIEW  Comparison: 11/05/2010  Findings: Lungs are clear.  No pleural effusion or pneumothorax.  The heart is top normal in size.  Postsurgical changes related to prior CABG.  Mild degenerative changes of the thoracic spine.  Multiple bilateral rib fracture deformities.  IMPRESSION: No evidence of acute cardiopulmonary disease.   Original Report Authenticated By: Charline Bills, M.D.    Dg Forearm Right  11/02/2012  *RADIOLOGY REPORT*  Clinical Data: Pain, no injury  RIGHT FOREARM - 2 VIEW  Comparison: None.  Findings: Negative for fracture.  No focal bony lesion is present.  IMPRESSION: Negative   Original Report Authenticated By: Janeece Riggers, M.D.    Dg Abd 1 View  11/02/2012  *RADIOLOGY REPORT*  Clinical Data: Small bowel obstruction.  Abdominal pain  ABDOMEN - 1 VIEW  Comparison: 10/30/2012, 10/27/2012  Findings: There is gas throughout the colon extending to the rectum.  The colon is nondilated.  Small bowel is not dilated. There is contrast in the colon from recent CT.  IMPRESSION: Mild colonic distention.  Negative for small-bowel obstruction.   Original Report Authenticated By: Janeece Riggers, M.D.    Dg Humerus Right  11/02/2012  *RADIOLOGY REPORT*  Clinical Data: No known injury  RIGHT HUMERUS - 2+ VIEW  Comparison: 11/24/2009  Findings: Negative for fracture.  No focal bony lesion is present.  IMPRESSION: Negative   Original Report  Authenticated By: Janeece Riggers, M.D.     Assessment / Plan: 1.  Partial small bowel obstruction  Improved  No role for surgical intervention at present 2.  Peptic ulcer disease  Improving on medication  Surgical service will sign off.  Please call if needed to re-evaluate.  Velora Heckler, MD, [facility] Office: [phone]  11/04/2012

## 2012-11-04 NOTE — Progress Notes (Signed)
CSW made patient, husband & daughter, Lafonda Mosses (w#: [phone]) aware that [facility] (their first choice) has offered a bed. Patient is still hopeful though that she will be able to go straight home rather than SNF, but would like to hear what Dr. Elisabeth Pigeon recommends. CSW will follow-up tomorrow.   Unice Bailey, LCSW [facility] Clinical Social Worker cell #: [phone] (covering for [place])

## 2012-11-05 DIAGNOSIS — E43 Unspecified severe protein-calorie malnutrition: Secondary | ICD-10-CM

## 2012-11-05 LAB — BASIC METABOLIC PANEL
BUN: 8 mg/dL (ref 6–23)
CO2: 24 mEq/L (ref 19–32)
Chloride: 100 mEq/L (ref 96–112)
Creatinine, Ser: 0.89 mg/dL (ref 0.50–1.10)

## 2012-11-05 LAB — GLUCOSE, CAPILLARY: Glucose-Capillary: 174 mg/dL — ABNORMAL HIGH (ref 70–99)

## 2012-11-05 MED ORDER — BOOST / RESOURCE BREEZE PO LIQD: 1.0000 | Freq: Three times a day (TID) | ORAL | Status: DC

## 2012-11-05 MED ORDER — ADULT MULTIVITAMIN W/MINERALS CH: 1.0000 | ORAL_TABLET | Freq: Every day | ORAL | Status: DC

## 2012-11-05 MED ORDER — POTASSIUM CHLORIDE ER 10 MEQ PO TBCR: 20.0000 meq | EXTENDED_RELEASE_TABLET | Freq: Two times a day (BID) | ORAL | Status: DC

## 2012-11-05 MED ORDER — ALIGN PO CAPS: 1.0000 | ORAL_CAPSULE | Freq: Every day | ORAL | Status: DC

## 2012-11-05 MED ORDER — BIOTENE DRY MOUTH MT LIQD: 15.0000 mL | Freq: Two times a day (BID) | OROMUCOSAL | Status: DC

## 2012-11-05 MED ORDER — SUCRALFATE 1 GM/10ML PO SUSP: 1.0000 g | Freq: Four times a day (QID) | ORAL | Status: DC

## 2012-11-05 MED ORDER — PANTOPRAZOLE SODIUM 40 MG PO TBEC: 40.0000 mg | DELAYED_RELEASE_TABLET | Freq: Two times a day (BID) | ORAL | Status: DC

## 2012-11-05 MED ORDER — POTASSIUM CHLORIDE CRYS ER 20 MEQ PO TBCR
40.0000 meq | EXTENDED_RELEASE_TABLET | Freq: Once | ORAL | Status: AC
  Administered 2012-11-05: 40 meq via ORAL
  Filled 2012-11-05: qty 2

## 2012-11-05 MED ORDER — ACETAMINOPHEN 325 MG PO TABS: 650.0000 mg | ORAL_TABLET | Freq: Four times a day (QID) | ORAL | Status: DC | PRN

## 2012-11-05 MED ORDER — PANTOPRAZOLE SODIUM 40 MG PO TBEC
40.0000 mg | DELAYED_RELEASE_TABLET | Freq: Two times a day (BID) | ORAL | Status: DC
  Administered 2012-11-05 – 2012-11-06 (×2): 40 mg via ORAL
  Filled 2012-11-05 (×3): qty 1

## 2012-11-05 MED ORDER — ONDANSETRON HCL 4 MG PO TABS: 4.0000 mg | ORAL_TABLET | Freq: Four times a day (QID) | ORAL | Status: DC | PRN

## 2012-11-05 NOTE — Progress Notes (Signed)
This patient is receiving IV Protonix. Based on criteria approved by the Pharmacy and Therapeutics Committee, this medication is being converted to the equivalent oral dose form. These criteria include:   . The patient is eating (either orally or per tube) and/or has been taking other orally administered medications for at least 24 hours.  . This patient has no evidence of active gastrointestinal bleeding or impaired GI absorption (gastrectomy, short bowel, patient on TNA or NPO).   If you have questions about this conversion, please contact the pharmacy department.  Berkley Harvey, [facility] 11/05/2012 10:55 AM

## 2012-11-05 NOTE — Progress Notes (Signed)
Speech Language Pathology Dysphagia Treatment Patient Details Name: [name] MRN: [id number] DOB: [birth year] Today's Date: 11/05/2012 Time: 4098-1191 SLP Time Calculation (min): 31 min  Assessment / Plan / Recommendation Clinical Impression  Pt seen for skilled dysphagia tx to assure tolerance of diet and to review findings of previous swallowing evaluations and compensatory strategies to mitigate dysphagia.  Pt current diet order in the system is full liquid-lactose free.  SLP helped pt to order her lunchtime meal as her hearing loss prevented her ability to distinguish speech sounds from nutritional phone call.  She denies problems swallowing her liquids and admits they are easier for her due to less nausea with solids.   Intake listed as 50% with pt having nonproductive cough and she is afebrile.  RN advanced diet (while slp in room) per GI and primary MD desires to low residuals, heart healthy to assure pt tolerates prior to dc to SNF.  Advised pt to masticate her food thoroughly given her known Zenker's.     All education completed with pt re: her chronic dysphagia and no further input indicated. SLP to sign off.      Diet Recommendation  Continue with Current Diet: Thin liquid (defer to md)    SLP Plan     Pertinent Vitals/Pain Afebrile, nonproductive cough   Swallowing Goals  SLP Swallowing Goals Swallow Study Goal #2 - Progress: Met  General Respiratory Status: Room air Behavior/Cognition: Alert;Cooperative;Hard of hearing;Pleasant mood Patient Positioning: Upright in chair  Oral Cavity - Oral Hygiene    Oral cavity clear  Dysphagia Treatment Treatment focused on: Patient/family/caregiver education Family/Caregiver Educated: pt Treatment Methods/Modalities: Skilled observation Patient observed directly with PO's: Yes Type of PO's observed: Thin liquids Feeding: Needs set up Liquids provided via: Straw   GO     Donavan Burnet, MS [facility]  SLP [phone]

## 2012-11-05 NOTE — Discharge Summary (Signed)
Physician Discharge Summary  [name] EAV:[id number] DOB: [birth year] DOA: 10/27/2012  PCP: Nadean Corwin, MD  Admit date: 10/27/2012 Discharge date: 11/05/2012  Recommendations for Outpatient Follow-up:   Please note that the patient can continue Reglan 5 mg twice daily scheduled for additional month. Please assess whether this medication is needed over a longer period of time.  Please note that we have started potassium chloride 20 mEq twice daily supplement. Please recheck potassium every 3 days to make sure it is above 4, preferably 4.5 range to prevent colonic atony  Please continue Protonix 40 mg twice daily for next 2 weeks on discharge. Then start Protonix 40 mg once daily per    Discharge Diagnoses:  Principal Problem:   Small bowel obstruction Active Problems:   HYPOTHYROIDISM   CARDIOMYOPATHY, resolved   Chronic diastolic heart failure   COPD   Biventricular cardiac pacemaker [employer]   Incisional hernia   Abdominal pain   Anemia, multi-factorial, secondary to B12 deficiency and anemia of chronic disease   Diabetes mellitus type 2 in nonobese   Hypokalemia   Acute diastolic heart failure   Severe protein-calorie malnutrition in the context of acute illness  Discharge Condition: Medically stable and clinically appears well for discharge to skilled nursing facility today.  Diet recommendation: Liquid diet, may advance to high fiber solid food if patient tolerates.  History of present illness:  79 y.o. female with a past medical history of hypothyroidism, chronic diastolic heart failure, type 2 diabetes who presented 10/28/2012 with possible small bowel obstruction versus enteritis. Patient was initially treated with NG tube decompression but this was stopped when it was evident that contrast had moved into the bowel and there was no evidence of ongoing obstruction. Further, patient underwent EGD for additional evaluation on 10/30/12 which showed possible  gastric ulcerations versus NG trauma, but still no obstruction. On 11/02/2012, the patient became more short of breath, and given her history of heart failure, cardiology was consulted. Patient clinically improved with one dose of lasix.   Assessment/Plan:   Principal Problem:  *Abdominal Pain  Secondary to partial small bowel obstruction likely due to adhesions.  Gastric outlet obstruction ruled out with normal EGD 10/30/12. NG tube is discontinued. Biopsies negative for H. pylori and malignancy.  Per surgery, no role of surgical intervention at this time.  Continue protonix, reglan. Clear liquid diet and may advance to high fiber solid food is patient tolerates it Active Problems:  Protein calorie malnutrition in the context of acute illness  Due to poor intake. Diet advanced to clear liquid and soft mechanical, patient tolerates fine. Hypertension  Continue Cardizem 240 mg daily Metabolic acidosis  resolved Hypokalemia  We will discharge the patient with potassium chloride 20 mEq twice daily with instruction to followup potassium regularly to make sure it stays above 4 but within the normal range. HYPOTHYROIDISM  Continue synthroid  CARDIOMYOPATHY, resolved / acute on chronic diastolic heart failure / status post pacemaker  2 D ECHO on this admission with EF 60%  Given one dose of lasix with good diureses and respiratory status is now stable. COPD  Continue Spiriva.  Anemia of chronic disease  Likely secondary to B12 deficiency and anemia of chronic disease. B12 levels 185. Folic acid levels greater than 20. Normal ferritin. Iron low at less than 10. Urine iron binding capacity 219.  Vitamin B12 supplementation initiated (1,000 mcg daily x 1 week).  Fecal occult blood testing was negative.  Diabetes mellitus type 2 in nonobese  Continue home PO antihypertensives.   Code Status: Full.  Family Communication: family not at bedside today.   Medical Consultants:  Dr. Lodema Pilot,  Surgery  Dr. Carman Ching, Gastroenterology  Dr. Charlton Haws, Cardiology Other Consultants:  None. Anti-infectives:  None.   Manson Passey, MD  [facility]  Pager [phone]   Discharge Exam: Filed Vitals:   11/05/12 0455  BP: 164/72  Pulse: 95  Temp: 98.4 F (36.9 C)  Resp: 18   Filed Vitals:   11/04/12 1409 11/04/12 2239 11/05/12 0455 11/05/12 0749  BP: 149/70 152/60 164/72   Pulse: 70 79 95   Temp: 97.4 F (36.3 C) 98.3 F (36.8 C) 98.4 F (36.9 C)   TempSrc: Oral Oral Oral   Resp: 16 16 18    Height:      Weight:      SpO2: 94% 97% 100% 96%    General: Pt is alert, follows commands appropriately, not in acute distress Cardiovascular: Regular rate and rhythm, S1/S2 +, no murmurs, no rubs, no gallops Respiratory: Clear to auscultation bilaterally, no wheezing, no crackles, no rhonchi Abdominal: Soft, non tender,  bowel sounds +, no guarding Extremities: no edema, no cyanosis, pulses palpable bilaterally DP and PT Neuro: Grossly nonfocal  Discharge Instructions  Discharge Orders   Future Appointments Provider Department Dept Phone   11/10/2012 11:00 AM Duke Salvia, MD [facility] Main [facility] [place]) [phone]   Future Orders Complete By Expires     Call MD for:  difficulty breathing, headache or visual disturbances  As directed     Call MD for:  persistant dizziness or light-headedness  As directed     Call MD for:  persistant nausea and vomiting  As directed     Call MD for:  severe uncontrolled pain  As directed     Diet - low sodium heart healthy  As directed     Discharge instructions  As directed     Comments:      Please note that the patient can continue Reglan 5 mg twice daily scheduled for additional month. Please assess whether this medication is needed over a longer period of time.    Increase activity slowly  As directed         Medication List    STOP taking these medications       CEPHALEXIN PO      TAKE these medications        acetaminophen 325 MG tablet  Commonly known as:  TYLENOL  Take 2 tablets (650 mg total) by mouth every 6 (six) hours as needed.     antiseptic oral rinse Liqd  15 mLs by Mouth Rinse route 2 times daily at 12 noon and 4 pm.     bifidobacterium infantis capsule  Take 1 capsule by mouth daily.     clobetasol 0.05 % external solution  Commonly known as:  TEMOVATE  Apply 1 application topically at bedtime.     diltiazem 240 MG 24 hr capsule  Commonly known as:  CARDIZEM CD  Take 240 mg by mouth daily.     diphenhydrAMINE 25 MG tablet  Commonly known as:  BENADRYL  Take 50 mg by mouth at bedtime as needed for sleep.     feeding supplement Liqd  Take 1 Container by mouth 3 (three) times daily between meals.     glyBURIDE 5 MG tablet  Commonly known as:  DIABETA  Take 5 mg by mouth. If glucose > 200.  ketoconazole 2 % shampoo  Commonly known as:  NIZORAL  Apply topically 3 (three) times a week.     levalbuterol 45 MCG/ACT inhaler  Commonly known as:  XOPENEX HFA  Inhale 1-2 puffs into the lungs every 6 (six) hours as needed for wheezing.     levalbuterol 45 MCG/ACT inhaler  Commonly known as:  XOPENEX HFA  Inhale 1-2 puffs into the lungs 2 (two) times daily as needed for wheezing.     levothyroxine 112 MCG tablet  Commonly known as:  SYNTHROID, LEVOTHROID  Take 112 mcg by mouth daily.     multivitamin with minerals Tabs  Take 1 tablet by mouth daily.     omeprazole 20 MG capsule  Commonly known as:  PRILOSEC  Take 1 capsule by mouth 2 (two) times daily.     ondansetron 4 MG tablet  Commonly known as:  ZOFRAN  Take 1 tablet (4 mg total) by mouth every 6 (six) hours as needed for nausea.     polyethylene glycol packet  Commonly known as:  MIRALAX / GLYCOLAX  Take by mouth. 1/2 cup daily     SPIRIVA HANDIHALER 18 MCG inhalation capsule  Generic drug:  tiotropium  inhale the contents of one capsule in the handihaler once daily     sucralfate 1 GM/10ML suspension   Commonly known as:  CARAFATE  Take 10 mLs (1 g total) by mouth 4 (four) times daily.     traMADol 50 MG tablet  Commonly known as:  ULTRAM  Take 25-50 mg by mouth every 4 (four) hours as needed for pain.           Follow-up Information   Follow up with MCKEOWN,WILLIAM DAVID, MD In 2 weeks.   Contact information:   [address] [place] [address] [phone]        The results of significant diagnostics from this hospitalization (including imaging, microbiology, ancillary and laboratory) are listed below for reference.    Significant Diagnostic Studies: Dg Chest 1 View  11/02/2012  **ADDENDUM** CREATED: 11/02/2012 16:40:08  Please disregard in the original report.  Due to a PACS error, the wrong images (from a different patient) were interpreted.  This has been corrected and the correct study is now loaded on PACS.  The correct report is as follows:  *RADIOLOGY REPORT*  Clinical Data: Congestion  CHEST - 1 VIEW  Comparison:  11/05/2010  Findings: Chronic interstitial markings/bronchitic changes.  No focal consolidation. No pleural effusion or pneumothorax.  The heart is normal in size.  Left subclavian ICD.  IMPRESSION: No evidence of acute cardiopulmonary disease.  **END ADDENDUM** SIGNED BY: Charline Bills, M.D.   11/02/2012  *RADIOLOGY REPORT*  Clinical Data: Chest congestion, cough  CHEST - 1 VIEW  Comparison: 11/05/2010  Findings: Lungs are clear.  No pleural effusion or pneumothorax.  The heart is top normal in size.  Postsurgical changes related to prior CABG.  Mild degenerative changes of the thoracic spine.  Multiple bilateral rib fracture deformities.  IMPRESSION: No evidence of acute cardiopulmonary disease.   Original Report Authenticated By: Charline Bills, M.D.    Dg Forearm Right  11/02/2012  *RADIOLOGY REPORT*  Clinical Data: Pain, no injury  RIGHT FOREARM - 2 VIEW  Comparison: None.  Findings: Negative for fracture.  No focal bony lesion is  present.  IMPRESSION: Negative   Original Report Authenticated By: Janeece Riggers, M.D.    Dg Abd 1 View  11/02/2012  *RADIOLOGY REPORT*  Clinical Data: Small  bowel obstruction.  Abdominal pain  ABDOMEN - 1 VIEW  Comparison: 10/30/2012, 10/27/2012  Findings: There is gas throughout the colon extending to the rectum.  The colon is nondilated.  Small bowel is not dilated. There is contrast in the colon from recent CT.  IMPRESSION: Mild colonic distention.  Negative for small-bowel obstruction.   Original Report Authenticated By: Janeece Riggers, M.D.    Dg Abd 1 View  10/30/2012  *RADIOLOGY REPORT*  Clinical Data: History of small-bowel obstruction.  ABDOMEN - 1 VIEW  Comparison: 10/29/2012.  Findings: Tip of enteric tube terminates in the area of the body of the stomach.  Distal portion of cardiac lead is seen in place. Cholecystectomy clips are seen.  Contrast is seen within the colon and rectum.  There is diverticulosis coli.  There is moderate dilatation of a few small intestinal loops.  This is similar to previous study.  There is minimal degenerative spondylosis. No opaque calculi evident.  IMPRESSION: Enteric tube in place.  Contrast seen within colon and rectum. Diverticulosis colon.  Moderate dilatation of a few small intestinal loops similar to previous study.   Original Report Authenticated By: Onalee Hua Call    Dg Abd 1 View  10/29/2012  *RADIOLOGY REPORT*  Clinical Data: Follow up gastric distention after nasogastric tube placement.  ABDOMEN - 1 VIEW  Comparison: One-view abdomen x-ray yesterday and CT abdomen and pelvis yesterday.  Findings: Complete gastric decompression after nasogastric tube placement.  Bowel gas pattern unremarkable without evidence of obstruction or significant ileus.  Small amount of residual oral contrast material from the CT yesterday throughout decompressed colon.  Surgical clips in the right upper quadrant from prior cholecystectomy.  IMPRESSION: Complete gastric decompression  after nasogastric tube placement. No acute abdominal abnormality.   Original Report Authenticated By: Hulan Saas, M.D.    Dg Abd 1 View  10/28/2012  *RADIOLOGY REPORT*  Clinical Data: Follow up small bowel obstruction  ABDOMEN - 1 VIEW  Comparison: 10/27/12  Findings: Cholecystectomy clips are noted in the right upper quadrant.  Nasogastric tube tip is in the stomach.  The stomach is moderately distended with air.  There has been progression of the enteric contrast material into the colon.  No dilated loops of small bowel identified.  IMPRESSION:  1.  Progression of enteric contrast material into the colon. 2.  Gaseous distention of the stomach.   Original Report Authenticated By: Signa Kell, M.D.    Ct Abdomen Pelvis W Contrast  10/27/2012  *RADIOLOGY REPORT*  Clinical Data: Abdominal pain and distension  CT ABDOMEN AND PELVIS WITH CONTRAST  Technique:  Multidetector CT imaging of the abdomen and pelvis was performed following the standard protocol during bolus administration of intravenous contrast.  Contrast: 50mL OMNIPAQUE IOHEXOL 300 MG/ML  SOLN, OMNIPAQUE IOHEXOL 300 MG/ML  SOLN  Comparison: 12/28/2010  Findings: Partially imaged pacing wires along the right ventricle and coronary sinus.  Heart size within normal range.  Emphysematous changes at the lung bases.  There are linear areas of scarring within the left lung base.  Unremarkable liver.  The gallbladder not visualized.  Minimal prominence of the CBD at 8 mm is nonspecific post cholecystectomy and similar to prior.  Unremarkable spleen, pancreas, adrenal glands.  Upper pole right renal cyst.  Lobular renal contours.  Areas of scarring.  No hydronephrosis or hydroureter.  Pelvic floor laxity.  Anterior abdominal wall hernia containing the anterior wall of a segment of transverse colon.  No CT evidence for incarceration or obstruction.  There is a short segment of jejunum with thick wall, favored to be secondary to incomplete  distension. However, more distal jejunal loop within the midline lower abdomen shows mild peri visceral fat stranding and fluid.  Air fluid levels just proximally, and upper normal diameter at 2.9 cm.  The more distal small bowel loops are relatively decompressed.  No free intraperitoneal air.  No lymphadenopathy.  Portocaval lymph nodes measuring up to 9 mm short axis, nonspecific.  There is scattered atherosclerotic calcification of the aorta and its branches. No aneurysmal dilatation.  Retroaortic left renal vein.  Thin-walled bladder.  Absent uterus.  No adnexal mass.  Osteopenia and multilevel degenerative changes.  Heterogeneous attenuation of the marrow is nonspecific however similar to prior.  IMPRESSION: Segment of jejunum with perivisceral fat stranding and fluid. Most in keeping with a nonspecific enteritis (infectious, inflammatory, and ischemic considerations).  The more distal small bowel loops are decompressed.  This is favored to be due to delayed transit/ileus.  However, KUB follow-up to document passage of ingested contrast into the colon is recommended to exclude early obstruction.  Anterior abdominal wall hernia containing the anterior wall of a segment of transverse colon.  No CT evidence for incarceration or obstruction.  Heterogeneous appearance to the bones is nonspecific.  May be secondary to osteopenia or renal osteodystrophy. An infiltrative marrow process (such as myeloma or lymphoma) is not excluded however not favored given the stability.   Original Report Authenticated By: Jearld Lesch, M.D.    US Venous Img Lower Unilateral Left  10/06/2012  *RADIOLOGY REPORT*  Clinical Data:  left lower extremity pain and swelling  LEFT  LOWER EXTREMITY VENOUS DUPLEX ULTRASOUND  Technique:  Gray-scale sonography with graded compression, as well as color Doppler and duplex ultrasound were performed to evaluate the deep venous system of the lower extremity from the level of the common femoral  vein through the popliteal and proximal calf veins. Spectral Doppler was utilized to evaluate flow at rest and with distal augmentation maneuvers.  Comparison:  None.  Findings:  Normal compressibility of the common femoral, superficial femoral, and popliteal veins is demonstrated, as well as the visualized proximal calf veins.  No filling defects to suggest DVT on grayscale or color Doppler imaging.  Doppler waveforms show normal direction of venous flow, normal respiratory phasicity and response to augmentation.  IMPRESSION: No evidence of lower extremity deep vein thrombosis.   Original Report Authenticated By: Judie Petit. Shick, M.D.    Dg Humerus Right  11/02/2012  *RADIOLOGY REPORT*  Clinical Data: No known injury  RIGHT HUMERUS - 2+ VIEW  Comparison: 11/24/2009  Findings: Negative for fracture.  No focal bony lesion is present.  IMPRESSION: Negative   Original Report Authenticated By: Janeece Riggers, M.D.     Microbiology: No results found for this or any previous visit (from the past 240 hour(s)).   Labs: Basic Metabolic Panel:  Recent Labs Lab 10/31/12 0758 11/02/12 0410 11/03/12 0350 11/04/12 0357 11/05/12 0405  NA 137 134* 135 131* 135  K 4.5 4.8 3.7 3.6 3.4*  CL 103 100 98 96 100  CO2 22 20 24 25 24   GLUCOSE 95 92 99 106* 99  BUN 13 9 9 9 8   CREATININE 0.79 0.75 0.89 0.86 0.89  CALCIUM 8.5 8.6 8.6 8.5 8.3*  MG  --   --  1.6  --   --   PHOS  --   --  3.4  --   --    Liver  Function Tests:  Recent Labs Lab 11/03/12 0350  AST 12  ALT 8  ALKPHOS 145*  BILITOT 0.3  PROT 5.9*  ALBUMIN 1.9*   No results found for this basename: LIPASE, AMYLASE,  in the last 168 hours No results found for this basename: AMMONIA,  in the last 168 hours CBC:  Recent Labs Lab 10/31/12 0758 11/02/12 0410 11/03/12 0350 11/04/12 0357  WBC 16.6* 14.6* 14.8* 12.5*  NEUTROABS  --   --  12.1*  --   HGB 10.5* 10.4* 10.3* 9.8*  HCT 33.5* 33.2* 32.7* 31.0*  MCV 79.6 78.9 77.7* 77.7*  PLT 379 381  375 383   Cardiac Enzymes: No results found for this basename: CKTOTAL, CKMB, CKMBINDEX, TROPONINI,  in the last 168 hours BNP: BNP (last 3 results)  Recent Labs  11/02/12 1607  PROBNP 6510.0*   CBG:  Recent Labs Lab 11/04/12 1640 11/04/12 2008 11/05/12 0021 11/05/12 0408 11/05/12 0745  GLUCAP 161* 158* 148* 102* 123*    Time coordinating discharge: Over 30 minutes  Signed:  Manson Passey, MD  [facility]  11/05/2012, 11:09 AM  Pager #: [phone]

## 2012-11-05 NOTE — Progress Notes (Signed)
CSW received notification from MD that MD spoke with Gastroenterology and with pt daughter and pt d/c to [facility] will be delayed until tomorrow to ensure that pt can tolerate advancement of diet.   CSW notified [facility] in [facility].  CSW updated pt daughter, Lafonda Mosses via telephone.   CSW to continue to follow and assist with pt discharge plans to [facility].  Jacklynn Lewis, MSW, LCSWA  Clinical Social Work [phone]

## 2012-11-05 NOTE — Progress Notes (Signed)
GASTROENTEROLOGY PROGRESS NOTE  Problem:   1. Resolving small bowel obstruction, presumably due to adhesions 2. Ongoing abdominal pain 3. Hypokalemia, mild 4. Gastric ulceration on recent endoscopy  Subjective: The patient is quite pleased with how she is doing. She did have abdominal pain last night requiring some morphine, but she feels like she is continuing to get better with respect to her pain every day. She says she had for trace of full it would diet yesterday and is not having any problem keeping it down. No nausea. No evident food intolerance. She has been moving her bowels daily for the past several days, by her report.  She is currently on IV Protonix and 4 times a day sucralfate for her gastric ulcerations.  Objective: The patient is eating her breakfast, smiling, appears in no distress whatsoever.  Abdomen is slightly distended and tympanitic, with left upper quadrant mild to moderate tenderness which appears to be associated with a dilated, gas-filled loop of intestine.  Potassium is 3.4 today.  Gastric biopsies from last Friday show ulcerated gastric mucosa without evidence of Helicobacter pylori infection or any dysplasia or cancer.  Assessment: 1. Resolving small bowel obstruction, but still with some degree of intestinal distention which could be related to ileus (for example, do to hypokalemia and/or pain medications) or to residual obstruction. Based on her most recent x-ray, I actually think this is more due to colonic gas retention rather than small bowel obstruction at the present time.  2. Gastric tension, see above.  3. Hypokalemia, mild  4. Mild gastric ulcerations, which I suspect were probably more related to NG tube trauma been primary peptic ulceration, based on the fact that the patient was not on aspirin or nonsteroidal anti-inflammatory drugs (to my knowledge) prior to admission, and based on the location of the ulcers in the proximal  stomach.  Plan: 1. I will plan to sign off at this time. Please call if further input from me is desired.  2. Recommend supplementing potassium to keep serum potassium level at least above 4.0, preferably closer to 4.5. The should help with her colonic atony and gas retention and associated discomfort.  3. I feel it is appropriate to advance her to a low fiber (low residue) solid diet at this time, watching for evidence of worsened abdominal pain or recurrent obstruction.  4. With respect to her gastric ulcerations, they should heal fairly quickly. I would recommend changing her IV Protonix to PO at this time. I would probably keep her on twice-daily dosing while in the hospital, then decrease to once a day following discharge. I would probably continue the PPI for 2 months, but I think it could probably be stopped thereafter unless she is going to be on ulcerogenic medications such as aspirin or nonsteroidal anti-inflammatory drugs, in which case I would probably keep her on PPI prophylaxis indefinitely in view of her advanced age and history of gastric ulceration. I would keep the patient on sucralfate until discharge, at which time I think it could be stopped.  5. I have discussed the patient's case with her daughter, Lafonda Mosses, by telephone today for about 10 minutes and brought her up to date on the patient's status and my recommendations.  6. The patient has a followup office visit with her primary gastroenterologist, Dr. Vilinda Boehringer, on March 26 at 10:15 AM.   Florencia Reasons, M.D.   [phone] 11/05/2012 9:00 AM

## 2012-11-05 NOTE — Progress Notes (Signed)
CSW received notification from RN that pt daughter had questions about coverage for pt insurance at [facility].   CSW spoke with [facility] and found out coverage to notify pt daughter, Lafonda Mosses.  CSW also spoke with pt daughter re: pt being medically stable for discharge today. Pt daughter became very frustrated with this information as pt daughter states that she received contradicting information from a consulting MD and pt daughter does not think that pt is medically stable for discharge.  CSW discussed with pt daughter that CSW would notify MD of pt daughter concern and ask MD to contact pt daughter to discuss.  CSW sent page to MD re: pt daughter concerns and provided pt daughter contact information.  CSW sent pt discharge information to [facility] to prepare for disposition.  CSW to follow up with MD and pt daughter.   Jacklynn Lewis, MSW, LCSWA  Clinical Social Work [phone]

## 2012-11-05 NOTE — Progress Notes (Signed)
Physical Therapy Treatment Patient Details Name: [name] MRN: [id number] DOB: February 14, 1930 Today's Date: 11/05/2012 Time: 4098-1191 PT Time Calculation (min): 29 min  PT Assessment / Plan / Recommendation Comments on Treatment Session  Assisted pt OOB to [facility] to void then static standing at sink for 6 min to wash hands, face and brush teeth.  Pt became fatigued so sat and rested before amb in hallway.      Follow Up Recommendations  SNF     Does the patient have the potential to tolerate intense rehabilitation     Barriers to Discharge        Equipment Recommendations       Recommendations for Other Services    Frequency Min 3X/week   Plan Discharge plan remains appropriate;Frequency remains appropriate    Precautions / Restrictions Precautions Precautions: Fall Restrictions Weight Bearing Restrictions: No   Pertinent Vitals/Pain C/o weakness    Mobility  Bed Mobility Bed Mobility: Supine to Sit Supine to Sit: 4: Min assist;HOB elevated Details for Bed Mobility Assistance: increased time Transfers Transfers: Sit to Stand;Stand to Sit Sit to Stand: 4: Min assist;From bed Stand to Sit: 4: Min assist;To chair/3-in-1 Details for Transfer Assistance: 25% VC's for hand placement.  Static standing at sink x 6 min to wash hands, face and brush teeth.  Pt became fatigued and mild dizzyness.  So returned to recliner to rest before amb. Ambulation/Gait Ambulation/Gait Assistance: 4: Min assist Assistive device: Rolling walker Ambulation/Gait Assistance Details: 25% VC's on proper walker to self distance and increased time. RA avg 92% and HR 101 with amb.  Pt demon 3/4 DOE.  Limited activity tolerance. Gait Pattern: Step-to pattern;Decreased stride length;Narrow base of support Gait velocity: decreased     PT Goals                                                            progressing    Visit Information  Last PT Received On: 11/05/12 Assistance Needed: +1     Subjective Data      Cognition       Balance     End of Session PT - End of Session Equipment Utilized During Treatment: Gait belt Activity Tolerance: Patient limited by fatigue Patient left: in chair;with call bell/phone within reach   Felecia Shelling  PTA [facility]  Acute  Rehab Pager      [phone]

## 2012-11-06 DIAGNOSIS — I5032 Chronic diastolic (congestive) heart failure: Secondary | ICD-10-CM

## 2012-11-06 LAB — GLUCOSE, CAPILLARY
Glucose-Capillary: 116 mg/dL — ABNORMAL HIGH (ref 70–99)
Glucose-Capillary: 116 mg/dL — ABNORMAL HIGH (ref 70–99)

## 2012-11-06 MED ORDER — BISACODYL 10 MG RE SUPP
10.0000 mg | Freq: Once | RECTAL | Status: AC
  Administered 2012-11-06: 10 mg via RECTAL
  Filled 2012-11-06: qty 1

## 2012-11-06 MED ORDER — DOCUSATE SODIUM 100 MG PO CAPS: 100.0000 mg | ORAL_CAPSULE | Freq: Two times a day (BID) | ORAL | Status: DC

## 2012-11-06 MED ORDER — DOCUSATE SODIUM 100 MG PO CAPS
100.0000 mg | ORAL_CAPSULE | Freq: Two times a day (BID) | ORAL | Status: DC
  Administered 2012-11-06: 100 mg via ORAL
  Filled 2012-11-06 (×2): qty 1

## 2012-11-06 MED ORDER — POLYETHYLENE GLYCOL 3350 17 G PO PACK
17.0000 g | PACK | Freq: Every day | ORAL | Status: DC
  Administered 2012-11-06: 17 g via ORAL
  Filled 2012-11-06: qty 1

## 2012-11-06 MED ORDER — POLYETHYLENE GLYCOL 3350 17 G PO PACK: 17.0000 g | PACK | Freq: Every day | ORAL | Status: DC

## 2012-11-06 MED ORDER — BISACODYL 5 MG PO TBEC: 5.0000 mg | DELAYED_RELEASE_TABLET | Freq: Every day | ORAL | Status: DC | PRN

## 2012-11-06 NOTE — Progress Notes (Signed)
Physical Therapy Treatment Patient Details Name: [name] MRN: [id number] DOB: 12/26/1929 Today's Date: 11/06/2012 Time: 8657-8469 PT Time Calculation (min): 25 min  PT Assessment / Plan / Recommendation Comments on Treatment Session  Amb pt twice to BR for attempted BM's.  Amb with a RW and with her straight cane.  Pt still too unsteady with the cane and required VC's on proper sequencing.  Pt plans to D/C to [facility] for ST Rehab.    Follow Up Recommendations  SNF     Does the patient have the potential to tolerate intense rehabilitation     Barriers to Discharge        Equipment Recommendations       Recommendations for Other Services    Frequency Min 3X/week   Plan Discharge plan remains appropriate;Frequency remains appropriate    Precautions / Restrictions Precautions Precautions: Fall Restrictions Weight Bearing Restrictions: No   Pertinent Vitals/Pain C/o fatigue    Mobility  Bed Mobility Bed Mobility: Not assessed Details for Bed Mobility Assistance: Pt  OOB in recliner Transfers Transfers: Sit to Stand;Stand to Sit Sit to Stand: 4: Min assist;With armrests;From chair/3-in-1;From toilet Stand to Sit: 4: Min assist;To chair/3-in-1;To toilet Details for Transfer Assistance: min cues for hand placement but pt continues to sit while griping to RW.  Assisted pt to bathroom twice due to BM attempts. Ambulation/Gait Ambulation/Gait Assistance: 4: Min assist Assistive device: Rolling walker Ambulation/Gait Assistance Details: 25% VC's on proper walker to self distance and safety with turns. Assisted pt tp BR tice for attempted BM's.  Gait Pattern: Step-to pattern;Decreased stride length;Narrow base of support Gait velocity: decreased     PT Goals                                                       progressing    Visit Information  Last PT Received On: 11/06/12 Assistance Needed: +1    Subjective Data      Cognition  Cognition Overall  Cognitive Status: Impaired Area of Impairment: Safety/judgement;Awareness of deficits;Awareness of errors Arousal/Alertness: Awake/alert Orientation Level: Appears intact for tasks assessed Behavior During Session: [facility] for tasks performed Safety/Judgement: Decreased safety judgement for tasks assessed    Balance   fair-  End of Session PT - End of Session Equipment Utilized During Treatment: Gait belt Activity Tolerance: Patient limited by fatigue Patient left: in chair;with call bell/phone within reach Nurse Communication: Mobility status   Felecia Shelling  PTA [facility]  Acute  Rehab Pager      [phone]

## 2012-11-06 NOTE — Progress Notes (Signed)
Patient was given dulcolax supp,miralax and colace, had very small amount of loose bm x3

## 2012-11-06 NOTE — Progress Notes (Signed)
[facility] PROGRESS NOTE  [name] NWG:[id number] DOB: [birth year] DOA: 10/27/2012 PCP: Nadean Corwin, [name]  Brief narrative: Please refer to discharge summary done 11/05/2012.  79 y.o. female with a past medical history of hypothyroidism, chronic diastolic heart failure, type 2 diabetes who presented 10/28/2012 with possible small bowel obstruction versus enteritis. Patient was initially treated with NG tube decompression but this was stopped when it was evident that contrast had moved into the bowel and there was no evidence of ongoing obstruction. Further, patient underwent EGD for additional evaluation on 10/30/12 which showed possible gastric ulcerations versus NG trauma, but still no obstruction. On 11/02/2012, the patient became more short of breath, and given her history of heart failure, cardiology was consulted. Patient clinically improved with one dose of lasix.  Patient is now stable to be transferred to nursing home. She has tolerated solid foods very well with no nausea or vomiting.  Assessment/Plan:  Principal Problem:  *Abdominal Pain  Secondary to partial small bowel obstruction likely due to adhesions.  Gastric outlet obstruction ruled out with normal EGD 10/30/12. NG tube is discontinued. Biopsies negative for H. pylori and malignancy.  Per surgery, no role of surgical intervention at this time.  Continue protonix, reglan.  Continue diet as tolerated. Added Colace, MiraLAX and bisacodyl for bowel regimen. Active Problems:  Protein calorie malnutrition in the context of acute illness  Due to poor intake. Diet as tolerated. Hypertension  Continue Cardizem 240 mg daily Metabolic acidosis  resolved Hypokalemia  We will discharge the patient with potassium chloride 20 mEq twice daily with instruction to followup potassium regularly to make sure it stays above 4 but within the normal range. HYPOTHYROIDISM  Continue synthroid  CARDIOMYOPATHY, resolved /  acute on chronic diastolic heart failure / status post pacemaker  2 D ECHO on this admission with EF 60%  Given one dose of lasix with good diureses and respiratory status is now stable. COPD  Continue Spiriva.  Anemia of chronic disease  Likely secondary to B12 deficiency and anemia of chronic disease. B12 levels 185. Folic acid levels greater than 20. Normal ferritin. Iron low at less than 10. Urine iron binding capacity 219.  Vitamin B12 supplementation initiated (1,000 mcg daily x 1 week).  Fecal occult blood testing was negative.  Diabetes mellitus type 2 in nonobese  Continue home PO antihypertensives.  Code Status: Full.  Family Communication: family not at bedside today. I have updated patient's daughter over the phone daily. She is in agreement with discharge plan.  Medical Consultants:  Dr. Lodema Pilot, Surgery  Dr. Carman Ching, Gastroenterology  Dr. Charlton Haws, Cardiology Other Consultants:  None. Anti-infectives:  None.   [name], [name]  [facility]  Pager [phone]   If 7PM-7AM, please contact night-coverage [email or web address] Password [email or web address] 11/06/2012, 11:13 AM   LOS: 10 days    HPI/Subjective: No acute overnight events. Patient has tolerated solid food well. No nausea or vomiting reported.  Objective: Filed Vitals:   11/05/12 1439 11/05/12 2236 11/06/12 0653 11/06/12 0739  BP: 155/78 167/67 157/77   Pulse: 94 89 78   Temp: 98 F (36.7 C) 98.6 F (37 C) 98.3 F (36.8 C)   TempSrc: Oral Oral Oral   Resp: 17 16 15    Height:      Weight:      SpO2: 96% 96% 95% 94%    Intake/Output Summary (Last 24 hours) at 11/06/12 1113 Last data filed at 11/05/12 2253  Gross per 24 hour  Intake  827 ml  Output    850 ml  Net    -23 ml    Exam:   General:  Pt is alert, follows commands appropriately, not in acute distress  Cardiovascular: Regular rate and rhythm, S1/S2, no murmurs, no rubs, no gallops  Respiratory: Clear to auscultation bilaterally, no  wheezing, no crackles, no rhonchi  Abdomen: firm and mildly distended; tender in mid abdomen only mildly, bowel sounds present, no guarding  Extremities: No edema, pulses DP and PT palpable bilaterally  Neuro: Grossly nonfocal  Data Reviewed: Basic Metabolic Panel:  Recent Labs Lab 10/31/12 0758 11/02/12 0410 11/03/12 0350 11/04/12 0357 11/05/12 0405  NA 137 134* 135 131* 135  K 4.5 4.8 3.7 3.6 3.4*  CL 103 100 98 96 100  CO2 22 20 24 25 24   GLUCOSE 95 92 99 106* 99  BUN 13 9 9 9 8   CREATININE 0.79 0.75 0.89 0.86 0.89  CALCIUM 8.5 8.6 8.6 8.5 8.3*  MG  --   --  1.6  --   --   PHOS  --   --  3.4  --   --    Liver Function Tests:  Recent Labs Lab 11/03/12 0350  AST 12  ALT 8  ALKPHOS 145*  BILITOT 0.3  PROT 5.9*  ALBUMIN 1.9*   No results found for this basename: LIPASE, AMYLASE,  in the last 168 hours No results found for this basename: AMMONIA,  in the last 168 hours CBC:  Recent Labs Lab 10/31/12 0758 11/02/12 0410 11/03/12 0350 11/04/12 0357  WBC 16.6* 14.6* 14.8* 12.5*  NEUTROABS  --   --  12.1*  --   HGB 10.5* 10.4* 10.3* 9.8*  HCT 33.5* 33.2* 32.7* 31.0*  MCV 79.6 78.9 77.7* 77.7*  PLT 379 381 375 383   Cardiac Enzymes: No results found for this basename: CKTOTAL, CKMB, CKMBINDEX, TROPONINI,  in the last 168 hours BNP: No components found with this basename: POCBNP,  CBG:  Recent Labs Lab 11/05/12 1155 11/05/12 1635 11/05/12 2016 11/06/12 0022 11/06/12 0405  GLUCAP 139* 102* 174* 116* 116*    No results found for this or any previous visit (from the past 240 hour(s)).   Studies: No results found.  Scheduled Meds: . antiseptic oral rinse  15 mL Mouth Rinse q12n4p  . bifidobacterium infantis  1 capsule Oral Daily  . chlorhexidine  15 mL Mouth Rinse BID  . diltiazem  240 mg Oral Daily  . docusate sodium  100 mg Oral BID  . feeding supplement  1 Container Oral TID BM  . insulin aspart  0-9 Units Subcutaneous Q4H  . levothyroxine   112 mcg Oral QAC breakfast  . metoCLOPramide (REGLAN) injection  5 mg Intravenous Q6H  . multivitamin with minerals  1 tablet Oral Daily  . pantoprazole  40 mg Oral BID  . polyethylene glycol  17 g Oral Daily  . sucralfate  1 g Oral QID  . tiotropium  18 mcg Inhalation Daily   Continuous Infusions: . 0.9 % NaCl with KCl 20 mEq / L 20 mL/hr at 11/06/12 [phone]

## 2012-11-06 NOTE — Progress Notes (Signed)
Occupational Therapy Treatment Patient Details Name: [name] MRN: [id number] DOB: October 05, 1929 Today's Date: 11/06/2012 Time: 4098-1191 OT Time Calculation (min): 25 min  OT Assessment / Plan / Recommendation Comments on Treatment Session Pt's LUE much improved.  Pt continues to make progress.  Still with decreased activity tolerance    Follow Up Recommendations  SNF    Barriers to Discharge       Equipment Recommendations       Recommendations for Other Services    Frequency Min 2X/week   Plan Discharge plan remains appropriate    Precautions / Restrictions Restrictions Weight Bearing Restrictions: No   Pertinent Vitals/Pain Stomach/abdomen:  Not rated.  Reapplied warm compress and repositioned.  Pt c/o burning when urinating:  RN alerted    ADL  Grooming: Wash/dry hands;Min guard Where Assessed - Grooming: Supported standing Toilet Transfer: Performed;Minimal Dentist Method: Sit to Barista: Comfort height toilet;Grab bars Toileting - Architect and Hygiene: Min guard Where Assessed - Engineer, mining and Hygiene: Lean right and/or left (hygiene) Equipment Used: Rolling walker Transfers/Ambulation Related to ADLs: ambulated to bathroom with min A ADL Comments: Pt able to make fist almost completely--lacks a little with 2nd and 3rd digits.  No pain in hand, just R elbow    OT Diagnosis:    OT Problem List:   OT Treatment Interventions:     OT Goals Acute Rehab OT Goals Time For Goal Achievement: 11/17/12 ADL Goals Pt Will Perform Grooming: with min assist;Sitting, edge of bed ADL Goal: Grooming - Progress: Met Pt Will Transfer to Toilet: with min assist;Stand pivot transfer;3-in-1 ADL Goal: Toilet Transfer - Progress: Met Pt Will Perform Toileting - Hygiene: with min assist;Sit to stand from 3-in-1/toilet ADL Goal: Toileting - Hygiene - Progress: Progressing toward goals Miscellaneous  OT Goals Miscellaneous OT Goal #1: Pt will perform gentle arom exercises to L hand (possibly in warm water) to encourage edema management and to encourage use OT Goal: Miscellaneous Goal #1 - Progress: Met Miscellaneous OT Goal #2: Pt will not need more than 1 vcs for safety during OT session OT Goal: Miscellaneous Goal #2 - Progress: Progressing toward goals  Visit Information  Last OT Received On: 11/06/12 Assistance Needed: +1    Subjective Data      Prior Functioning       Cognition  Cognition Overall Cognitive Status: Impaired Area of Impairment: Safety/judgement;Awareness of deficits;Awareness of errors Arousal/Alertness: Awake/alert Orientation Level: Appears intact for tasks assessed Behavior During Session: [facility] for tasks performed Safety/Judgement: Decreased safety judgement for tasks assessed    Mobility  Transfers Sit to Stand: 4: Min assist;With armrests;From chair/3-in-1 Details for Transfer Assistance: min cues for hand placement    Exercises      Balance     End of Session OT - End of Session Activity Tolerance: Patient limited by fatigue Patient left: in chair;with call bell/phone within reach  GO     [facility] 11/06/2012, 10:26 AM Marica Otter, OTR/L [phone] 11/06/2012

## 2012-11-06 NOTE — Progress Notes (Signed)
Pt for discharge to [facility].   CSW faxed pt d/c information via [facility], discussed with pt and pt family at bedside, provided RN phone number to call report, and pt husband plans to transport via private vehicle.  No further social work needs identified at this time.  CSW signing off.   Jacklynn Lewis, MSW, LCSWA  Clinical Social Work [phone]

## 2012-11-09 LAB — GLUCOSE, CAPILLARY: Glucose-Capillary: 171 mg/dL — ABNORMAL HIGH (ref 70–99)

## 2012-11-10 ENCOUNTER — Encounter: Payer: Medicare Other | Admitting: Internal Medicine

## 2012-11-16 ENCOUNTER — Other Ambulatory Visit (HOSPITAL_COMMUNITY): Payer: Self-pay | Admitting: Internal Medicine

## 2012-11-16 DIAGNOSIS — R109 Unspecified abdominal pain: Secondary | ICD-10-CM

## 2012-11-16 DIAGNOSIS — R110 Nausea: Secondary | ICD-10-CM

## 2012-11-19 ENCOUNTER — Other Ambulatory Visit (HOSPITAL_COMMUNITY): Payer: Self-pay | Admitting: Internal Medicine

## 2012-11-19 ENCOUNTER — Encounter (HOSPITAL_COMMUNITY): Payer: Self-pay

## 2012-11-19 ENCOUNTER — Ambulatory Visit (HOSPITAL_COMMUNITY)
Admission: RE | Admit: 2012-11-19 | Discharge: 2012-11-19 | Disposition: A | Discharge status: Home / Self Care | Payer: Medicare Other | Source: Ambulatory Visit | Attending: Internal Medicine | Admitting: Internal Medicine

## 2012-11-19 DIAGNOSIS — R112 Nausea with vomiting, unspecified: Secondary | ICD-10-CM | POA: Insufficient documentation

## 2012-11-19 DIAGNOSIS — I77819 Aortic ectasia, unspecified site: Secondary | ICD-10-CM | POA: Insufficient documentation

## 2012-11-19 DIAGNOSIS — J9819 Other pulmonary collapse: Secondary | ICD-10-CM | POA: Insufficient documentation

## 2012-11-19 DIAGNOSIS — J438 Other emphysema: Secondary | ICD-10-CM | POA: Insufficient documentation

## 2012-11-19 DIAGNOSIS — R110 Nausea: Secondary | ICD-10-CM

## 2012-11-19 DIAGNOSIS — R109 Unspecified abdominal pain: Secondary | ICD-10-CM

## 2012-11-19 DIAGNOSIS — I700 Atherosclerosis of aorta: Secondary | ICD-10-CM | POA: Insufficient documentation

## 2012-12-15 ENCOUNTER — Other Ambulatory Visit: Payer: Self-pay | Admitting: Emergency Medicine

## 2012-12-23 ENCOUNTER — Encounter (HOSPITAL_BASED_OUTPATIENT_CLINIC_OR_DEPARTMENT_OTHER): Payer: Medicare Other | Attending: General Surgery

## 2012-12-23 DIAGNOSIS — J4489 Other specified chronic obstructive pulmonary disease: Secondary | ICD-10-CM | POA: Insufficient documentation

## 2012-12-23 DIAGNOSIS — I87319 Chronic venous hypertension (idiopathic) with ulcer of unspecified lower extremity: Secondary | ICD-10-CM | POA: Insufficient documentation

## 2012-12-23 DIAGNOSIS — E119 Type 2 diabetes mellitus without complications: Secondary | ICD-10-CM | POA: Insufficient documentation

## 2012-12-23 DIAGNOSIS — J449 Chronic obstructive pulmonary disease, unspecified: Secondary | ICD-10-CM | POA: Insufficient documentation

## 2012-12-23 DIAGNOSIS — L97909 Non-pressure chronic ulcer of unspecified part of unspecified lower leg with unspecified severity: Secondary | ICD-10-CM | POA: Insufficient documentation

## 2012-12-23 DIAGNOSIS — I252 Old myocardial infarction: Secondary | ICD-10-CM | POA: Insufficient documentation

## 2012-12-23 LAB — GLUCOSE, CAPILLARY: Glucose-Capillary: 210 mg/dL — ABNORMAL HIGH (ref 70–99)

## 2013-01-01 ENCOUNTER — Encounter (INDEPENDENT_AMBULATORY_CARE_PROVIDER_SITE_OTHER): Payer: Medicare Other

## 2013-01-01 DIAGNOSIS — M7989 Other specified soft tissue disorders: Secondary | ICD-10-CM

## 2013-01-01 DIAGNOSIS — R0602 Shortness of breath: Secondary | ICD-10-CM

## 2013-01-01 DIAGNOSIS — R609 Edema, unspecified: Secondary | ICD-10-CM

## 2013-01-01 DIAGNOSIS — M79609 Pain in unspecified limb: Secondary | ICD-10-CM

## 2013-01-05 NOTE — Progress Notes (Unsigned)
Copy of report faxed  To Dr. Ardath Sax

## 2013-01-07 ENCOUNTER — Encounter (INDEPENDENT_AMBULATORY_CARE_PROVIDER_SITE_OTHER): Payer: Medicare Other

## 2013-01-07 DIAGNOSIS — I70219 Atherosclerosis of native arteries of extremities with intermittent claudication, unspecified extremity: Secondary | ICD-10-CM

## 2013-01-07 DIAGNOSIS — I70229 Atherosclerosis of native arteries of extremities with rest pain, unspecified extremity: Secondary | ICD-10-CM

## 2013-01-07 DIAGNOSIS — I739 Peripheral vascular disease, unspecified: Secondary | ICD-10-CM

## 2013-01-22 ENCOUNTER — Encounter (HOSPITAL_BASED_OUTPATIENT_CLINIC_OR_DEPARTMENT_OTHER): Payer: Medicare Other | Attending: General Surgery

## 2013-01-22 DIAGNOSIS — I87319 Chronic venous hypertension (idiopathic) with ulcer of unspecified lower extremity: Secondary | ICD-10-CM | POA: Insufficient documentation

## 2013-01-22 DIAGNOSIS — L97909 Non-pressure chronic ulcer of unspecified part of unspecified lower leg with unspecified severity: Secondary | ICD-10-CM | POA: Insufficient documentation

## 2013-02-11 ENCOUNTER — Other Ambulatory Visit: Payer: Self-pay | Admitting: Gastroenterology

## 2013-02-11 DIAGNOSIS — K257 Chronic gastric ulcer without hemorrhage or perforation: Secondary | ICD-10-CM

## 2013-02-16 ENCOUNTER — Ambulatory Visit
Admission: RE | Admit: 2013-02-16 | Discharge: 2013-02-16 | Disposition: A | Discharge status: Home / Self Care | Payer: Medicare Other | Source: Ambulatory Visit | Attending: Gastroenterology | Admitting: Gastroenterology

## 2013-02-16 ENCOUNTER — Other Ambulatory Visit: Payer: Self-pay | Admitting: Gastroenterology

## 2013-02-16 DIAGNOSIS — K257 Chronic gastric ulcer without hemorrhage or perforation: Secondary | ICD-10-CM

## 2013-03-05 ENCOUNTER — Encounter: Payer: Self-pay | Admitting: Emergency Medicine

## 2013-03-05 ENCOUNTER — Ambulatory Visit (INDEPENDENT_AMBULATORY_CARE_PROVIDER_SITE_OTHER): Payer: Medicare Other | Admitting: Emergency Medicine

## 2013-03-05 VITALS — BP 118/76 | HR 78 | Temp 99.9°F | Ht 64.0 in | Wt 119.6 lb

## 2013-03-05 DIAGNOSIS — J449 Chronic obstructive pulmonary disease, unspecified: Secondary | ICD-10-CM

## 2013-03-05 DIAGNOSIS — R05 Cough: Secondary | ICD-10-CM

## 2013-03-05 MED ORDER — MOMETASONE FUROATE 50 MCG/ACT NA SUSP: 2.0000 | Freq: Every day | NASAL | Status: DC

## 2013-03-05 MED ORDER — LORATADINE 10 MG PO TABS: 10.0000 mg | ORAL_TABLET | Freq: Every day | ORAL | Status: DC

## 2013-03-05 NOTE — Assessment & Plan Note (Signed)
With significant nasal obstruction on exam. Will add nasonex and loratadine today. She may need an ENT eval if this does n't help her nasal obstruction

## 2013-03-05 NOTE — Assessment & Plan Note (Signed)
Her dyspnea seems to relate to nasal obstruction as opposed to her COPD - continue spiriva + xopenex

## 2013-03-05 NOTE — Progress Notes (Signed)
Subjective:    Patient ID: [name], female    DOB: 08/21/1929, 79 y.o.   MRN: [id number]  HPI [name] is a 79 year old woman with COPD that is in the mild to moderate range. She is followed by Dr Gala Romney for congestive heart failure secondary to nonischemic cardiomyopathy. Previous EF was 25-30%. She is status post BiV ICD, EF now 60%. She also has diastolic dysfunction and a history of pulmonary edema. Has OSA but unable to tolerate CPAP. Pacer changed   ROV 03/08/10 -- returns for her COPD, systolic/diastolic CHF, OSA. Since last visit she had reversal of her colostomy, still doesn't have her strength back. She still has cough. Has been using flutter valve, has been trying to practice swallowing precautions. She coughs whenever she takes her Advair. Seems to tolerate the Spiriva. She has had some increased LE edema, currently seems to be at baseline.   ROV 05/14/10 --  moderate COPD, NICM s/p BiV pacer, OSA (not on CPAP). Last time we discussed her cough, stopped Advair and continued Spiriva. Also started fluticasone nasal spray at bedtime. Her cough is better. Hasn't really missed the Advair, breathing is stable.   ROV 10/05/10 -- regular f/u for COPD, also hx systolic and diastolic CHF s/p BiV pacer, OSA not on CPAP. Just had BiV pacer changed on 09/03/10. She returns telling me that her SOB has slowly progressed over last 6 - 12 months. Not able to walk as far, not able to cook or clean. She has been coughing daily, not always productive. Hears wheeze at night and sometimes during the day, better with coughing up phlegm. Currently on Spiriva, stopped Advair. Not taking fluticasone spray. Hasn't tolerated mucinex before.   ROV 11/05/10 -- COPD, systolic/diastolic CHF, biV pacer, OSA not on CPAP. Last time we added Symbicort to Spiriva. Her breathing has benefitted, SABA use has decreased to 2x a day. ? having some palpitations. Still limited with exertion. Wakes up at night and uses the  SABA, helps her. Still coughing phlegm every day. Fluticasone spray may have helped some.   ROV 02/13/11 -- COPD (moderate), systolic and diastolic CHF + BiV pacer, OSA not on CPAP. She is currently on Spiriva + Symbicort. Using SABA about 2x a day. Only using fluticasone nasal occasionally because it was putting sores in her nose - it did help her drainage and cough.  Taking benadryl every night. She is on omeprazole bid.   ROV 03/19/11 -- hx of COPD,  systolic and diastolic CHF + BiV pacer, OSA not on CPAP. Last time we stopped symbicort and continued Spiriva, repeated PFT's. Since last visit she has continued to have dyspnea. On Sunday she had an episode that was associated with CP. She was started on daliresp mid-July by Dr Oneta Rack. She believes that it may have helped her, although her cough is worse. We changed the fluticasone nose spray to nasonex, but she couldn't tolerate due to sores in her nose. She did the [place] x 1, but couldn't continue it. Her PFT today show moderate AFL, little change FEV1 from priors.   ROV 02/06/12 -- Hx of COPD,  systolic and diastolic CHF + BiV pacer, OSA not on CPAP. Has been continue on Spiriva. Was on daliresp, she isn't sure when that was stopped, but she is no longer taking.   ROV 08/24/12 -- Hx of COPD,  systolic and diastolic CHF + BiV pacer, OSA not on CPAP. She is followed by Dr Graciela Husbands for her CHF and pacer -  notes indicate that she is no longer BiV paced, is now only LV paced, changed in 9/13. She states that she is having more exertional SOB with low energy for the last 2 months. She can't stand up to cook - says she feels like she is going to pass out if she stands there too long. She is having chest tightness. She continues to take Spiriva. Has been using xopenex prn with some relief.  Lyrica is new, was started in December for diabetic neuropathy. Daliresp is on her med list, but she doesn't believe she is taking - can't recall what it is  ROV 03/05/13 --  Hx of  COPD,  systolic and diastolic CHF + BiV pacer (no longer paced), OSA not on CPAP. Reports today that her breathing is bothering her, nasal congestion. Has cough that "strangles her".  Admitted in March for bowel obstruction.    Objective:   Physical Exam Filed Vitals:   03/05/13 1123  BP: 118/76  Pulse: 78  Temp:    Gen: Pleasant, well-nourished, in no distress,  normal affect  ENT: No lesions,  mouth clear,  oropharynx clear, no postnasal drip  Neck: No JVD, no TMG, no carotid bruits  Lungs: No use of accessory muscles, no dullness to percussion, clear without rales or rhonchi  Cardiovascular: RRR, heart sounds normal, no murmur or gallops, no peripheral edema  Musculoskeletal: No deformities, no cyanosis or clubbing  Neuro: alert, non focal  Skin: Warm, no lesions or rashes     Assessment & Plan:  COPD Her dyspnea seems to relate to nasal obstruction as opposed to her COPD - continue spiriva + xopenex  COUGH With significant nasal obstruction on exam. Will add nasonex and loratadine today. She may need an ENT eval if this does n't help her nasal obstruction

## 2013-03-05 NOTE — Patient Instructions (Addendum)
Please continue your Spiriva daily Continue your xopenex as needed for shortness of breath Start nasonex nasal spray, 2 sprays each nostril daily Start loratadine 10mg  daily.  Follow with Dr Delton Coombes in 3 months or sooner if you have any problems.

## 2013-05-24 ENCOUNTER — Ambulatory Visit (INDEPENDENT_AMBULATORY_CARE_PROVIDER_SITE_OTHER): Payer: Medicare Other | Admitting: Internal Medicine

## 2013-05-24 ENCOUNTER — Encounter: Payer: Self-pay | Admitting: Internal Medicine

## 2013-05-24 VITALS — BP 160/78 | HR 89 | Ht 64.0 in | Wt 120.0 lb

## 2013-05-24 DIAGNOSIS — I5032 Chronic diastolic (congestive) heart failure: Secondary | ICD-10-CM

## 2013-05-24 DIAGNOSIS — Z950 Presence of cardiac pacemaker: Secondary | ICD-10-CM

## 2013-05-24 DIAGNOSIS — I4891 Unspecified atrial fibrillation: Secondary | ICD-10-CM

## 2013-05-24 DIAGNOSIS — I509 Heart failure, unspecified: Secondary | ICD-10-CM

## 2013-05-24 DIAGNOSIS — I429 Cardiomyopathy, unspecified: Secondary | ICD-10-CM

## 2013-05-24 DIAGNOSIS — R0602 Shortness of breath: Secondary | ICD-10-CM

## 2013-05-24 LAB — PACEMAKER DEVICE OBSERVATION
AL AMPLITUDE: 3.1 mv
AL IMPEDENCE PM: 456 Ohm
BAMS-0001: 170 {beats}/min
BATTERY VOLTAGE: 2.99 V
RV LEAD AMPLITUDE: 3.8 mv
RV LEAD IMPEDENCE PM: 646 Ohm
VENTRICULAR PACING PM: 100

## 2013-05-24 LAB — BRAIN NATRIURETIC PEPTIDE: Pro B Natriuretic peptide (BNP): 90 pg/mL (ref 0.0–100.0)

## 2013-05-24 NOTE — Patient Instructions (Addendum)
Your Physician recommends you have an AV optimization echo.  Your physician recommends that you continue on your current medications as directed. Please refer to the Current Medication list given to you today.  Your physician recommends that you return for lab work today: BNP/TSH

## 2013-05-24 NOTE — Progress Notes (Signed)
sk[facility] Patient Care Team: Lucky Cowboy, MD as PCP - General   HPI  [name] is a 79 y.o. female Seen in followup for congestive heart failure in the setting of nonischemic and ischemic heart disease. She is status post CRT-D generator downgrade to CRT P.-2012.  She had a 6949-lead and we had switched rate sense portions she is intercurrently suffered fracture of the 6949 rate sense lead which was a new LV port. This was inactivated.;   She comes in complaining of worsening shortness of breath   Last recorded TSH was 3/14  Past Medical History  Diagnosis Date  . CONGESTIVE HEART FAILURE 06/03/2007  . COPD 06/03/2007  . HYPERTENSION 06/03/2007  . HYPOTHYROIDISM 06/03/2007  . Hernia   . Asthma   . Diabetes mellitus   . Hyperlipidemia   . Arthritis   . Myocardial infarction   . Osteoporosis     Past Surgical History  Procedure Laterality Date  . Cardiac defibrillator placement    . Appendectomy    . Hysterectomy - unknown type    . Posterior fusion clivus-c2 transarticular stealth guided w/ icbg    . Cystectomy      elbow  . Icd      medtronic maximo  . Polypectomy      bladder  . Esophagogastroduodenoscopy N/A 10/30/2012    Procedure: ESOPHAGOGASTRODUODENOSCOPY (EGD);  Surgeon: Vertell Novak., MD;  Location: [facility];  Service: Endoscopy;  Laterality: N/A;    Current Outpatient Prescriptions  Medication Sig Dispense Refill  . diltiazem (CARDIZEM CD) 240 MG 24 hr capsule Take 240 mg by mouth daily.       . diphenhydrAMINE (BENADRYL) 25 MG tablet Take 50 mg by mouth at bedtime as needed for sleep.       [name] glyBURIDE (DIABETA) 5 MG tablet Take 5 mg by mouth. If glucose > 200.      [name] levothyroxine (SYNTHROID, LEVOTHROID) 112 MCG tablet Take 112 mcg by mouth daily.      . metoCLOPramide (REGLAN) 5 MG tablet Take 5 mg by mouth daily.      [name] omeprazole (PRILOSEC) 20 MG capsule Take 20 mg by mouth 2 (two) times daily.      [name] SPIRIVA HANDIHALER 18 MCG  inhalation capsule inhale the contents of one capsule in the handihaler once daily  30 capsule  6  . levalbuterol (XOPENEX HFA) 45 MCG/ACT inhaler Inhale 1-2 puffs into the lungs every 6 (six) hours as needed for wheezing.  1 Inhaler  3   No current facility-administered medications for this visit.    Allergies  Allergen Reactions  . Ace Inhibitors   . Acetaminophen-Codeine     REACTION: n/v  . Atorvastatin   . Bisoprolol Fumarate   . Ezetimibe-Simvastatin   . Fluvastatin Sodium   . Gabapentin   . Gemfibrozil   . Lisinopril     REACTION: unspecified  . Oxycodone Hcl     REACTION: vomiting  . Rosuvastatin     Review of Systems negative except from HPI and PMH  Physical Exam BP 160/78  Pulse 89  Ht 5\' 4"  (1.626 m)  Wt 120 lb (54.432 kg)  BMI 20.59 kg/m2 Well developed and well nourished in no acute distress HENT normal E scleral and icterus clear Neck Supple JVP flat; carotids brisk and full Clear to ausculation  Regular rate and rhythm, 2/6 murmur at the apex Soft with active bowel sounds No clubbing cyanosis  Edema Alert and oriented, grossly  normal motor and sensory function Skin Warm and Dry  ECG demonstrates P. synchronous pacing with a transition in V5  Assessment and  Plan

## 2013-05-24 NOTE — Assessment & Plan Note (Signed)
rechekcf TSH given fatigue

## 2013-05-24 NOTE — Assessment & Plan Note (Signed)
Continue cardiac resynchronization. With normalization of LV function, if we can't improve things with reprobing of AV delay AV optimization we will consider discontinuing the pacing altogether

## 2013-05-24 NOTE — Assessment & Plan Note (Signed)
The patient's device was interrogated.  The information was reviewed.  We have reprogrammed the AV delay to see if we can help with her dyspnea on exertion

## 2013-05-24 NOTE — Assessment & Plan Note (Signed)
As above.

## 2013-05-24 NOTE — Assessment & Plan Note (Signed)
Paroxysmal

## 2013-05-24 NOTE — Assessment & Plan Note (Signed)
She's having significant problems with shortness of breath. We will empirically reprogram her AV delay undertake an AV optimization echo. She appears to be euvolemic

## 2013-05-26 ENCOUNTER — Other Ambulatory Visit (HOSPITAL_COMMUNITY): Payer: Self-pay | Admitting: Internal Medicine

## 2013-05-26 ENCOUNTER — Ambulatory Visit (HOSPITAL_COMMUNITY)
Admission: RE | Admit: 2013-05-26 | Discharge: 2013-05-26 | Disposition: A | Discharge status: Home / Self Care | Payer: Medicare Other | Source: Ambulatory Visit | Attending: Internal Medicine | Admitting: Internal Medicine

## 2013-05-26 DIAGNOSIS — M545 Low back pain, unspecified: Secondary | ICD-10-CM | POA: Insufficient documentation

## 2013-05-26 DIAGNOSIS — M47817 Spondylosis without myelopathy or radiculopathy, lumbosacral region: Secondary | ICD-10-CM | POA: Insufficient documentation

## 2013-05-26 DIAGNOSIS — R29890 Loss of height: Secondary | ICD-10-CM | POA: Insufficient documentation

## 2013-06-03 ENCOUNTER — Ambulatory Visit (HOSPITAL_COMMUNITY)
Admission: RE | Admit: 2013-06-03 | Discharge: 2013-06-03 | Disposition: A | Discharge status: Home / Self Care | Payer: Medicare Other | Source: Ambulatory Visit | Attending: Internal Medicine | Admitting: Internal Medicine

## 2013-06-03 ENCOUNTER — Other Ambulatory Visit (HOSPITAL_COMMUNITY): Payer: Self-pay | Admitting: Internal Medicine

## 2013-06-03 DIAGNOSIS — J209 Acute bronchitis, unspecified: Secondary | ICD-10-CM

## 2013-06-03 DIAGNOSIS — Z95810 Presence of automatic (implantable) cardiac defibrillator: Secondary | ICD-10-CM | POA: Insufficient documentation

## 2013-06-03 DIAGNOSIS — R079 Chest pain, unspecified: Secondary | ICD-10-CM | POA: Insufficient documentation

## 2013-06-03 DIAGNOSIS — R05 Cough: Secondary | ICD-10-CM | POA: Insufficient documentation

## 2013-06-03 DIAGNOSIS — I517 Cardiomegaly: Secondary | ICD-10-CM | POA: Insufficient documentation

## 2013-06-03 DIAGNOSIS — R072 Precordial pain: Secondary | ICD-10-CM

## 2013-06-03 DIAGNOSIS — R059 Cough, unspecified: Secondary | ICD-10-CM | POA: Insufficient documentation

## 2013-06-03 DIAGNOSIS — R509 Fever, unspecified: Secondary | ICD-10-CM | POA: Insufficient documentation

## 2013-06-07 ENCOUNTER — Other Ambulatory Visit: Payer: Self-pay | Admitting: *Deleted

## 2013-06-07 DIAGNOSIS — I4891 Unspecified atrial fibrillation: Secondary | ICD-10-CM

## 2013-06-14 ENCOUNTER — Other Ambulatory Visit (HOSPITAL_COMMUNITY): Payer: Self-pay | Admitting: Internal Medicine

## 2013-06-14 DIAGNOSIS — I509 Heart failure, unspecified: Secondary | ICD-10-CM

## 2013-06-17 ENCOUNTER — Encounter: Payer: Self-pay | Admitting: Internal Medicine

## 2013-06-17 ENCOUNTER — Ambulatory Visit (HOSPITAL_COMMUNITY): Payer: Medicare Other | Attending: Internal Medicine | Admitting: Radiology

## 2013-06-17 ENCOUNTER — Ambulatory Visit (INDEPENDENT_AMBULATORY_CARE_PROVIDER_SITE_OTHER): Payer: Medicare Other | Admitting: *Deleted

## 2013-06-17 ENCOUNTER — Other Ambulatory Visit (INDEPENDENT_AMBULATORY_CARE_PROVIDER_SITE_OTHER): Payer: Medicare Other

## 2013-06-17 DIAGNOSIS — I429 Cardiomyopathy, unspecified: Secondary | ICD-10-CM

## 2013-06-17 DIAGNOSIS — I5032 Chronic diastolic (congestive) heart failure: Secondary | ICD-10-CM

## 2013-06-17 DIAGNOSIS — I509 Heart failure, unspecified: Secondary | ICD-10-CM

## 2013-06-17 DIAGNOSIS — I4891 Unspecified atrial fibrillation: Secondary | ICD-10-CM

## 2013-06-17 DIAGNOSIS — I428 Other cardiomyopathies: Secondary | ICD-10-CM | POA: Insufficient documentation

## 2013-06-17 LAB — T3, FREE: T3, Free: 2.3 pg/mL (ref 2.3–4.2)

## 2013-06-17 NOTE — Progress Notes (Signed)
Echocardiogram performed.  

## 2013-06-18 LAB — PACEMAKER DEVICE OBSERVATION
AL IMPEDENCE PM: 475 Ohm
ATRIAL PACING PM: 0.2
BATTERY VOLTAGE: 2.98 V
LV LEAD THRESHOLD: 3 V
RV LEAD IMPEDENCE PM: 646 Ohm
VENTRICULAR PACING PM: 100

## 2013-06-18 NOTE — Progress Notes (Signed)
AV opt echo done by industry and AV delays reprogrammed 100/57msec.  LV lead is in RV port.  Follow up as scheduled.

## 2013-08-15 DIAGNOSIS — E1129 Type 2 diabetes mellitus with other diabetic kidney complication: Secondary | ICD-10-CM | POA: Insufficient documentation

## 2013-08-15 DIAGNOSIS — E119 Type 2 diabetes mellitus without complications: Secondary | ICD-10-CM

## 2013-08-16 ENCOUNTER — Encounter: Payer: Self-pay | Admitting: Physician Assistant

## 2013-08-16 ENCOUNTER — Ambulatory Visit (INDEPENDENT_AMBULATORY_CARE_PROVIDER_SITE_OTHER): Payer: Medicare Other | Admitting: Physician Assistant

## 2013-08-16 VITALS — BP 138/72 | HR 84 | Temp 98.1°F | Resp 16 | Ht 64.0 in | Wt 131.0 lb

## 2013-08-16 DIAGNOSIS — D649 Anemia, unspecified: Secondary | ICD-10-CM

## 2013-08-16 DIAGNOSIS — I10 Essential (primary) hypertension: Secondary | ICD-10-CM

## 2013-08-16 DIAGNOSIS — I4891 Unspecified atrial fibrillation: Secondary | ICD-10-CM

## 2013-08-16 DIAGNOSIS — E039 Hypothyroidism, unspecified: Secondary | ICD-10-CM

## 2013-08-16 DIAGNOSIS — E119 Type 2 diabetes mellitus without complications: Secondary | ICD-10-CM

## 2013-08-16 DIAGNOSIS — R0600 Dyspnea, unspecified: Secondary | ICD-10-CM

## 2013-08-16 DIAGNOSIS — Z79899 Other long term (current) drug therapy: Secondary | ICD-10-CM

## 2013-08-16 LAB — CBC WITH DIFFERENTIAL/PLATELET
Basophils Absolute: 0 10*3/uL (ref 0.0–0.1)
Eosinophils Absolute: 0.1 10*3/uL (ref 0.0–0.7)
Eosinophils Relative: 1 % (ref 0–5)
HCT: 33.5 % — ABNORMAL LOW (ref 36.0–46.0)
Hemoglobin: 10.4 g/dL — ABNORMAL LOW (ref 12.0–15.0)
Lymphocytes Relative: 14 % (ref 12–46)
MCHC: 31 g/dL (ref 30.0–36.0)
MCV: 78.1 fL (ref 78.0–100.0)
Monocytes Absolute: 1 10*3/uL (ref 0.1–1.0)
Monocytes Relative: 7 % (ref 3–12)
Platelets: 421 10*3/uL — ABNORMAL HIGH (ref 150–400)
RDW: 17.2 % — ABNORMAL HIGH (ref 11.5–15.5)
WBC: 13 10*3/uL — ABNORMAL HIGH (ref 4.0–10.5)

## 2013-08-16 LAB — LIPID PANEL
HDL: 51 mg/dL (ref >39)
LDL Cholesterol: 127 mg/dL — ABNORMAL HIGH (ref 0–99)
Triglycerides: 146 mg/dL (ref <150)

## 2013-08-16 LAB — IRON AND TIBC: UIBC: 438 ug/dL — ABNORMAL HIGH (ref 125–400)

## 2013-08-16 LAB — FERRITIN: Ferritin: 19 ng/mL (ref 10–291)

## 2013-08-16 LAB — BASIC METABOLIC PANEL WITH GFR
BUN: 16 mg/dL (ref 6–23)
Chloride: 100 mEq/L (ref 96–112)
Creat: 1.59 mg/dL — ABNORMAL HIGH (ref 0.50–1.10)
GFR, Est Non African American: 30 mL/min — ABNORMAL LOW
Glucose, Bld: 141 mg/dL — ABNORMAL HIGH (ref 70–99)
Potassium: 3.8 mEq/L (ref 3.5–5.3)

## 2013-08-16 LAB — HEPATIC FUNCTION PANEL
ALT: <8 U/L (ref 0–35)
AST: 11 U/L (ref 0–37)
Albumin: 4.3 g/dL (ref 3.5–5.2)
Alkaline Phosphatase: 92 U/L (ref 39–117)
Total Protein: 7.3 g/dL (ref 6.0–8.3)

## 2013-08-16 LAB — HEMOGLOBIN A1C
Hgb A1c MFr Bld: 6.1 % — ABNORMAL HIGH (ref <5.7)
Mean Plasma Glucose: 128 mg/dL — ABNORMAL HIGH (ref <117)

## 2013-08-16 LAB — TSH: TSH: 2.649 u[IU]/mL (ref 0.350–4.500)

## 2013-08-16 LAB — MAGNESIUM: Magnesium: 2 mg/dL (ref 1.5–2.5)

## 2013-08-16 MED ORDER — AMITRIPTYLINE HCL 10 MG PO TABS: 10.0000 mg | ORAL_TABLET | Freq: Two times a day (BID) | ORAL | Status: DC | PRN

## 2013-08-16 MED ORDER — AZITHROMYCIN 250 MG PO TABS: ORAL_TABLET | ORAL | Status: AC

## 2013-08-16 MED ORDER — NEOMYCIN-POLYMYXIN-HC 1 % OT SOLN: 3.0000 [drp] | Freq: Four times a day (QID) | OTIC | Status: DC

## 2013-08-16 NOTE — Patient Instructions (Signed)
Stop Gabapentin and try amitriptyline 10mg  at night and then can add on a pill during the morning.  Please get on Zpak for your lungs.   Restless Legs Syndrome Restless legs syndrome is a movement disorder. It may also be called a sensori-motor disorder.  CAUSES  No one knows what specifically causes restless legs syndrome, but it tends to run in families. It is also more common in people with low iron, in pregnancy, in people who need dialysis, and those with nerve damage (neuropathy).Some medications may make restless legs syndrome worse.Those medications include drugs to treat high blood pressure, some heart conditions, nausea, colds, allergies, and depression. SYMPTOMS Symptoms include uncomfortable sensations in the legs. These leg sensations are worse during periods of inactivity or rest. They are also worse while sitting or lying down. Individuals that have the disorder describe sensations in the legs that feel like:  Pulling.  Drawing.  Crawling.  Worming.  Boring.  Tingling.  Pins and needles.  Prickling.  Pain. The sensations are usually accompanied by an overwhelming urge to move the legs. Sudden muscle jerks may also occur. Movement provides temporary relief from the discomfort. In rare cases, the arms may also be affected. Symptoms may interfere with going to sleep (sleep onset insomnia). Restless legs syndrome may also be related to periodic limb movement disorder (PLMD). PLMD is another more common motor disorder. It also causes interrupted sleep. The symptoms from PLMD usually occur most often when you are awake. TREATMENT  Treatment for restless legs syndrome is symptomatic. This means that the symptoms are treated.   Massage and cold compresses may provide temporary relief.  Walk, stretch, or take a cold or hot bath.  Get regular exercise and a good night's sleep.  Avoid caffeine, alcohol, nicotine, and medications that can make it worse.  Do activities that  provide mental stimulation like discussions, needlework, and video games. These may be helpful if you are not able to walk or stretch. Some medications are effective in relieving the symptoms. However, many of these medications have side effects. Ask your caregiver about medications that may help your symptoms. Correcting iron deficiency may improve symptoms for some patients. Document Released: 07/26/2002 Document Revised: 10/28/2011 Document Reviewed: 11/01/2010 [facility] Patient Information 2014 [place], [place].

## 2013-08-16 NOTE — Progress Notes (Signed)
HPI Patient presents for 3 month follow up with hypertension, hyperlipidemia, diabetes and vitamin D. Patient's blood pressure has been controlled at home.  Patient denies chest pain, shortness of breath, dizziness.  Patient's cholesterol is diet controlled.The cholesterol last visit was LDL 122 The patient has been working on diet and exercise for Diabetes, and denies changes in vision, polys, and paresthesias. A1C 6.4 She has had a productive cough with wheezing since thanksgiving, denies fevers. She has had 8 lb weight gain and history of CHF. No edema, denies PND, orthopnea.  Patient states that she has not been feeling well since christmas. She states bilateral legs are shaking and spasms, they feel very funny in her legs and has pain in her feet and joints. Feel numb/tingling. Better when she props her feet up. She has not fallen and she feels generally weak. She is on Gabepentin and she states her spasms are better when she is off it.  Anemia with H/H going from 14.8 to 11.0 to 10.4 and we will repeat this today. She is on iron.  Current Medications:  Current Outpatient Prescriptions on File Prior to Visit  Medication Sig Dispense Refill  . diltiazem (CARDIZEM CD) 240 MG 24 hr capsule Take 240 mg by mouth daily.       . diphenhydrAMINE (BENADRYL) 25 MG tablet Take 50 mg by mouth at bedtime as needed for sleep.       [name] glyBURIDE (DIABETA) 5 MG tablet Take 5 mg by mouth. If glucose > 200.      [name] levothyroxine (SYNTHROID, LEVOTHROID) 112 MCG tablet Take 112 mcg by mouth daily.      . metoCLOPramide (REGLAN) 5 MG tablet Take 5 mg by mouth daily.      [name] omeprazole (PRILOSEC) 20 MG capsule Take 20 mg by mouth 2 (two) times daily.      [name] SPIRIVA HANDIHALER 18 MCG inhalation capsule inhale the contents of one capsule in the handihaler once daily  30 capsule  6  . levalbuterol (XOPENEX HFA) 45 MCG/ACT inhaler Inhale 1-2 puffs into the lungs every 6 (six) hours as needed for wheezing.  1 Inhaler  3    No current facility-administered medications on file prior to visit.   Medical History:  Past Medical History  Diagnosis Date  . CONGESTIVE HEART FAILURE 06/03/2007  . COPD 06/03/2007  . HYPERTENSION 06/03/2007  . HYPOTHYROIDISM 06/03/2007  . Hernia   . Asthma   . Diabetes mellitus   . Hyperlipidemia   . Arthritis   . Myocardial infarction   . Osteoporosis    Allergies:  Allergies  Allergen Reactions  . Ace Inhibitors   . Acetaminophen-Codeine     REACTION: n/v  . Atorvastatin   . Bisoprolol Fumarate   . Ezetimibe-Simvastatin   . Fluvastatin Sodium   . Gabapentin   . Gemfibrozil   . Lisinopril     REACTION: unspecified  . Oxycodone Hcl     REACTION: vomiting  . Rosuvastatin     ROS Constitutional: + fatigue, weight gain Denies fever, chills, headaches, insomnia, night sweats Eyes: Denies redness, blurred vision, diplopia, discharge, itchy, watery eyes.  ENT: Denies congestion, post nasal drip, sore throat, earache, dental pain, Tinnitus, Vertigo, Sinus pain, snoring.  Cardio: Denies chest pain, palpitations, irregular heartbeat, dyspnea, diaphoresis, orthopnea, PND, claudication, edema Respiratory: + cough, shortness of breath denies wheezing.  Gastrointestinal: Denies dysphagia, heartburn, AB pain/ cramps, N/V, diarrhea, constipation, hematemesis, melena, hematochezia,  hemorrhoids Genitourinary: Denies dysuria, frequency, urgency, nocturia, hesitancy,  discharge, hematuria, flank pain Musculoskeletal: + myalgia Denies myalgia, stiffness, pain, swelling and strain/sprain. Skin: Denies pruritis, rash, changing in skin lesion Neuro: + paresthesia and weakness  Denies  tremor, incoordination, spasms, pain Psychiatric: Denies confusion, memory loss, sensory loss Endocrine: Denies change in weight, skin, hair change, nocturia Diabetic Polys, Denies visual blurring, hyper /hypo glycemic episodes, and paresthesia, Heme/Lymph: Denies Excessive bleeding, bruising, enlarged  lymph nodes  Family history- Review and unchanged Social history- Review and unchanged Physical Exam: Filed Vitals:   08/16/13 1332  BP: 138/72  Pulse: 84  Temp: 98.1 F (36.7 C)  Resp: 16   Filed Weights   08/16/13 1332  Weight: 131 lb (59.421 kg)   General Appearance: Well nourished, in no apparent distress. Eyes: PERRLA, EOMs, conjunctiva no swelling or erythema Sinuses: No Frontal/maxillary tenderness ENT/Mouth: Ext aud canals clear, TMs without erythema, bulging. No erythema, swelling, or exudate on post pharynx.  Tonsils not swollen or erythematous. Hearing normal.  Neck: Supple, thyroid normal., no JVD Respiratory: Respiratory effort normal, BS equal bilaterally with diffuse wheezing without rales, rhonchi, or stridor.  Cardio: RRR with no MRGs. Brisk peripheral pulses without edema.  Abdomen: Soft, + BS.  Non tender, no guarding, rebound, hernias, masses. Lymphatics: Non tender without lymphadenopathy.  Musculoskeletal: Full ROM, 4/5 strength bilateral.  Skin: Warm, dry without rashes, lesions, ecchymosis.  Neuro: Cranial nerves intact. No cerebellar symptoms. Sensation intact.  Psych: Awake and oriented X 3, normal affect, Insight and Judgment appropriate.   Assessment and Plan:  Hypertension: Continue medication, monitor blood pressure at home.  Continue DASH diet. Cholesterol: Continue diet and exercise. Check cholesterol.  Diabetes-Continue diet and exercise. Check A1C Vitamin D Def- check level and continue medications.  Leg paraesthias/RLS?- ? From anemia with fatigue-check labs including iron, CBC, magnesium. Stop Gabapentin and try amitriptyline 10mg  at night and then can add on during the day.  Dry ears- cortisporin drops  Bronchitis r/o CHF- zpak with refill and check BNP.  Continue diet and meds as discussed. Further disposition pending results of labs. Discussed med's effects and SE's.    [name] 1:44 PM

## 2013-08-17 ENCOUNTER — Other Ambulatory Visit: Payer: Self-pay | Admitting: Emergency Medicine

## 2013-08-17 LAB — INSULIN, FASTING: Insulin fasting, serum: 45 u[IU]/mL — ABNORMAL HIGH (ref 3–28)

## 2013-08-17 LAB — BRAIN NATRIURETIC PEPTIDE: Brain Natriuretic Peptide: 92.9 pg/mL (ref 0.0–100.0)

## 2013-08-23 ENCOUNTER — Telehealth: Payer: Self-pay

## 2013-08-23 NOTE — Telephone Encounter (Signed)
Return call to patient , she states that she has had problems sleeping since starting the Amitriptyline 10 mg, per Marchelle Folks advised patient to stop medication, patient advised and understood

## 2013-08-25 ENCOUNTER — Ambulatory Visit (INDEPENDENT_AMBULATORY_CARE_PROVIDER_SITE_OTHER): Payer: Medicare Other | Admitting: *Deleted

## 2013-08-25 DIAGNOSIS — I5022 Chronic systolic (congestive) heart failure: Secondary | ICD-10-CM

## 2013-08-25 DIAGNOSIS — I428 Other cardiomyopathies: Secondary | ICD-10-CM

## 2013-08-25 DIAGNOSIS — I4891 Unspecified atrial fibrillation: Secondary | ICD-10-CM

## 2013-08-25 LAB — MDC_IDC_ENUM_SESS_TYPE_REMOTE
Battery Remaining Longevity: 39 mo
Brady Statistic AP VP Percent: 0.03 %
Brady Statistic AP VS Percent: 0 %
Brady Statistic AS VS Percent: 0.05 %
Date Time Interrogation Session: 20150107121155
Lead Channel Impedance Value: 4047 Ohm
Lead Channel Impedance Value: 475 Ohm
Lead Channel Impedance Value: 494 Ohm
Lead Channel Impedance Value: 532 Ohm
Lead Channel Impedance Value: 646 Ohm
Lead Channel Pacing Threshold Amplitude: 0.625 V
Lead Channel Pacing Threshold Amplitude: 2.25 V
Lead Channel Pacing Threshold Pulse Width: 0.4 ms
Lead Channel Sensing Intrinsic Amplitude: 3.75 mV
Lead Channel Sensing Intrinsic Amplitude: 8.125 mV
Lead Channel Setting Pacing Pulse Width: 0.4 ms
MDC IDC MSMT BATTERY VOLTAGE: 2.98 V
MDC IDC MSMT LEADCHNL LV IMPEDANCE VALUE: 4047 Ohm
MDC IDC MSMT LEADCHNL LV IMPEDANCE VALUE: 4047 Ohm
MDC IDC MSMT LEADCHNL RA IMPEDANCE VALUE: 361 Ohm
MDC IDC MSMT LEADCHNL RV IMPEDANCE VALUE: 437 Ohm
MDC IDC MSMT LEADCHNL RV PACING THRESHOLD PULSEWIDTH: 0.4 ms
MDC IDC SET LEADCHNL RA PACING AMPLITUDE: 2 V
MDC IDC SET LEADCHNL RV PACING AMPLITUDE: 4.5 V
MDC IDC SET LEADCHNL RV SENSING SENSITIVITY: 0.9 mV
MDC IDC SET ZONE DETECTION INTERVAL: 350 ms
MDC IDC STAT BRADY AS VP PERCENT: 99.93 %
MDC IDC STAT BRADY RA PERCENT PACED: 0.03 %
MDC IDC STAT BRADY RV PERCENT PACED: 99.95 %
Zone Setting Detection Interval: 400 ms

## 2013-08-30 ENCOUNTER — Encounter: Payer: Self-pay | Admitting: Internal Medicine

## 2013-08-30 ENCOUNTER — Ambulatory Visit (INDEPENDENT_AMBULATORY_CARE_PROVIDER_SITE_OTHER): Payer: Medicare Other | Admitting: Internal Medicine

## 2013-08-30 VITALS — BP 164/81 | HR 87 | Ht 64.0 in | Wt 128.5 lb

## 2013-08-30 DIAGNOSIS — Z950 Presence of cardiac pacemaker: Secondary | ICD-10-CM

## 2013-08-30 DIAGNOSIS — T82190A Other mechanical complication of cardiac electrode, initial encounter: Secondary | ICD-10-CM | POA: Insufficient documentation

## 2013-08-30 DIAGNOSIS — I429 Cardiomyopathy, unspecified: Secondary | ICD-10-CM

## 2013-08-30 DIAGNOSIS — I428 Other cardiomyopathies: Secondary | ICD-10-CM | POA: Insufficient documentation

## 2013-08-30 DIAGNOSIS — I509 Heart failure, unspecified: Secondary | ICD-10-CM

## 2013-08-30 DIAGNOSIS — J449 Chronic obstructive pulmonary disease, unspecified: Secondary | ICD-10-CM

## 2013-08-30 DIAGNOSIS — I4891 Unspecified atrial fibrillation: Secondary | ICD-10-CM

## 2013-08-30 DIAGNOSIS — Z5189 Encounter for other specified aftercare: Secondary | ICD-10-CM

## 2013-08-30 DIAGNOSIS — I10 Essential (primary) hypertension: Secondary | ICD-10-CM

## 2013-08-30 LAB — MDC_IDC_ENUM_SESS_TYPE_INCLINIC
Brady Statistic AP VS Percent: 0 %
Brady Statistic AS VP Percent: 99.93 %
Brady Statistic AS VS Percent: 0.04 %
Brady Statistic RA Percent Paced: 0.03 %
Date Time Interrogation Session: 20150112145555
Lead Channel Impedance Value: 380 Ohm
Lead Channel Impedance Value: 4047 Ohm
Lead Channel Impedance Value: 494 Ohm
Lead Channel Impedance Value: 513 Ohm
Lead Channel Impedance Value: 570 Ohm
Lead Channel Impedance Value: 646 Ohm
Lead Channel Pacing Threshold Amplitude: 0.5 V
Lead Channel Pacing Threshold Amplitude: 5.5 V
Lead Channel Pacing Threshold Pulse Width: 0.4 ms
Lead Channel Pacing Threshold Pulse Width: 0.4 ms
Lead Channel Pacing Threshold Pulse Width: 0.8 ms
Lead Channel Sensing Intrinsic Amplitude: 3.75 mV
Lead Channel Setting Pacing Amplitude: 3.5 V
Lead Channel Setting Pacing Pulse Width: 0.8 ms
Lead Channel Setting Sensing Sensitivity: 0.9 mV
MDC IDC MSMT BATTERY REMAINING LONGEVITY: 35 mo
MDC IDC MSMT BATTERY VOLTAGE: 2.98 V
MDC IDC MSMT LEADCHNL LV IMPEDANCE VALUE: 4047 Ohm
MDC IDC MSMT LEADCHNL LV IMPEDANCE VALUE: 4047 Ohm
MDC IDC MSMT LEADCHNL RV IMPEDANCE VALUE: 456 Ohm
MDC IDC MSMT LEADCHNL RV PACING THRESHOLD AMPLITUDE: 1.75 V
MDC IDC MSMT LEADCHNL RV SENSING INTR AMPL: 11.625 mV
MDC IDC SET LEADCHNL RA PACING AMPLITUDE: 2 V
MDC IDC SET ZONE DETECTION INTERVAL: 350 ms
MDC IDC SET ZONE DETECTION INTERVAL: 400 ms
MDC IDC STAT BRADY AP VP PERCENT: 0.03 %
MDC IDC STAT BRADY RV PERCENT PACED: 99.95 %

## 2013-08-30 MED ORDER — CARVEDILOL 6.25 MG PO TABS: 6.2500 mg | ORAL_TABLET | Freq: Two times a day (BID) | ORAL | Status: DC

## 2013-08-30 MED ORDER — AMLODIPINE BESYLATE 5 MG PO TABS: 5.0000 mg | ORAL_TABLET | Freq: Every day | ORAL | Status: AC

## 2013-08-30 MED ORDER — AMLODIPINE BESYLATE 10 MG PO TABS: 10.0000 mg | ORAL_TABLET | Freq: Every day | ORAL | Status: DC

## 2013-08-30 NOTE — Assessment & Plan Note (Signed)
No significant interval atrial fibrillation

## 2013-08-30 NOTE — Patient Instructions (Addendum)
Your physician has recommended you make the following change in your medication:  1) Stop Diltiazem 2) Start Amlodipine 5 mg daily 3) Start Coreg 6.25 mg twice daily  Schedule follow up with Dr. Delton Coombes in the next 4 weeks.  Your physician wants you to follow-up in: 3 months with Rick Duff, PAC.  You will receive a reminder letter in the mail two months in advance. If you don't receive a letter, please call our office to schedule the follow-up appointment.  Your physician wants you to follow-up in: 1 year with Dr. Graciela Husbands.  You will receive a reminder letter in the mail two months in advance. If you don't receive a letter, please call our office to schedule the follow-up appointment.  Remote monitoring is used to monitor your Pacemaker of ICD from home. This monitoring reduces the number of office visits required to check your device to one time per year. It allows [place] to keep an eye on the functioning of your device to ensure it is working properly. You are scheduled for a device check from home on 12/01/2013. You may send your transmission at any time that day. If you have a wireless device, the transmission will be sent automatically. After your physician reviews your transmission, you will receive a postcard with your next transmission date.

## 2013-08-30 NOTE — Assessment & Plan Note (Signed)
Patient is euvolemic. There has been significant interval LV function. We will discontinue her diltiazem and beign   carvedilol. Intolerant of ACE inhibitors and/or ARBs  we will also use intermediate dose amlodipine for blood pressure.

## 2013-08-30 NOTE — Assessment & Plan Note (Signed)
Lead is inactive.

## 2013-08-30 NOTE — Assessment & Plan Note (Signed)
The patient's device was interrogated.  The information was reviewed. No changes were made in the programming.    

## 2013-08-30 NOTE — Assessment & Plan Note (Signed)
Will reestablish followup with Dr Delton Coombes

## 2013-08-30 NOTE — Assessment & Plan Note (Signed)
As abovea

## 2013-08-30 NOTE — Progress Notes (Signed)
skkf      Patient Care Team: Lucky CowboyWilliam McKeown, MD as PCP - General   HPI  [name] is a 80 y.o. female [name] is a 80 y.o. female  Seen in followup for congestive heart failure in the setting of nonischemic and ischemic heart disease. She is status post CRT-D generator downgrade to CRT P.-2012. She had a 6949-lead and we had switched rate sense portions. she has intercurrently suffered fracture of the 6949 rate sense lead which was a new LV port.  Interval worsening of her LV function with a decrease from 60--30% from 3/14--10/14  She has had complaints of DOE which we attempted to address with changing of AV delay; she does not recall if there was improvement or not. Overall she continues to have complaints of fatigue and dyspnea on exertion  Catheterization 2005 demonstrated modest depression of LV function EF of 35% without significant obstructive coronary disease  Past Medical History  Diagnosis Date  . CONGESTIVE HEART FAILURE 06/03/2007  . COPD 06/03/2007  . HYPERTENSION 06/03/2007  . HYPOTHYROIDISM 06/03/2007  . Hernia   . Asthma   . Diabetes mellitus   . Hyperlipidemia   . Arthritis   . Myocardial infarction   . Osteoporosis     Past Surgical History  Procedure Laterality Date  . Cardiac defibrillator placement    . Appendectomy    . Hysterectomy - unknown type    . Posterior fusion clivus-c2 transarticular stealth guided w/ icbg    . Cystectomy      elbow  . Icd      medtronic maximo  . Polypectomy      bladder  . Esophagogastroduodenoscopy N/A 10/30/2012    Procedure: ESOPHAGOGASTRODUODENOSCOPY (EGD);  Surgeon: Vertell NovakJames L Edwards Jr., MD;  Location: [facility];  Service: Endoscopy;  Laterality: N/A;    Current Outpatient Prescriptions  Medication Sig Dispense Refill  . diltiazem (CARDIZEM CD) 240 MG 24 hr capsule Take 240 mg by mouth daily.       . diphenhydrAMINE (BENADRYL) 25 MG tablet Take 50 mg by mouth at bedtime as needed  for sleep.       [name]. glyBURIDE (DIABETA) 5 MG tablet Take 5 mg by mouth. If glucose > 200.      [name]. levalbuterol (XOPENEX HFA) 45 MCG/ACT inhaler Inhale 1-2 puffs into the lungs every 6 (six) hours as needed for wheezing.  1 Inhaler  3  . levothyroxine (SYNTHROID, LEVOTHROID) 112 MCG tablet Take 112 mcg by mouth daily.      . metoCLOPramide (REGLAN) 5 MG tablet Take 5 mg by mouth daily.      [name]. omeprazole (PRILOSEC) 20 MG capsule Take 20 mg by mouth 2 (two) times daily.      [name]. SPIRIVA HANDIHALER 18 MCG inhalation capsule inhale the contents of one capsule in the handihaler once daily  30 capsule  6   No current facility-administered medications for this visit.    Allergies  Allergen Reactions  . Ace Inhibitors   . Acetaminophen-Codeine     REACTION: n/v  . Atorvastatin   . Bisoprolol Fumarate   . Ezetimibe-Simvastatin   . Fluvastatin Sodium   . Gabapentin   . Gemfibrozil   . Lisinopril     REACTION: unspecified  . Oxycodone Hcl     REACTION: vomiting  . Rosuvastatin     Review of Systems negative except from HPI and PMH  Physical Exam BP 164/81  Pulse 87  Ht 5\' 4"  (1.626  m)  Wt 128 lb 8 oz (58.287 kg)  BMI 22.05 kg/m2  SpO2 95% Well developed and well nourished in no acute distress sitting in wheel chair HENT normal E scleral and icterus clear Neck Supple JVP flat; carotids brisk and full Clear to ausculation  Regular rate and rhythm, no murmurs gallops or rub Soft with active bowel sounds No clubbing cyanosis no Edema Alert and oriented, grossly normal motor and sensory function Skin Warm and Dry  ECG>> P. synchronous pacing  Assessment and  Plan

## 2013-09-02 ENCOUNTER — Encounter: Payer: Self-pay | Admitting: Internal Medicine

## 2013-09-15 ENCOUNTER — Ambulatory Visit (INDEPENDENT_AMBULATORY_CARE_PROVIDER_SITE_OTHER): Payer: Medicare Other | Admitting: Physician Assistant

## 2013-09-15 ENCOUNTER — Encounter: Payer: Self-pay | Admitting: Physician Assistant

## 2013-09-15 VITALS — BP 128/68 | HR 88 | Temp 98.1°F | Resp 16 | Wt 124.0 lb

## 2013-09-15 DIAGNOSIS — M79609 Pain in unspecified limb: Secondary | ICD-10-CM

## 2013-09-15 DIAGNOSIS — I10 Essential (primary) hypertension: Secondary | ICD-10-CM

## 2013-09-15 DIAGNOSIS — M79604 Pain in right leg: Secondary | ICD-10-CM

## 2013-09-15 DIAGNOSIS — Z79899 Other long term (current) drug therapy: Secondary | ICD-10-CM

## 2013-09-15 DIAGNOSIS — M79605 Pain in left leg: Secondary | ICD-10-CM

## 2013-09-15 LAB — CBC WITH DIFFERENTIAL/PLATELET
BASOS PCT: 0 % (ref 0–1)
Basophils Absolute: 0 10*3/uL (ref 0.0–0.1)
Eosinophils Absolute: 0.2 10*3/uL (ref 0.0–0.7)
Eosinophils Relative: 2 % (ref 0–5)
HEMATOCRIT: 33.8 % — AB (ref 36.0–46.0)
Hemoglobin: 10.6 g/dL — ABNORMAL LOW (ref 12.0–15.0)
LYMPHS ABS: 1.4 10*3/uL (ref 0.7–4.0)
Lymphocytes Relative: 16 % (ref 12–46)
MCH: 24.7 pg — AB (ref 26.0–34.0)
MCHC: 31.4 g/dL (ref 30.0–36.0)
MCV: 78.6 fL (ref 78.0–100.0)
MONO ABS: 0.7 10*3/uL (ref 0.1–1.0)
Monocytes Relative: 7 % (ref 3–12)
Neutro Abs: 7 10*3/uL (ref 1.7–7.7)
Neutrophils Relative %: 75 % (ref 43–77)
Platelets: 397 10*3/uL (ref 150–400)
RBC: 4.3 MIL/uL (ref 3.87–5.11)
RDW: 17.4 % — ABNORMAL HIGH (ref 11.5–15.5)
WBC: 9.2 10*3/uL (ref 4.0–10.5)

## 2013-09-15 MED ORDER — CLONAZEPAM 0.5 MG PO TABS: ORAL_TABLET | ORAL | Status: DC

## 2013-09-15 MED ORDER — PREGABALIN 75 MG PO CAPS: 75.0000 mg | ORAL_CAPSULE | Freq: Two times a day (BID) | ORAL | Status: DC

## 2013-09-15 NOTE — Patient Instructions (Addendum)
  In addition you need to try 1/2-1 of the klonopin pills I will also put in a referral to a vascular doctor.   Restless Legs Syndrome Restless legs syndrome is a movement disorder. It may also be called a sensori-motor disorder.  CAUSES  No one knows what specifically causes restless legs syndrome, but it tends to run in families. It is also more common in people with low iron, in pregnancy, in people who need dialysis, and those with nerve damage (neuropathy).Some medications may make restless legs syndrome worse.Those medications include drugs to treat high blood pressure, some heart conditions, nausea, colds, allergies, and depression. SYMPTOMS Symptoms include uncomfortable sensations in the legs. These leg sensations are worse during periods of inactivity or rest. They are also worse while sitting or lying down. Individuals that have the disorder describe sensations in the legs that feel like:  Pulling.  Drawing.  Crawling.  Worming.  Boring.  Tingling.  Pins and needles.  Prickling.  Pain. The sensations are usually accompanied by an overwhelming urge to move the legs. Sudden muscle jerks may also occur. Movement provides temporary relief from the discomfort. In rare cases, the arms may also be affected. Symptoms may interfere with going to sleep (sleep onset insomnia). Restless legs syndrome may also be related to periodic limb movement disorder (PLMD). PLMD is another more common motor disorder. It also causes interrupted sleep. The symptoms from PLMD usually occur most often when you are awake. TREATMENT  Treatment for restless legs syndrome is symptomatic. This means that the symptoms are treated.   Massage and cold compresses may provide temporary relief.  Walk, stretch, or take a cold or hot bath.  Get regular exercise and a good night's sleep.  Avoid caffeine, alcohol, nicotine, and medications that can make it worse.  Do activities that provide mental  stimulation like discussions, needlework, and video games. These may be helpful if you are not able to walk or stretch. Some medications are effective in relieving the symptoms. However, many of these medications have side effects. Ask your caregiver about medications that may help your symptoms. Correcting iron deficiency may improve symptoms for some patients. Document Released: 07/26/2002 Document Revised: 10/28/2011 Document Reviewed: 11/01/2010 [facility] Patient Information 2014 [place], [place].

## 2013-09-15 NOTE — Progress Notes (Signed)
HPI Patient presents for a one month follow up.  Last visit she was treated for a bronchitis with elevated WBC, she states that her breathing is better. She also complains of bilateral leg pain, she was put on amitriptylene which did not help. She states that propping up her legs help. They feel painful, achy, and they "Just move". She also complains of weakness in her legs and she can barely stand on them due to the pain. She has been tried on several medications and is intolerant to several things including lyrica, tramadol, codeine, hydrocodone, gabapentin. She has since seen her cardiologist in the mean time for CHF and pacemaker interrogation. She has had a progression of her CHF so her cardiologist has stopped her diltiazem and put her on Coreg BID and Norvasc PRN for BP.   Lab Results  Component Value Date   WBC 13.0* 08/16/2013   HGB 10.4* 08/16/2013   HCT 33.5* 08/16/2013   MCV 78.1 08/16/2013   PLT 421* 08/16/2013   Lab Results  Component Value Date   IRON 28* 08/16/2013   TIBC 466 08/16/2013   FERRITIN 19 08/16/2013    Past Medical History  Diagnosis Date  . CONGESTIVE HEART FAILURE 06/03/2007  . COPD 06/03/2007  . HYPERTENSION 06/03/2007  . HYPOTHYROIDISM 06/03/2007  . Hernia   . Asthma   . Diabetes mellitus   . Hyperlipidemia   . Arthritis   . Osteoporosis   . Cardiomyopathy, nonischemic     Irritable ejection fractions most recently EF 60% 3/14>> EF 30% 10/14  . Biventricular pacemaker -Medtronic]     Downgrade from CRT-D; LV and RV rate sense portions switched with subsequent fracture of morning rate sense portion in the LV port  . 6949-lead     Rate sense no LV port with subsequent fracture; high-voltage inactivated following down rate     Allergies  Allergen Reactions  . Ace Inhibitors   . Acetaminophen-Codeine     REACTION: n/v  . Atorvastatin   . Bisoprolol Fumarate   . Ezetimibe-Simvastatin   . Fluvastatin Sodium   . Gabapentin   . Gemfibrozil   .  Lisinopril     REACTION: unspecified  . Oxycodone Hcl     REACTION: vomiting  . Rosuvastatin       Current Outpatient Prescriptions on File Prior to Visit  Medication Sig Dispense Refill  . amLODipine (NORVASC) 5 MG tablet Take 1 tablet (5 mg total) by mouth daily.  30 tablet  11  . carvedilol (COREG) 6.25 MG tablet Take 1 tablet (6.25 mg total) by mouth 2 (two) times daily.  60 tablet  6  . diphenhydrAMINE (BENADRYL) 25 MG tablet Take 50 mg by mouth at bedtime as needed for sleep.       [name] glyBURIDE (DIABETA) 5 MG tablet Take 5 mg by mouth. If glucose > 200.      [name] levothyroxine (SYNTHROID, LEVOTHROID) 112 MCG tablet Take 112 mcg by mouth daily.      . metoCLOPramide (REGLAN) 5 MG tablet Take 5 mg by mouth daily.      [name] omeprazole (PRILOSEC) 20 MG capsule Take 20 mg by mouth 2 (two) times daily.      [name] SPIRIVA HANDIHALER 18 MCG inhalation capsule inhale the contents of one capsule in the handihaler once daily  30 capsule  6  . levalbuterol (XOPENEX HFA) 45 MCG/ACT inhaler Inhale 1-2 puffs into the lungs every 6 (six) hours as needed for wheezing.  1 Inhaler  3   No current facility-administered medications on file prior to visit.    ROS: all negative expect above.   Physical: Filed Weights   09/15/13 1330  Weight: 124 lb (56.246 kg)   Filed Vitals:   09/15/13 1330  BP: 128/68  Pulse: 88  Temp: 98.1 F (36.7 C)  Resp: 16   General Appearance: Patient appears anxious and her legs are jumping around in the wheel chair.  Eyes: PERRLA, EOMs. Sinuses: No Frontal/maxillary tenderness ENT/Mouth: Ext aud canals clear, normal light reflex with TMs without erythema, bulging. Post pharynx without erythema, swelling, exudate.  Respiratory: CTAB Cardio: RRR, no murmurs, rubs or gallops. Peripheral pulses are decreased bilaterally, no edema, she has mild muscular atrophy bilaterally.  Abdomen: Soft, with bowl sounds. Nontender, no guarding, rebound. Lymphatics: Non tender without  lymphadenopathy.  Musculoskeletal: Full ROM all peripheral extremities, strength no assessed, patient in wheelchair.  Skin: Warm, dry without rashes, lesions, ecchymosis.  Neuro: Cranial nerves intact. Reflexes decreased and difficult exam with pain.  Pysch: Awake and oriented X 3.  Assessment and Plan: Diabetic Neuropathy vs RLS- several medications have been tried- will try Klonopin 0.5 at night for pain/?anxiety    ? Any vascular component to it- has had ABI, will refer to vascular.   Leukocytosis- recheck CBC

## 2013-09-16 LAB — HEPATIC FUNCTION PANEL
ALK PHOS: 94 U/L (ref 39–117)
ALT: 11 U/L (ref 0–35)
AST: 16 U/L (ref 0–37)
Albumin: 3.7 g/dL (ref 3.5–5.2)
TOTAL PROTEIN: 6.6 g/dL (ref 6.0–8.3)
Total Bilirubin: 0.3 mg/dL (ref 0.2–1.2)

## 2013-09-16 LAB — BASIC METABOLIC PANEL WITH GFR
BUN: 14 mg/dL (ref 6–23)
CHLORIDE: 101 meq/L (ref 96–112)
CO2: 25 mEq/L (ref 19–32)
Calcium: 8.8 mg/dL (ref 8.4–10.5)
Creat: 1.39 mg/dL — ABNORMAL HIGH (ref 0.50–1.10)
GFR, EST AFRICAN AMERICAN: 41 mL/min — AB
GFR, Est Non African American: 35 mL/min — ABNORMAL LOW
GLUCOSE: 177 mg/dL — AB (ref 70–99)
Potassium: 3.9 mEq/L (ref 3.5–5.3)
Sodium: 136 mEq/L (ref 135–145)

## 2013-09-16 LAB — MAGNESIUM: Magnesium: 1.8 mg/dL (ref 1.5–2.5)

## 2013-09-20 ENCOUNTER — Other Ambulatory Visit: Payer: Self-pay

## 2013-09-20 ENCOUNTER — Other Ambulatory Visit: Payer: Self-pay | Admitting: Physician Assistant

## 2013-09-20 ENCOUNTER — Telehealth: Payer: Self-pay

## 2013-09-20 DIAGNOSIS — G2581 Restless legs syndrome: Secondary | ICD-10-CM

## 2013-09-20 DIAGNOSIS — G249 Dystonia, unspecified: Secondary | ICD-10-CM

## 2013-09-20 DIAGNOSIS — M79609 Pain in unspecified limb: Secondary | ICD-10-CM

## 2013-09-20 NOTE — Telephone Encounter (Signed)
Spoke with patients husband and he is aware that Marchelle Folks feels that Ms Current would benefit from a a Neurology appointmentt, advised him that per Marchelle Folks stop the Benadryl at night and only take the Klonopin, Mr Menz verbalized understanding

## 2013-09-21 ENCOUNTER — Emergency Department (HOSPITAL_COMMUNITY): Payer: Medicare Other

## 2013-09-21 ENCOUNTER — Encounter (HOSPITAL_COMMUNITY): Payer: Self-pay | Admitting: Emergency Medicine

## 2013-09-21 ENCOUNTER — Inpatient Hospital Stay (HOSPITAL_COMMUNITY)
Admission: EM | Admit: 2013-09-21 | Discharge: 2013-09-24 | DRG: 640 | Disposition: A | Discharge status: Skilled Nursing Facility | Payer: Medicare Other | Attending: Internal Medicine | Admitting: Internal Medicine

## 2013-09-21 DIAGNOSIS — R29898 Other symptoms and signs involving the musculoskeletal system: Secondary | ICD-10-CM

## 2013-09-21 DIAGNOSIS — J449 Chronic obstructive pulmonary disease, unspecified: Secondary | ICD-10-CM | POA: Diagnosis present

## 2013-09-21 DIAGNOSIS — Z950 Presence of cardiac pacemaker: Secondary | ICD-10-CM

## 2013-09-21 DIAGNOSIS — E785 Hyperlipidemia, unspecified: Secondary | ICD-10-CM | POA: Diagnosis present

## 2013-09-21 DIAGNOSIS — Z79899 Other long term (current) drug therapy: Secondary | ICD-10-CM

## 2013-09-21 DIAGNOSIS — R5381 Other malaise: Secondary | ICD-10-CM

## 2013-09-21 DIAGNOSIS — J069 Acute upper respiratory infection, unspecified: Secondary | ICD-10-CM | POA: Diagnosis present

## 2013-09-21 DIAGNOSIS — D649 Anemia, unspecified: Secondary | ICD-10-CM | POA: Diagnosis present

## 2013-09-21 DIAGNOSIS — I10 Essential (primary) hypertension: Secondary | ICD-10-CM | POA: Diagnosis present

## 2013-09-21 DIAGNOSIS — R05 Cough: Secondary | ICD-10-CM

## 2013-09-21 DIAGNOSIS — T82190A Other mechanical complication of cardiac electrode, initial encounter: Secondary | ICD-10-CM

## 2013-09-21 DIAGNOSIS — M545 Low back pain, unspecified: Secondary | ICD-10-CM

## 2013-09-21 DIAGNOSIS — E039 Hypothyroidism, unspecified: Secondary | ICD-10-CM | POA: Diagnosis present

## 2013-09-21 DIAGNOSIS — M810 Age-related osteoporosis without current pathological fracture: Secondary | ICD-10-CM | POA: Diagnosis present

## 2013-09-21 DIAGNOSIS — Z87891 Personal history of nicotine dependence: Secondary | ICD-10-CM

## 2013-09-21 DIAGNOSIS — I5022 Chronic systolic (congestive) heart failure: Secondary | ICD-10-CM

## 2013-09-21 DIAGNOSIS — I2789 Other specified pulmonary heart diseases: Secondary | ICD-10-CM

## 2013-09-21 DIAGNOSIS — R531 Weakness: Secondary | ICD-10-CM

## 2013-09-21 DIAGNOSIS — J4489 Other specified chronic obstructive pulmonary disease: Secondary | ICD-10-CM | POA: Diagnosis present

## 2013-09-21 DIAGNOSIS — E1129 Type 2 diabetes mellitus with other diabetic kidney complication: Secondary | ICD-10-CM | POA: Diagnosis present

## 2013-09-21 DIAGNOSIS — R059 Cough, unspecified: Secondary | ICD-10-CM

## 2013-09-21 DIAGNOSIS — R0609 Other forms of dyspnea: Secondary | ICD-10-CM

## 2013-09-21 DIAGNOSIS — I428 Other cardiomyopathies: Secondary | ICD-10-CM

## 2013-09-21 DIAGNOSIS — IMO0002 Reserved for concepts with insufficient information to code with codable children: Secondary | ICD-10-CM

## 2013-09-21 DIAGNOSIS — I4891 Unspecified atrial fibrillation: Secondary | ICD-10-CM

## 2013-09-21 DIAGNOSIS — I509 Heart failure, unspecified: Secondary | ICD-10-CM | POA: Diagnosis present

## 2013-09-21 DIAGNOSIS — M48061 Spinal stenosis, lumbar region without neurogenic claudication: Secondary | ICD-10-CM | POA: Diagnosis present

## 2013-09-21 DIAGNOSIS — R109 Unspecified abdominal pain: Secondary | ICD-10-CM

## 2013-09-21 DIAGNOSIS — E860 Dehydration: Principal | ICD-10-CM

## 2013-09-21 DIAGNOSIS — R5383 Other fatigue: Secondary | ICD-10-CM

## 2013-09-21 DIAGNOSIS — E43 Unspecified severe protein-calorie malnutrition: Secondary | ICD-10-CM | POA: Diagnosis present

## 2013-09-21 DIAGNOSIS — M4800 Spinal stenosis, site unspecified: Secondary | ICD-10-CM

## 2013-09-21 DIAGNOSIS — Z66 Do not resuscitate: Secondary | ICD-10-CM | POA: Diagnosis present

## 2013-09-21 DIAGNOSIS — E119 Type 2 diabetes mellitus without complications: Secondary | ICD-10-CM | POA: Diagnosis present

## 2013-09-21 DIAGNOSIS — G4736 Sleep related hypoventilation in conditions classified elsewhere: Secondary | ICD-10-CM

## 2013-09-21 DIAGNOSIS — E876 Hypokalemia: Secondary | ICD-10-CM

## 2013-09-21 LAB — COMPREHENSIVE METABOLIC PANEL
ALBUMIN: 3.2 g/dL — AB (ref 3.5–5.2)
ALT: 12 U/L (ref 0–35)
AST: 15 U/L (ref 0–37)
Alkaline Phosphatase: 97 U/L (ref 39–117)
BUN: 12 mg/dL (ref 6–23)
CO2: 28 mEq/L (ref 19–32)
Calcium: 8.9 mg/dL (ref 8.4–10.5)
Chloride: 101 mEq/L (ref 96–112)
Creatinine, Ser: 1.17 mg/dL — ABNORMAL HIGH (ref 0.50–1.10)
GFR calc non Af Amer: 42 mL/min — ABNORMAL LOW (ref >90)
GFR, EST AFRICAN AMERICAN: 49 mL/min — AB (ref >90)
Glucose, Bld: 115 mg/dL — ABNORMAL HIGH (ref 70–99)
Potassium: 3.5 mEq/L — ABNORMAL LOW (ref 3.7–5.3)
Sodium: 143 mEq/L (ref 137–147)
TOTAL PROTEIN: 6.9 g/dL (ref 6.0–8.3)
Total Bilirubin: <0.2 mg/dL — ABNORMAL LOW (ref 0.3–1.2)

## 2013-09-21 LAB — CBC WITH DIFFERENTIAL/PLATELET
Basophils Absolute: 0 10*3/uL (ref 0.0–0.1)
Basophils Relative: 0 % (ref 0–1)
Eosinophils Absolute: 0.2 10*3/uL (ref 0.0–0.7)
Eosinophils Relative: 2 % (ref 0–5)
HCT: 36 % (ref 36.0–46.0)
Hemoglobin: 11.1 g/dL — ABNORMAL LOW (ref 12.0–15.0)
LYMPHS PCT: 19 % (ref 12–46)
Lymphs Abs: 1.6 10*3/uL (ref 0.7–4.0)
MCH: 25.5 pg — ABNORMAL LOW (ref 26.0–34.0)
MCHC: 30.8 g/dL (ref 30.0–36.0)
MCV: 82.6 fL (ref 78.0–100.0)
MONO ABS: 0.8 10*3/uL (ref 0.1–1.0)
Monocytes Relative: 9 % (ref 3–12)
NEUTROS ABS: 6.3 10*3/uL (ref 1.7–7.7)
Neutrophils Relative %: 71 % (ref 43–77)
PLATELETS: 309 10*3/uL (ref 150–400)
RBC: 4.36 MIL/uL (ref 3.87–5.11)
RDW: 15.7 % — ABNORMAL HIGH (ref 11.5–15.5)
WBC: 8.9 10*3/uL (ref 4.0–10.5)

## 2013-09-21 LAB — PROTIME-INR
INR: 1.09 (ref 0.00–1.49)
Prothrombin Time: 13.9 seconds (ref 11.6–15.2)

## 2013-09-21 LAB — URINALYSIS, ROUTINE W REFLEX MICROSCOPIC
BILIRUBIN URINE: NEGATIVE
GLUCOSE, UA: NEGATIVE mg/dL
Ketones, ur: NEGATIVE mg/dL
LEUKOCYTES UA: NEGATIVE
Nitrite: NEGATIVE
PH: 6.5 (ref 5.0–8.0)
PROTEIN: NEGATIVE mg/dL
Specific Gravity, Urine: 1.017 (ref 1.005–1.030)
Urobilinogen, UA: 0.2 mg/dL (ref 0.0–1.0)

## 2013-09-21 LAB — URINE MICROSCOPIC-ADD ON

## 2013-09-21 LAB — PHOSPHORUS: Phosphorus: 3 mg/dL (ref 2.3–4.6)

## 2013-09-21 LAB — MAGNESIUM: Magnesium: 2 mg/dL (ref 1.5–2.5)

## 2013-09-21 LAB — APTT: APTT: 42 s — AB (ref 24–37)

## 2013-09-21 LAB — GLUCOSE, CAPILLARY: GLUCOSE-CAPILLARY: 95 mg/dL (ref 70–99)

## 2013-09-21 MED ORDER — TIOTROPIUM BROMIDE MONOHYDRATE 18 MCG IN CAPS
18.0000 ug | ORAL_CAPSULE | Freq: Every day | RESPIRATORY_TRACT | Status: DC
  Administered 2013-09-22 – 2013-09-24 (×3): 18 ug via RESPIRATORY_TRACT
  Filled 2013-09-21: qty 5

## 2013-09-21 MED ORDER — IPRATROPIUM BROMIDE 0.02 % IN SOLN
0.5000 mg | Freq: Once | RESPIRATORY_TRACT | Status: AC
  Administered 2013-09-21: 0.5 mg via RESPIRATORY_TRACT
  Filled 2013-09-21: qty 2.5

## 2013-09-21 MED ORDER — LEVOTHYROXINE SODIUM 112 MCG PO TABS
112.0000 ug | ORAL_TABLET | Freq: Every day | ORAL | Status: DC
  Administered 2013-09-22 – 2013-09-24 (×3): 112 ug via ORAL
  Filled 2013-09-21 (×5): qty 1

## 2013-09-21 MED ORDER — CARVEDILOL 6.25 MG PO TABS
6.2500 mg | ORAL_TABLET | Freq: Two times a day (BID) | ORAL | Status: DC
  Administered 2013-09-22 – 2013-09-24 (×5): 6.25 mg via ORAL
  Filled 2013-09-21 (×8): qty 1

## 2013-09-21 MED ORDER — INSULIN ASPART 100 UNIT/ML ~~LOC~~ SOLN
0.0000 [IU] | Freq: Three times a day (TID) | SUBCUTANEOUS | Status: DC
  Administered 2013-09-22 – 2013-09-24 (×4): 1 [IU] via SUBCUTANEOUS

## 2013-09-21 MED ORDER — METOCLOPRAMIDE HCL 5 MG PO TABS
5.0000 mg | ORAL_TABLET | Freq: Every day | ORAL | Status: DC
  Administered 2013-09-21 – 2013-09-24 (×4): 5 mg via ORAL
  Filled 2013-09-21 (×4): qty 1

## 2013-09-21 MED ORDER — FENTANYL CITRATE 0.05 MG/ML IJ SOLN
50.0000 ug | Freq: Once | INTRAMUSCULAR | Status: AC
  Administered 2013-09-21: 50 ug via INTRAVENOUS
  Filled 2013-09-21: qty 2

## 2013-09-21 MED ORDER — ALBUTEROL SULFATE (2.5 MG/3ML) 0.083% IN NEBU
3.0000 mL | INHALATION_SOLUTION | Freq: Four times a day (QID) | RESPIRATORY_TRACT | Status: DC | PRN
  Administered 2013-09-23 (×2): 3 mL via RESPIRATORY_TRACT
  Filled 2013-09-21 (×2): qty 3

## 2013-09-21 MED ORDER — CLONAZEPAM 0.5 MG PO TABS: 0.2500 mg | ORAL_TABLET | Freq: Every evening | ORAL | Status: DC | PRN

## 2013-09-21 MED ORDER — HEPARIN SODIUM (PORCINE) 5000 UNIT/ML IJ SOLN
5000.0000 [IU] | Freq: Three times a day (TID) | INTRAMUSCULAR | Status: DC
  Administered 2013-09-22 – 2013-09-24 (×9): 5000 [IU] via SUBCUTANEOUS
  Filled 2013-09-21 (×11): qty 1

## 2013-09-21 MED ORDER — PANTOPRAZOLE SODIUM 40 MG PO TBEC
40.0000 mg | DELAYED_RELEASE_TABLET | Freq: Every day | ORAL | Status: DC
  Administered 2013-09-21 – 2013-09-24 (×4): 40 mg via ORAL
  Filled 2013-09-21 (×4): qty 1

## 2013-09-21 MED ORDER — ONDANSETRON HCL 4 MG/2ML IJ SOLN: 4.0000 mg | Freq: Three times a day (TID) | INTRAMUSCULAR | Status: AC | PRN

## 2013-09-21 MED ORDER — KCL IN DEXTROSE-NACL 20-5-0.45 MEQ/L-%-% IV SOLN
INTRAVENOUS | Status: DC
  Administered 2013-09-21 – 2013-09-23 (×2): via INTRAVENOUS
  Filled 2013-09-21 (×5): qty 1000

## 2013-09-21 MED ORDER — FENTANYL CITRATE 0.05 MG/ML IJ SOLN
50.0000 ug | INTRAMUSCULAR | Status: DC | PRN
  Administered 2013-09-21 – 2013-09-23 (×7): 50 ug via INTRAVENOUS
  Filled 2013-09-21 (×7): qty 2

## 2013-09-21 MED ORDER — ALBUTEROL SULFATE (2.5 MG/3ML) 0.083% IN NEBU
5.0000 mg | INHALATION_SOLUTION | Freq: Once | RESPIRATORY_TRACT | Status: AC
  Administered 2013-09-21: 5 mg via RESPIRATORY_TRACT
  Filled 2013-09-21: qty 6

## 2013-09-21 MED ORDER — AMLODIPINE BESYLATE 5 MG PO TABS
5.0000 mg | ORAL_TABLET | Freq: Every day | ORAL | Status: DC
  Administered 2013-09-22 – 2013-09-24 (×4): 5 mg via ORAL
  Filled 2013-09-21 (×4): qty 1

## 2013-09-21 MED ORDER — ADULT MULTIVITAMIN W/MINERALS CH
1.0000 | ORAL_TABLET | Freq: Every day | ORAL | Status: DC
  Administered 2013-09-22 – 2013-09-24 (×3): 1 via ORAL
  Filled 2013-09-21 (×3): qty 1

## 2013-09-21 NOTE — ED Notes (Addendum)
Pt presents from home via [facility] with c/o right hip and low back pain x3 days. Per EMS pt has hx right hip fracture "A few months ago" and is currently in extreme pain and unable to ambulate or sit up in bed. No recent hx of falls, no shortening/deformity. Pt given Fentanyl PTA. Pt is alert oriented x4.

## 2013-09-21 NOTE — ED Notes (Signed)
When asked to point to exactly where the pain is, pt points to bilateral lower abdominal quadrants. Pt also reports she has had dysuria x3 days. PT able to move legs without issue at this time.

## 2013-09-21 NOTE — Progress Notes (Signed)
Pt admitted to the unit. Pt is alert and oriented. Pt oriented to room, staff, and call bell. Educated on fall safety plan. Bed in lowest position. Full assessment to Epic. Call bell with in reach. Educated to call for assists. Will continue to monitor. Laketta Soderberg, RN 

## 2013-09-21 NOTE — H&P (Addendum)
[facility] History and Physical  [name] ZOX:[id number] DOB: 25-Mar-1930 DOA: 09/21/2013  Referring physician: Dr. Radford Pax PCP: Nadean Corwin, MD   Chief Complaint: Generalized weakness  HPI: [name] is a 80 y.o. female  With a history of hypothyroidism, hypertension, spinal stenosis, COPD, and diabetes mellitus. The patient states that within the last couple of weeks she has been feeling more weak. States that within last 3 days the weakness has gotten noticeably worse. She states that at this point she has been having trouble ambulating. Her husband reports that, per my discussion with the ED physian, that he is unable to take care of her at home. The patient denies any recent fevers or chills. Given the persistence in symptoms and worsening of symptoms patient decided to come to the ED for further evaluation. Since onset the problem has been persistent and gradually getting worse. Patient also endorses poor oral intake    While in the ED patient had CT of spine which reported multilevel areas of mild to moderate multifactorial canal stenosis. Patient was afebrile. Given persistence of symptoms and difficulty ambulating we were consult at for further evaluation and recommendations.   Review of Systems:  Constitutional:  No weight loss, night sweats, Fevers, chills, fatigue.  HEENT:  No headaches, Difficulty swallowing,Tooth/dental problems,Sore throat,  No sneezing, itching, ear ache, nasal congestion, post nasal drip,  Cardio-vascular:  No chest pain, Orthopnea, PND, swelling in lower extremities, anasarca, dizziness, palpitations  GI:  No heartburn, indigestion, abdominal pain, nausea, vomiting, diarrhea, change in bowel habits, + loss of appetite  Resp:  No shortness of breath with exertion or at rest. No excess mucus, no productive cough, No non-productive cough, No coughing up of blood.No change in color of mucus.No wheezing.No chest wall deformity   Skin:  no rash or lesions.  GU:  no dysuria, change in color of urine, no urgency or frequency. No flank pain.  Musculoskeletal:  + joint pain or no swelling. No decreased range of motion. + back pain.  Psych:  No change in mood or affect. No depression or anxiety. No memory loss.   Past Medical History  Diagnosis Date  . CONGESTIVE HEART FAILURE 06/03/2007  . COPD 06/03/2007  . HYPERTENSION 06/03/2007  . HYPOTHYROIDISM 06/03/2007  . Hernia   . Asthma   . Diabetes mellitus   . Hyperlipidemia   . Arthritis   . Osteoporosis   . Cardiomyopathy, nonischemic     Irritable ejection fractions most recently EF 60% 3/14>> EF 30% 10/14  . Biventricular pacemaker -Medtronic]     Downgrade from CRT-D; LV and RV rate sense portions switched with subsequent fracture of morning rate sense portion in the LV port  . 6949-lead     Rate sense no LV port with subsequent fracture; high-voltage inactivated following down rate   Past Surgical History  Procedure Laterality Date  . Cardiac defibrillator placement    . Appendectomy    . Hysterectomy - unknown type    . Posterior fusion clivus-c2 transarticular stealth guided w/ icbg    . Cystectomy      elbow  . Icd      medtronic maximo  . Polypectomy      bladder  . Esophagogastroduodenoscopy N/A 10/30/2012    Procedure: ESOPHAGOGASTRODUODENOSCOPY (EGD);  Surgeon: Vertell Novak., MD;  Location: [facility];  Service: Endoscopy;  Laterality: N/A;   Social History:  reports that she quit smoking about 17 years ago. She has never used smokeless  tobacco. She reports that she does not drink alcohol or use illicit drugs.  Allergies  Allergen Reactions  . Ace Inhibitors   . Acetaminophen-Codeine     REACTION: n/v  . Atorvastatin   . Bisoprolol Fumarate   . Ezetimibe-Simvastatin   . Fluvastatin Sodium   . Gabapentin   . Gemfibrozil   . Lisinopril     REACTION: unspecified  . Oxycodone Hcl     REACTION: vomiting  . Rosuvastatin      Family History  Problem Relation Age of Onset  . Heart disease Mother   . Breast cancer Mother   . Brain cancer Mother      Prior to Admission medications   Medication Sig Start Date End Date Taking? Authorizing Provider  amLODipine (NORVASC) 5 MG tablet Take 1 tablet (5 mg total) by mouth daily. 08/30/13   Duke Salvia, MD  carvedilol (COREG) 6.25 MG tablet Take 1 tablet (6.25 mg total) by mouth 2 (two) times daily. 08/30/13   Duke Salvia, MD  clonazePAM Scarlette Calico) 0.5 MG tablet 1/2-1 at night for sleep/leg pain 09/15/13   Quentin Mulling, PA-C  diphenhydrAMINE (BENADRYL) 25 MG tablet Take 50 mg by mouth at bedtime as needed for sleep.     Historical Provider, MD  glyBURIDE (DIABETA) 5 MG tablet Take 5 mg by mouth. If glucose > 200. 02/18/11   Historical Provider, MD  levalbuterol (XOPENEX HFA) 45 MCG/ACT inhaler Inhale 1-2 puffs into the lungs every 6 (six) hours as needed for wheezing. 09/17/11 08/30/13  Duke Salvia, MD  levothyroxine (SYNTHROID, LEVOTHROID) 112 MCG tablet Take 112 mcg by mouth daily.    Historical Provider, MD  metoCLOPramide (REGLAN) 5 MG tablet Take 5 mg by mouth daily.    Historical Provider, MD  omeprazole (PRILOSEC) 20 MG capsule Take 20 mg by mouth 2 (two) times daily.    Historical Provider, MD  SPIRIVA HANDIHALER 18 MCG inhalation capsule inhale the contents of one capsule in the handihaler once daily 12/15/12   Leslye Peer, MD   Physical Exam: Filed Vitals:   09/21/13 2033  BP:   Pulse: 70  Temp:   Resp: 17    BP 154/61  Pulse 70  Temp(Src) 98.7 F (37.1 C) (Oral)  Resp 17  SpO2 98%  General:  Appears calm and comfortable, thin, alert and oriented x3 Eyes: PERRL, normal lids, irises & conjunctiva ENT:  normal exterior appearance, dry mucous membranes Neck: no LAD, masses or thyromegaly Cardiovascular: RRR, no m/r/g. No LE edema. Respiratory: CTA bilaterally, no w/r/r. Normal respiratory effort. Abdomen: soft, nt, nd Skin: no rash or  induration seen on limited exam Musculoskeletal:  no cyanosis or clubbing  Psychiatric: grossly normal mood and affect, speech fluent and appropriate Neurologic:  no facial asymmetry, lower extremities strength equal, sensation to light touch intact, answers questions appropriately           Labs on Admission:  Basic Metabolic Panel:  Recent Labs Lab 09/15/13 1359 09/21/13 1514  NA 136 143  K 3.9 3.5*  CL 101 101  CO2 25 28  GLUCOSE 177* 115*  BUN 14 12  CREATININE 1.39* 1.17*  CALCIUM 8.8 8.9  MG 1.8  --    Liver Function Tests:  Recent Labs Lab 09/15/13 1359 09/21/13 1514  AST 16 15  ALT 11 12  ALKPHOS 94 97  BILITOT 0.3 <0.2*  PROT 6.6 6.9  ALBUMIN 3.7 3.2*   No results found for this basename: LIPASE, AMYLASE,  in the last 168 hours No results found for this basename: AMMONIA,  in the last 168 hours CBC:  Recent Labs Lab 09/15/13 1359 09/21/13 1514  WBC 9.2 8.9  NEUTROABS 7.0 6.3  HGB 10.6* 11.1*  HCT 33.8* 36.0  MCV 78.6 82.6  PLT 397 309   Cardiac Enzymes: No results found for this basename: CKTOTAL, CKMB, CKMBINDEX, TROPONINI,  in the last 168 hours  BNP (last 3 results)  Recent Labs  11/02/12 1607 05/24/13 1323  PROBNP 6510.0* 90.0   CBG: No results found for this basename: GLUCAP,  in the last 168 hours  Radiological Exams on Admission: Dg Chest 2 View  09/21/2013   CLINICAL DATA:  Short of breath  EXAM: CHEST  2 VIEW  COMPARISON:  06/03/2013  FINDINGS: COPD with hyperinflation. AICD is unchanged from the prior study. Of the lungs are clear without infiltrate effusion or heart failure.  IMPRESSION: COPD without acute abnormality.   Electronically Signed   By: Marlan Palau M.D.   On: 09/21/2013 17:09   Ct Lumbar Spine Wo Contrast  09/21/2013   CLINICAL DATA:  Back pain  EXAM: CT LUMBAR SPINE WITHOUT CONTRAST  TECHNIQUE: Multidetector CT imaging of the lumbar spine was performed without intravenous contrast administration. Multiplanar CT  image reconstructions were also generated.  COMPARISON:  None.  FINDINGS: Diffuse heterogeneous attenuation is appreciated throughout the lumbar vertebra. There is no evidence vertebral body collapse. Areas of central endplate depression identified inferiorly L1, inferiorly L2, superior and inferior portions of L3, inferior aspect of L for as well as see and superior aspect of L5. Regions of sclerosis are appreciated adjacent to the areas of central depression. These findings have the appearance of Schmorl's nodes. No focal sclerotic. Lesion is appreciated. There are areas of mild disc space narrowing throughout the lumbar spine as well as areas of anterior endplate hypertrophic spurring.  At the L1-2 level a broad-based disc bulge is appreciated causing partial effacement of the anterior CSF space and resulting in mild canal narrowing. At the L2-3 level a broad-based disc bulge is appreciated causing effacement of the CSF space also resulting in mild canal narrowing. At the L3-4 level a broad-based disc bulge is appreciated as well as endplate hypertrophic spurring. These findings contribute to mild canal stenosis. At the L4-5 level a broad-based disc bulge is appreciated as well as ligamentum flavum hypertrophy. Mild canal narrowing is identified at this level. At the L5-S1 level a broad-based disc bulge is appreciated as well as ligamentum flavum hypertrophy contributing to mild canal stenosis.  Facet hypertrophy is appreciated at the L2-3, L3-4, L4-5 and L5-S1 levels most prominent within the L3-4 and L5-S1 levels. These findings contribute to canal narrowing. There is not appear to be significant neural foraminal narrowing secondary to osseous etiologies. Areas of subarticular sclerosis with subchondral cyst formation is appreciated involving the sacroiliac joints primarily the iliac component. The joint space appears to be maintained.  There is no evidence of abnormalities within the surrounding paraspinous  musculature. Diffuse atherosclerotic calcifications are appreciated within the aorta.  IMPRESSION: Multilevel spondylolysis without evidence of acute osseous abnormalities. There are diffuse areas of sclerosis within the vertebral bodies which appear to be primarily focused along regions of a Schmorl's nodes. An underlying infiltrating the etiology cannot be excluded. If clinically appropriate.  There on the multilevel areas of mild to moderate multifactorial canal stenosis as described above.  Bilateral osteoarthritic changes appreciated involving the sacroiliac joints.   Electronically Signed   By:  Salome HolmesHector  Cooper M.D.   On: 09/21/2013 17:10    Assessment/Plan Principal problem:  Generalized weakness - At this point suspecting malnutrition may be playing a role, will obtain dietitian consult, multivitamin, and liberalize diet. - Will obtain physical therapy consult - TSH, magnesium, phosphorus  Active Problems:   Spinal stenosis - Continue pain control - CT of spine obtained in the ED showing multilevel areas of mild to moderate multifactorial canal stenosis  Hypothyroidism - Will obtain TSH levels and continue home regimen Synthroid  Diabetes mellitus type 2 - Sliding scale insulin - Hold oral hypoglycemic agents  Hypertension - Stable continue home regimen  COPD -Stable  DVT prophylaxis -Heparin subcutaneous  Code Status: Addendum: DNR Family Communication: No family at bedside Disposition Plan: Pending physical therapy recommendations.  Time spent: > 65 minutes  Penny PiaVEGA, Jestina Stephani [facility] Pager [phone]

## 2013-09-21 NOTE — ED Notes (Signed)
Attempted to give reportx1 

## 2013-09-21 NOTE — ED Provider Notes (Signed)
CSN: [id number]     Arrival date & time 09/21/13  1440 History   First MD Initiated Contact with Patient 09/21/13 1452     Chief Complaint  Patient presents with  . Dysuria  . Abdominal Pain    HPI Pt presents from home via [facility] with c/o right hip and low back pain x3 days. Per EMS pt has hx right hip fracture "A few months ago" and is currently in extreme pain and unable to ambulate or sit up in bed. No recent hx of falls, no shortening/deformity. Pt given Fentanyl PTA. Pt is alert oriented x4.  According to the patient and her husband she has not been able to walk for the last 3-4 days.  Her husband is been trying to help her but now is unable to.  Past Medical History  Diagnosis Date  . CONGESTIVE HEART FAILURE 06/03/2007  . COPD 06/03/2007  . HYPERTENSION 06/03/2007  . HYPOTHYROIDISM 06/03/2007  . Hernia   . Asthma   . Diabetes mellitus   . Hyperlipidemia   . Arthritis   . Osteoporosis   . Cardiomyopathy, nonischemic     Irritable ejection fractions most recently EF 60% 3/14>> EF 30% 10/14  . Biventricular pacemaker -Medtronic]     Downgrade from CRT-D; LV and RV rate sense portions switched with subsequent fracture of morning rate sense portion in the LV port  . 6949-lead     Rate sense no LV port with subsequent fracture; high-voltage inactivated following down rate   Past Surgical History  Procedure Laterality Date  . Cardiac defibrillator placement    . Appendectomy    . Hysterectomy - unknown type    . Posterior fusion clivus-c2 transarticular stealth guided w/ icbg    . Cystectomy      elbow  . Icd      medtronic maximo  . Polypectomy      bladder  . Esophagogastroduodenoscopy N/A 10/30/2012    Procedure: ESOPHAGOGASTRODUODENOSCOPY (EGD);  Surgeon: Vertell Novak., MD;  Location: [facility];  Service: Endoscopy;  Laterality: N/A;   Family History  Problem Relation Age of Onset  . Heart disease Mother   . Breast cancer Mother   . Brain cancer  Mother    History  Substance Use Topics  . Smoking status: Former Smoker -- 2.00 packs/day for 30 years    Quit date: 08/19/1996  . Smokeless tobacco: Never Used  . Alcohol Use: No   OB History   Grav Para Term Preterm Abortions TAB SAB Ect Mult Living                 Review of Systems All other systems reviewed and are negative Allergies  Ace inhibitors; Acetaminophen-codeine; Atorvastatin; Bisoprolol fumarate; Ezetimibe-simvastatin; Fluvastatin sodium; Gabapentin; Gemfibrozil; Lisinopril; Oxycodone hcl; and Rosuvastatin  Home Medications   Current Outpatient Rx  Name  Route  Sig  Dispense  Refill  . amLODipine (NORVASC) 5 MG tablet   Oral   Take 1 tablet (5 mg total) by mouth daily.   30 tablet   11     Dose change   . carvedilol (COREG) 6.25 MG tablet   Oral   Take 1 tablet (6.25 mg total) by mouth 2 (two) times daily.   60 tablet   6   . clonazePAM (KLONOPIN) 0.5 MG tablet      1/2-1 at night for sleep/leg pain   30 tablet   0   . diphenhydrAMINE (BENADRYL) 25 MG tablet  Oral   Take 50 mg by mouth at bedtime as needed for sleep.          [name] glyBURIDE (DIABETA) 5 MG tablet   Oral   Take 5 mg by mouth. If glucose > 200.         [name] EXPIRED: levalbuterol (XOPENEX HFA) 45 MCG/ACT inhaler   Inhalation   Inhale 1-2 puffs into the lungs every 6 (six) hours as needed for wheezing.   1 Inhaler   3   . levothyroxine (SYNTHROID, LEVOTHROID) 112 MCG tablet   Oral   Take 112 mcg by mouth daily.         . metoCLOPramide (REGLAN) 5 MG tablet   Oral   Take 5 mg by mouth daily.         [name] omeprazole (PRILOSEC) 20 MG capsule   Oral   Take 20 mg by mouth 2 (two) times daily.         [name] SPIRIVA HANDIHALER 18 MCG inhalation capsule      inhale the contents of one capsule in the handihaler once daily   30 capsule   6    BP 147/68  Pulse 72  Temp(Src) 98.7 F (37.1 C) (Oral)  Resp 18  SpO2 94% Physical Exam  Nursing note and vitals  reviewed. Constitutional: She is oriented to person, place, and time. She appears well-developed and well-nourished. No distress.  HENT:  Head: Normocephalic and atraumatic.  Eyes: Pupils are equal, round, and reactive to light.  Neck: Normal range of motion.  Cardiovascular: Normal rate and intact distal pulses.   Pulmonary/Chest: No respiratory distress.  Abdominal: Normal appearance. She exhibits no distension.  Musculoskeletal:       Lumbar back: She exhibits decreased range of motion, bony tenderness, pain and spasm.  Neurological: She is alert and oriented to person, place, and time. No cranial nerve deficit.  Reflex Scores:      Bicep reflexes are 3+ on the right side.      Patellar reflexes are 1+ on the right side and 1+ on the left side.      Achilles reflexes are 1+ on the right side and 1+ on the left side. Weakness in both lower legs to flexion at hip and extension at hip  Skin: Skin is warm and dry. No rash noted.  Psychiatric: She has a normal mood and affect. Her behavior is normal.    ED Course  Procedures (including critical care time) Labs Review Labs Reviewed  URINALYSIS, ROUTINE W REFLEX MICROSCOPIC - Abnormal; Notable for the following:    Hgb urine dipstick SMALL (*)    All other components within normal limits  COMPREHENSIVE METABOLIC PANEL - Abnormal; Notable for the following:    Potassium 3.5 (*)    Glucose, Bld 115 (*)    Creatinine, Ser 1.17 (*)    Albumin 3.2 (*)    Total Bilirubin <0.2 (*)    GFR calc non Af Amer 42 (*)    GFR calc Af Amer 49 (*)    All other components within normal limits  CBC WITH DIFFERENTIAL - Abnormal; Notable for the following:    Hemoglobin 11.1 (*)    MCH 25.5 (*)    RDW 15.7 (*)    All other components within normal limits  URINE MICROSCOPIC-ADD ON - Abnormal; Notable for the following:    Squamous Epithelial / LPF FEW (*)    All other components within normal limits  APTT - Abnormal; Notable for  the following:     aPTT 42 (*)    All other components within normal limits  URINE CULTURE  PROTIME-INR   Imaging Review Dg Chest 2 View  09/21/2013   CLINICAL DATA:  Short of breath  EXAM: CHEST  2 VIEW  COMPARISON:  06/03/2013  FINDINGS: COPD with hyperinflation. AICD is unchanged from the prior study. Of the lungs are clear without infiltrate effusion or heart failure.  IMPRESSION: COPD without acute abnormality.   Electronically Signed   By: Marlan Palauharles  Clark M.D.   On: 09/21/2013 17:09   Ct Lumbar Spine Wo Contrast  09/21/2013   CLINICAL DATA:  Back pain  EXAM: CT LUMBAR SPINE WITHOUT CONTRAST  TECHNIQUE: Multidetector CT imaging of the lumbar spine was performed without intravenous contrast administration. Multiplanar CT image reconstructions were also generated.  COMPARISON:  None.  FINDINGS: Diffuse heterogeneous attenuation is appreciated throughout the lumbar vertebra. There is no evidence vertebral body collapse. Areas of central endplate depression identified inferiorly L1, inferiorly L2, superior and inferior portions of L3, inferior aspect of L for as well as see and superior aspect of L5. Regions of sclerosis are appreciated adjacent to the areas of central depression. These findings have the appearance of Schmorl's nodes. No focal sclerotic. Lesion is appreciated. There are areas of mild disc space narrowing throughout the lumbar spine as well as areas of anterior endplate hypertrophic spurring.  At the L1-2 level a broad-based disc bulge is appreciated causing partial effacement of the anterior CSF space and resulting in mild canal narrowing. At the L2-3 level a broad-based disc bulge is appreciated causing effacement of the CSF space also resulting in mild canal narrowing. At the L3-4 level a broad-based disc bulge is appreciated as well as endplate hypertrophic spurring. These findings contribute to mild canal stenosis. At the L4-5 level a broad-based disc bulge is appreciated as well as ligamentum flavum  hypertrophy. Mild canal narrowing is identified at this level. At the L5-S1 level a broad-based disc bulge is appreciated as well as ligamentum flavum hypertrophy contributing to mild canal stenosis.  Facet hypertrophy is appreciated at the L2-3, L3-4, L4-5 and L5-S1 levels most prominent within the L3-4 and L5-S1 levels. These findings contribute to canal narrowing. There is not appear to be significant neural foraminal narrowing secondary to osseous etiologies. Areas of subarticular sclerosis with subchondral cyst formation is appreciated involving the sacroiliac joints primarily the iliac component. The joint space appears to be maintained.  There is no evidence of abnormalities within the surrounding paraspinous musculature. Diffuse atherosclerotic calcifications are appreciated within the aorta.    IMPRESSION: Multilevel spondylolysis without evidence of acute osseous abnormalities. There are diffuse areas of sclerosis within the vertebral bodies which appear to be primarily focused along regions of a Schmorl's nodes. An underlying infiltrating the etiology cannot be excluded. If clinically appropriate.  There on the multilevel areas of mild to moderate multifactorial canal stenosis as described above.  Bilateral osteoarthritic changes appreciated involving the sacroiliac joints.   Electronically Signed   By: Salome HolmesHector  Cooper M.D.   On: 09/21/2013 17:10      MDM   1. Spinal stenosis   2. Severe low back pain   3. Leg weakness, bilateral        Nelia Shiobert L Abigial [place]ville, MD 09/21/13 [phone]

## 2013-09-21 NOTE — ED Notes (Signed)
Pt began to desat to 84 with good pleth. Placed patient on 3 L of O2 [place]

## 2013-09-22 DIAGNOSIS — E860 Dehydration: Principal | ICD-10-CM

## 2013-09-22 DIAGNOSIS — R29898 Other symptoms and signs involving the musculoskeletal system: Secondary | ICD-10-CM

## 2013-09-22 DIAGNOSIS — E876 Hypokalemia: Secondary | ICD-10-CM

## 2013-09-22 LAB — CBC
HEMATOCRIT: 31.3 % — AB (ref 36.0–46.0)
Hemoglobin: 9.7 g/dL — ABNORMAL LOW (ref 12.0–15.0)
MCH: 25.7 pg — AB (ref 26.0–34.0)
MCHC: 31 g/dL (ref 30.0–36.0)
MCV: 82.8 fL (ref 78.0–100.0)
Platelets: 275 10*3/uL (ref 150–400)
RBC: 3.78 MIL/uL — AB (ref 3.87–5.11)
RDW: 16 % — ABNORMAL HIGH (ref 11.5–15.5)
WBC: 6.7 10*3/uL (ref 4.0–10.5)

## 2013-09-22 LAB — GLUCOSE, CAPILLARY
GLUCOSE-CAPILLARY: 156 mg/dL — AB (ref 70–99)
Glucose-Capillary: 110 mg/dL — ABNORMAL HIGH (ref 70–99)
Glucose-Capillary: 124 mg/dL — ABNORMAL HIGH (ref 70–99)
Glucose-Capillary: 123 mg/dL — ABNORMAL HIGH (ref 70–99)

## 2013-09-22 LAB — BASIC METABOLIC PANEL
BUN: 10 mg/dL (ref 6–23)
CHLORIDE: 101 meq/L (ref 96–112)
CO2: 27 mEq/L (ref 19–32)
CREATININE: 1.09 mg/dL (ref 0.50–1.10)
Calcium: 8.9 mg/dL (ref 8.4–10.5)
GFR calc Af Amer: 53 mL/min — ABNORMAL LOW (ref >90)
GFR calc non Af Amer: 46 mL/min — ABNORMAL LOW (ref >90)
GLUCOSE: 112 mg/dL — AB (ref 70–99)
Potassium: 3.5 mEq/L — ABNORMAL LOW (ref 3.7–5.3)
Sodium: 141 mEq/L (ref 137–147)

## 2013-09-22 LAB — TSH: TSH: 3.505 u[IU]/mL (ref 0.350–4.500)

## 2013-09-22 MED ORDER — ONDANSETRON HCL 4 MG/2ML IJ SOLN
4.0000 mg | Freq: Four times a day (QID) | INTRAMUSCULAR | Status: DC
  Administered 2013-09-22: 4 mg via INTRAVENOUS
  Filled 2013-09-22 (×2): qty 2

## 2013-09-22 MED ORDER — ONDANSETRON HCL 4 MG/2ML IJ SOLN
4.0000 mg | Freq: Four times a day (QID) | INTRAMUSCULAR | Status: DC
  Administered 2013-09-22: 4 mg via INTRAVENOUS

## 2013-09-22 MED ORDER — AZITHROMYCIN 250 MG PO TABS
250.0000 mg | ORAL_TABLET | Freq: Every day | ORAL | Status: DC
  Administered 2013-09-23 – 2013-09-24 (×2): 250 mg via ORAL
  Filled 2013-09-22 (×4): qty 1

## 2013-09-22 MED ORDER — ONDANSETRON HCL 4 MG/2ML IJ SOLN
4.0000 mg | Freq: Four times a day (QID) | INTRAMUSCULAR | Status: DC | PRN
  Filled 2013-09-22: qty 2

## 2013-09-22 MED ORDER — AZITHROMYCIN 500 MG PO TABS
500.0000 mg | ORAL_TABLET | Freq: Every day | ORAL | Status: AC
  Administered 2013-09-22: 500 mg via ORAL
  Filled 2013-09-22: qty 1

## 2013-09-22 MED ORDER — POTASSIUM CHLORIDE 20 MEQ/15ML (10%) PO LIQD
40.0000 meq | Freq: Once | ORAL | Status: AC
  Administered 2013-09-22: 40 meq via ORAL
  Filled 2013-09-22 (×2): qty 30

## 2013-09-22 NOTE — Progress Notes (Signed)
[facility] PROGRESS NOTE  [name] PFY:[id number] DOB: [birth year] DOA: 09/21/2013 PCP: Nadean Corwin, MD  Assessment/Plan: Generalized weakness  - ?URI- has a productive cough- will give azithromycin but no PNA on x ray- requiring O2 currently - Will obtain physical therapy consult  - TSH -magnesium, phosphorus ok -suspect dehydration with some renal insuff  Spinal stenosis  - Continue pain control  - CT of spine obtained in the ED showing multilevel areas of mild to moderate multifactorial canal stenosis  -PT consult  Hypothyroidism  - Will obtain TSH levels and continue home regimen Synthroid   Diabetes mellitus type 2  - Sliding scale insulin  - Hold oral hypoglycemic agents   Hypertension  - Stable continue home regimen   COPD  -Stable  Anemia -baseline about 9.8 -suspect she came in with dehydration  Code Status: full Family Communication: husband at bedside Disposition Plan: inpt   Consultants:  none  Procedures:  none  Antibiotics:  azithro 2/4  HPI/Subjective: Patient is feeling better than admission once they placed O2 on her Back pain has lessened  Objective: Filed Vitals:   09/22/13 0606  BP: 148/64  Pulse: 74  Temp: 98.4 F (36.9 C)  Resp: 16    Intake/Output Summary (Last 24 hours) at 09/22/13 1014 Last data filed at 09/22/13 0000  Gross per 24 hour  Intake      0 ml  Output    100 ml  Net   -100 ml   Filed Weights   09/21/13 2112  Weight: 53.5 kg (117 lb 15.1 oz)    Exam:   General:  A+Ox3, thin female- chronically ill  Cardiovascular: rrr  Respiratory: clear  Abdomen: +BS, soft  Musculoskeletal: moves all 4 ext   Data Reviewed: Basic Metabolic Panel:  Recent Labs Lab 09/15/13 1359 09/21/13 1514 09/21/13 2203 09/22/13 0548  NA 136 143  --  141  K 3.9 3.5*  --  3.5*  CL 101 101  --  101  CO2 25 28  --  27  GLUCOSE 177* 115*  --  112*  BUN 14 12  --  10  CREATININE 1.39* 1.17*   --  1.09  CALCIUM 8.8 8.9  --  8.9  MG 1.8  --  2.0  --   PHOS  --   --  3.0  --    Liver Function Tests:  Recent Labs Lab 09/15/13 1359 09/21/13 1514  AST 16 15  ALT 11 12  ALKPHOS 94 97  BILITOT 0.3 <0.2*  PROT 6.6 6.9  ALBUMIN 3.7 3.2*   No results found for this basename: LIPASE, AMYLASE,  in the last 168 hours No results found for this basename: AMMONIA,  in the last 168 hours CBC:  Recent Labs Lab 09/15/13 1359 09/21/13 1514 09/22/13 0548  WBC 9.2 8.9 6.7  NEUTROABS 7.0 6.3  --   HGB 10.6* 11.1* 9.7*  HCT 33.8* 36.0 31.3*  MCV 78.6 82.6 82.8  PLT 397 309 275   Cardiac Enzymes: No results found for this basename: CKTOTAL, CKMB, CKMBINDEX, TROPONINI,  in the last 168 hours BNP (last 3 results)  Recent Labs  11/02/12 1607 05/24/13 1323  PROBNP 6510.0* 90.0   CBG:  Recent Labs Lab 09/21/13 2114 09/22/13 0741  GLUCAP 95 110*    No results found for this or any previous visit (from the past 240 hour(s)).   Studies: Dg Chest 2 View  09/21/2013   CLINICAL DATA:  Short of breath  EXAM: CHEST  2 VIEW  COMPARISON:  06/03/2013  FINDINGS: COPD with hyperinflation. AICD is unchanged from the prior study. Of the lungs are clear without infiltrate effusion or heart failure.  IMPRESSION: COPD without acute abnormality.   Electronically Signed   By: Marlan Palauharles  Clark M.D.   On: 09/21/2013 17:09   Ct Lumbar Spine Wo Contrast  09/21/2013   CLINICAL DATA:  Back pain  EXAM: CT LUMBAR SPINE WITHOUT CONTRAST  TECHNIQUE: Multidetector CT imaging of the lumbar spine was performed without intravenous contrast administration. Multiplanar CT image reconstructions were also generated.  COMPARISON:  None.  FINDINGS: Diffuse heterogeneous attenuation is appreciated throughout the lumbar vertebra. There is no evidence vertebral body collapse. Areas of central endplate depression identified inferiorly L1, inferiorly L2, superior and inferior portions of L3, inferior aspect of L for as  well as see and superior aspect of L5. Regions of sclerosis are appreciated adjacent to the areas of central depression. These findings have the appearance of Schmorl's nodes. No focal sclerotic. Lesion is appreciated. There are areas of mild disc space narrowing throughout the lumbar spine as well as areas of anterior endplate hypertrophic spurring.  At the L1-2 level a broad-based disc bulge is appreciated causing partial effacement of the anterior CSF space and resulting in mild canal narrowing. At the L2-3 level a broad-based disc bulge is appreciated causing effacement of the CSF space also resulting in mild canal narrowing. At the L3-4 level a broad-based disc bulge is appreciated as well as endplate hypertrophic spurring. These findings contribute to mild canal stenosis. At the L4-5 level a broad-based disc bulge is appreciated as well as ligamentum flavum hypertrophy. Mild canal narrowing is identified at this level. At the L5-S1 level a broad-based disc bulge is appreciated as well as ligamentum flavum hypertrophy contributing to mild canal stenosis.  Facet hypertrophy is appreciated at the L2-3, L3-4, L4-5 and L5-S1 levels most prominent within the L3-4 and L5-S1 levels. These findings contribute to canal narrowing. There is not appear to be significant neural foraminal narrowing secondary to osseous etiologies. Areas of subarticular sclerosis with subchondral cyst formation is appreciated involving the sacroiliac joints primarily the iliac component. The joint space appears to be maintained.  There is no evidence of abnormalities within the surrounding paraspinous musculature. Diffuse atherosclerotic calcifications are appreciated within the aorta.  IMPRESSION: Multilevel spondylolysis without evidence of acute osseous abnormalities. There are diffuse areas of sclerosis within the vertebral bodies which appear to be primarily focused along regions of a Schmorl's nodes. An underlying infiltrating the  etiology cannot be excluded. If clinically appropriate.  There on the multilevel areas of mild to moderate multifactorial canal stenosis as described above.  Bilateral osteoarthritic changes appreciated involving the sacroiliac joints.   Electronically Signed   By: Salome HolmesHector  Cooper M.D.   On: 09/21/2013 17:10    Scheduled Meds: . amLODipine  5 mg Oral Daily  . azithromycin  500 mg Oral Daily   Followed by  . [START ON 09/23/2013] azithromycin  250 mg Oral Daily  . carvedilol  6.25 mg Oral BID AC  . heparin  5,000 Units Subcutaneous Q8H  . insulin aspart  0-9 Units Subcutaneous TID WC  . levothyroxine  112 mcg Oral QAC breakfast  . metoCLOPramide  5 mg Oral Daily  . multivitamin with minerals  1 tablet Oral Daily  . pantoprazole  40 mg Oral Daily  . potassium chloride  40 mEq Oral Once  . tiotropium  18 mcg Inhalation Daily  Continuous Infusions: . dextrose 5 % and 0.45 % NaCl with KCl 20 mEq/L 90 mL/hr at 09/21/13 2314    Principal Problem:   Weakness generalized Active Problems:   HYPOTHYROIDISM   HYPERTENSION   COPD   Type II or unspecified type diabetes mellitus without mention of complication, not stated as uncontrolled   Spinal stenosis    Time spent: 35 min    Lounette Sloan  [facility] Pager [phone]. If 7PM-7AM, please contact night-coverage at [email or web address], password [facility] 09/22/2013, 10:14 AM  LOS: 1 day

## 2013-09-22 NOTE — Evaluation (Signed)
Physical Therapy Evaluation Patient Details Name: [name] MRN: [id number] DOB: 01-Sep-1929 Today's Date: 09/22/2013 Time: 6226-3335 PT Time Calculation (min): 24 min  PT Assessment / Plan / Recommendation History of Present Illness   [name] is a 80 y.o. female  With a history of hypothyroidism, hypertension, spinal stenosis, COPD, and diabetes mellitus. The patient states that within the last couple of weeks she has been feeling more weak. States that within last 3 days the weakness has gotten noticeably worse. She states that at this point she has been having trouble ambulating. Her husband reports that, per my discussion with the ED physian, that he is unable to take care of her at home. The patient denies any recent fevers or chills. Given the persistence in symptoms and worsening of symptoms patient decided to come to the ED for further evaluation. Since onset the problem has been persistent and gradually getting worse. Patient also endorses poor oral intake.  While in the ED patient had CT of spine which reported multilevel areas of mild to moderate multifactorial canal stenosis. Patient was afebrile.   Clinical Impression  Pt adm due the above. Presents with limitations in functional mobility secondary to deficits indicated below. Pt to benefit from acute skilled PT to address deficits listed below and increase mobility. Pt is highly motivated to ambulate independently. Pt will require post acute rehab at SNF to increase independence prior to returning home.    PT Assessment  Patient needs continued PT services    Follow Up Recommendations  Supervision/Assistance - 24 hour;SNF    Does the patient have the potential to tolerate intense rehabilitation      Barriers to Discharge        Equipment Recommendations  Other (comment) (TBD)    Recommendations for Other Services     Frequency Min 2X/week    Precautions / Restrictions Precautions Precautions:  Fall Restrictions Weight Bearing Restrictions: No   Pertinent Vitals/Pain 6/10; patient repositioned for comfort in chair      Mobility  Bed Mobility Overal bed mobility: Needs Assistance Bed Mobility: Rolling;Sidelying to Sit Rolling: Min assist Sidelying to sit: Mod assist General bed mobility comments: cues for log rolling technique to prevent twisting to back; pt requires (A) to bring trunk to sitting position; very effortful  Transfers Overall transfer level: Needs assistance Equipment used: 1 person hand held assist Transfers: Sit to/from Stand Sit to Stand: Min assist General transfer comment: cues for hand placement and sequencing; pt requries (A) to maintain balance; with standing has constant posterior lean Ambulation/Gait Ambulation/Gait assistance: Mod assist Ambulation Distance (Feet): 5 Feet (x2) Assistive device: 1 person hand held assist Gait Pattern/deviations: Decreased stride length;Step-through pattern;Narrow base of support;Shuffle;Leaning posteriorly Gait velocity: decreased Gait velocity interpretation: <1.8 ft/sec, indicative of risk for recurrent falls General Gait Details: pt very unsteady with ambulation; able to take minimal steps to [facility] and then to recliner; requires (A) to maintain balance throughout ambulation; pt leaning posteriorly; requries use of gt belt and handheld (A) to maintain balacne     Exercises General Exercises - Lower Extremity Ankle Circles/Pumps: AROM;Both;10 reps;Seated Long Arc Quad: AROM;Both;10 reps;Seated   PT Diagnosis: Difficulty walking;Generalized weakness;Acute pain  PT Problem List: Decreased strength;Decreased activity tolerance;Decreased mobility;Decreased balance;Decreased knowledge of use of DME;Decreased safety awareness;Pain;Impaired sensation;Cardiopulmonary status limiting activity PT Treatment Interventions: DME instruction;Gait training;Functional mobility training;Therapeutic activities;Therapeutic  exercise;Balance training;Neuromuscular re-education;Patient/family education     PT Goals(Current goals can be found in the care plan section) Acute Rehab PT  Goals Patient Stated Goal: to walk by myself PT Goal Formulation: With patient Time For Goal Achievement: 10/06/13 Potential to Achieve Goals: Fair  Visit Information  Last PT Received On: 09/22/13 Assistance Needed: +1 PT/OT/SLP Co-Evaluation/Treatment: Yes Reason for Co-Treatment: Complexity of the patient's impairments (multi-system involvement) PT goals addressed during session: Mobility/safety with mobility;Balance History of Present Illness:  [name] is a 80 y.o. female  With a history of hypothyroidism, hypertension, spinal stenosis, COPD, and diabetes mellitus. The patient states that within the last couple of weeks she has been feeling more weak. States that within last 3 days the weakness has gotten noticeably worse. She states that at this point she has been having trouble ambulating. Her husband reports that, per my discussion with the ED physian, that he is unable to take care of her at home. The patient denies any recent fevers or chills. Given the persistence in symptoms and worsening of symptoms patient decided to come to the ED for further evaluation. Since onset the problem has been persistent and gradually getting worse. Patient also endorses poor oral intake.  While in the ED patient had CT of spine which reported multilevel areas of mild to moderate multifactorial canal stenosis. Patient was afebrile.        Prior Functioning  Home Living Family/patient expects to be discharged to:: Skilled nursing facility Living Arrangements: Spouse/significant other Additional Comments: Pt reports she has gone to [facility] before Prior Function Level of Independence: Needs assistance Gait / Transfers Assistance Needed: ambulatory with (A) from her husband  ADL's / Homemaking Assistance Needed: (A) from husband   Comments: pt reports she has had difficulty recently and requires incr help from husband for ADLs  Communication Communication: No difficulties Dominant Hand: Right    Cognition  Cognition Arousal/Alertness: Awake/alert Behavior During Therapy: WFL for tasks assessed/performed Overall Cognitive Status: Within Functional Limits for tasks assessed    Extremity/Trunk Assessment Upper Extremity Assessment Upper Extremity Assessment: Defer to OT evaluation Lower Extremity Assessment Lower Extremity Assessment: Generalized weakness (c/o Rt LE numbness/tingling ) Cervical / Trunk Assessment Cervical / Trunk Assessment: Normal   Balance Balance Overall balance assessment: History of Falls;Needs assistance Sitting-balance support: Feet unsupported;Feet supported;Bilateral upper extremity supported Sitting balance-Leahy Scale: Fair Sitting balance - Comments: pt tends to lean to Rt and posteriorly; correctable with cues  Postural control: Posterior lean;Right lateral lean Standing balance support: During functional activity;Single extremity supported Standing balance-Leahy Scale: Poor  End of Session PT - End of Session Equipment Utilized During Treatment: Gait belt;Oxygen (3L) Activity Tolerance: Patient tolerated treatment well Patient left: in chair;with call bell/phone within reach;with nursing/sitter in room Nurse Communication: Mobility status;Precautions  GP     Donell SievertWest, Marquasha Brutus N, [place] CarolinaPT [phone] 09/22/2013, 5:36 PM

## 2013-09-22 NOTE — Progress Notes (Signed)
INITIAL NUTRITION ASSESSMENT  DOCUMENTATION CODES Per approved criteria  -Severe malnutrition in the context of chronic illness   INTERVENTION: 1.  Magic cup BID, each supplement provides 290 kcals and 9 grams protein  NUTRITION DIAGNOSIS: Inadequate oral intake related to chronic illness as evidenced by patient report of PO intake and weight loss.   Goal: Patient to meet >/= 90% of estimated nutrition needs  Monitor:  Weight trends, lab trends, I/Os, PO intake and supplement acceptance  Reason for Assessment: Nutrition Consult and Malnutrition Screening Tool Risk  80 y.o. female  Admitting Dx: Weakness generalized  ASSESSMENT: Patient has a PMH of hypothyroidism, HTN, spinal stenosis, COPD, and diabetes mellitus. Patient was admitted on 2/3 with dysuria and weakness.   Patient reports that her appetite has been poor over the past month. Patient stated that she often skips meals and has a hard time eating due to nausea with food intake. Per patient report, patient consumed 25% of breakfast on 2/4.   Patient reported that her usual weight is around 130 lb and she feels that she has been losing weight over the past month. Patient stated that her clothes do not fit well anymore because they are too big.   Patient stated that Ensure makes her sick, but she would  Try magic cup ice cream BID.  Nutrition Focused Physical Exam:  Subcutaneous Fat:  Orbital Region: WNL Upper Arm Region: moderate depletion Thoracic and Lumbar Region: N/A  Muscle:  Temple Region: mild depletion Clavicle Bone Region: severe depletion Clavicle and Acromion Bone Region: severe depletion Scapular Bone Region: N/A Dorsal Hand: severe depletion Patellar Region: WNL Anterior Thigh Region: WNL Posterior Calf Region: WNL  Edema: absent  Patient meets criteria for severe malnutrition in the context of chronic illness as evidenced by >5% weight loss in 1 month and severe muscle mass depletion.     Potassium is low at 3.5 - ordered for KCl. Magnesium and phosphorus are WNL.   Height: Ht Readings from Last 1 Encounters:  09/22/13 5\' 4"  (1.626 m)    Weight: Wt Readings from Last 1 Encounters:  09/21/13 117 lb 15.1 oz (53.5 kg)    Ideal Body Weight: 120 lb (54.5 kg)  % Ideal Body Weight: 97%  Wt Readings from Last 10 Encounters:  09/21/13 117 lb 15.1 oz (53.5 kg)  09/15/13 124 lb (56.246 kg)  08/30/13 128 lb 8 oz (58.287 kg)  08/16/13 131 lb (59.421 kg)  05/24/13 120 lb (54.432 kg)  03/05/13 119 lb 9.6 oz (54.25 kg)  10/28/12 125 lb (56.7 kg)  10/28/12 125 lb (56.7 kg)  08/24/12 133 lb 6.4 oz (60.51 kg)  05/07/12 129 lb 12.8 oz (58.877 kg)    Usual Body Weight: 130 lb (59 kg)  % Usual Body Weight: 90%  BMI:  Body mass index is 20.24 kg/(m^2).  Estimated Nutritional Needs: Kcal: 1400-1600  Protein: 70-80 grams Fluid: 1.5 L  Skin: no wounds  Diet Order: General  EDUCATION NEEDS: -No education needs identified at this time   Intake/Output Summary (Last 24 hours) at 09/22/13 0958 Last data filed at 09/22/13 0000  Gross per 24 hour  Intake      0 ml  Output    100 ml  Net   -100 ml    Last BM: 2/2  Labs:   Recent Labs Lab 09/15/13 1359 09/21/13 1514 09/21/13 2203 09/22/13 0548  NA 136 143  --  141  K 3.9 3.5*  --  3.5*  CL 101  101  --  101  CO2 25 28  --  27  BUN 14 12  --  10  CREATININE 1.39* 1.17*  --  1.09  CALCIUM 8.8 8.9  --  8.9  MG 1.8  --  2.0  --   PHOS  --   --  3.0  --   GLUCOSE 177* 115*  --  112*    CBG (last 3)   Recent Labs  09/21/13 2114 09/22/13 0741  GLUCAP 95 110*    Scheduled Meds: . amLODipine  5 mg Oral Daily  . carvedilol  6.25 mg Oral BID AC  . heparin  5,000 Units Subcutaneous Q8H  . insulin aspart  0-9 Units Subcutaneous TID WC  . levothyroxine  112 mcg Oral QAC breakfast  . metoCLOPramide  5 mg Oral Daily  . multivitamin with minerals  1 tablet Oral Daily  . pantoprazole  40 mg Oral Daily   . potassium chloride  40 mEq Oral Once  . tiotropium  18 mcg Inhalation Daily    Continuous Infusions: . dextrose 5 % and 0.45 % NaCl with KCl 20 mEq/L 90 mL/hr at 09/21/13 2314    Past Medical History  Diagnosis Date  . CONGESTIVE HEART FAILURE 06/03/2007  . COPD 06/03/2007  . HYPERTENSION 06/03/2007  . HYPOTHYROIDISM 06/03/2007  . Hernia   . Asthma   . Diabetes mellitus   . Hyperlipidemia   . Arthritis   . Osteoporosis   . Cardiomyopathy, nonischemic     Irritable ejection fractions most recently EF 60% 3/14>> EF 30% 10/14  . Biventricular pacemaker -[employer]tr[employer]]     Downgrade from CRT-D; LV and RV rate sense portions switched with subsequent fracture of morning rate sense portion in the LV port  . 6949-lead     Rate sense no LV port with subsequent fracture; high-voltage inactivated following down rate    Past Surgical History  Procedure Laterality Date  . Cardiac defibrillator placement    . Appendectomy    . Hysterectomy - unknown type    . Posterior fusion clivus-c2 transarticular stealth guided w/ icbg    . Cystectomy      elbow  . Icd      medtronic maximo  . Polypectomy      bladder  . Esophagogastroduodenoscopy N/A 10/30/2012    Procedure: ESOPHAGOGASTRODUODENOSCOPY (EGD);  Surgeon: Vertell NovakJames L Edwards Jr., MD;  [facility];  Service: Endoscopy;  Laterality: N/A;    Marlane MingleAshley Bower, Dietetic Intern Pager: [phone]  I agree with the above information and made appropriate revisions. Jarold MottoSamantha Madsen Riddle MS, RD, LDN Pager: [phone] After-hours pager: [phone]

## 2013-09-22 NOTE — Progress Notes (Deleted)
Pt family member refusing to have patient on bed alarm. Pt and family member made aware to call before getting oob. Will continue to monitor.

## 2013-09-22 NOTE — Evaluation (Signed)
Occupational Therapy Evaluation Patient Details Name: [name] MRN: [id number] DOB: 11-15-1929 Today's Date: 09/22/2013 Time: 9311-2162 OT Time Calculation (min): 16 min  OT Assessment / Plan / Recommendation History of present illness  [name] is a 80 y.o. female  With a history of hypothyroidism, hypertension, spinal stenosis, COPD, and diabetes mellitus. The patient states that within the last couple of weeks she has been feeling more weak. States that within last 3 days the weakness has gotten noticeably worse. She states that at this point she has been having trouble ambulating. Her husband reports that, per my discussion with the ED physian, that he is unable to take care of her at home. The patient denies any recent fevers or chills. Given the persistence in symptoms and worsening of symptoms patient decided to come to the ED for further evaluation. Since onset the problem has been persistent and gradually getting worse. Patient also endorses poor oral intake.  While in the ED patient had CT of spine which reported multilevel areas of mild to moderate multifactorial canal stenosis. Patient was afebrile.    Clinical Impression   Pt presents with below problem list. Feel pt will benefit from acute OT to increase independence prior to d/c. Recommending SNF for additional rehab prior to d/c home.     OT Assessment  Patient needs continued OT Services    Follow Up Recommendations  SNF;Supervision/Assistance - 24 hour    Barriers to Discharge      Equipment Recommendations  Other (comment) (defer to next venue)    Recommendations for Other Services    Frequency  Min 2X/week    Precautions / Restrictions Precautions Precautions: Fall Restrictions Weight Bearing Restrictions: No   Pertinent Vitals/Pain Pain in back, but not rated when OT in room. Increased activity.     ADL  Eating/Feeding: Independent Where Assessed - Eating/Feeding: Chair Grooming:  Wash/dry face;Set up;Supervision/safety Where Assessed - Grooming: Unsupported sitting (EOB) Upper Body Bathing: Set up;Supervision/safety Where Assessed - Upper Body Bathing: Supported sitting Lower Body Bathing: Moderate assistance Where Assessed - Lower Body Bathing: Supported sit to stand Upper Body Dressing: Set up Where Assessed - Upper Body Dressing: Supported sitting Lower Body Dressing: Maximal assistance Where Assessed - Lower Body Dressing: Supported sit to Pharmacist, hospital: Minimal Dentist Method: Sit to Barista: Bedside commode Toileting - Clothing Manipulation and Hygiene: Maximal assistance Where Assessed - Engineer, mining and Hygiene: Sit to stand from 3-in-1 or toilet Tub/Shower Transfer Method: Not assessed Equipment Used: Gait belt Transfers/Ambulation Related to ADLs: Mod A for ambulation. Min A for transfers. ADL Comments: Briefly explained to pt that AE is available for LB ADLs and pt seemed interested in this.     OT Diagnosis: Generalized weakness;Acute pain  OT Problem List: Decreased strength;Decreased activity tolerance;Impaired balance (sitting and/or standing);Decreased knowledge of use of DME or AE;Decreased knowledge of precautions;Cardiopulmonary status limiting activity;Pain OT Treatment Interventions: Self-care/ADL training;Therapeutic exercise;DME and/or AE instruction;Therapeutic activities;Patient/family education;Balance training;Energy conservation   OT Goals(Current goals can be found in the care plan section) Acute Rehab OT Goals Patient Stated Goal: to walk by myself OT Goal Formulation: With patient Time For Goal Achievement: 09/29/13 Potential to Achieve Goals: Good ADL Goals Pt Will Perform Grooming: with supervision;standing Pt Will Perform Lower Body Bathing: with supervision;sit to/from stand;with adaptive equipment Pt Will Perform Lower Body Dressing: with supervision;with  adaptive equipment;sit to/from stand Pt Will Transfer to Toilet: with supervision;ambulating;bedside commode Pt Will Perform Toileting -  Clothing Manipulation and hygiene: with supervision;sit to/from stand  Visit Information  Last OT Received On: 09/22/13 Assistance Needed: +1 PT/OT/SLP Co-Evaluation/Treatment: Yes Reason for Co-Treatment: Complexity of the patient's impairments (multi-system involvement) PT goals addressed during session: Mobility/safety with mobility;Balance OT goals addressed during session: ADL's and self-care History of Present Illness:  [name] is a 80 y.o. female  With a history of hypothyroidism, hypertension, spinal stenosis, COPD, and diabetes mellitus. The patient states that within the last couple of weeks she has been feeling more weak. States that within last 3 days the weakness has gotten noticeably worse. She states that at this point she has been having trouble ambulating. Her husband reports that, per my discussion with the ED physian, that he is unable to take care of her at home. The patient denies any recent fevers or chills. Given the persistence in symptoms and worsening of symptoms patient decided to come to the ED for further evaluation. Since onset the problem has been persistent and gradually getting worse. Patient also endorses poor oral intake.  While in the ED patient had CT of spine which reported multilevel areas of mild to moderate multifactorial canal stenosis. Patient was afebrile.        Prior Functioning     Home Living Family/patient expects to be discharged to:: Skilled nursing facility Living Arrangements: Spouse/significant other Additional Comments: Pt reports she has gone to [facility] before Prior Function Level of Independence: Needs assistance Gait / Transfers Assistance Needed: ambulatory with (A) from her husband  ADL's / Homemaking Assistance Needed: assist with bathing and dressing Communication Communication: No  difficulties Dominant Hand: Right         Vision/Perception     Cognition  Cognition Arousal/Alertness: Awake/alert Behavior During Therapy: WFL for tasks assessed/performed Overall Cognitive Status: Within Functional Limits for tasks assessed    Extremity/Trunk Assessment Upper Extremity Assessment Upper Extremity Assessment: Generalized weakness Lower Extremity Assessment Lower Extremity Assessment: Defer to PT evaluation Cervical / Trunk Assessment Cervical / Trunk Assessment: Normal     Mobility Bed Mobility Overal bed mobility: Needs Assistance Bed Mobility: Rolling;Sidelying to Sit Rolling: Min assist Sidelying to sit: Mod assist General bed mobility comments: cues for log rolling technique to prevent twisting to back; pt requires (A) to bring trunk to sitting position; very effortful  Transfers Overall transfer level: Needs assistance Equipment used: 1 person hand held assist Transfers: Sit to/from Stand Sit to Stand: Min assist General transfer comment: cues for hand placement and sequencing; pt requries (A) to maintain balance; with standing has constant posterior lean        Balance Balance Overall balance assessment: History of Falls;Needs assistance Sitting-balance support: Feet unsupported;Feet supported;Bilateral upper extremity supported Sitting balance-Leahy Scale: Fair Sitting balance - Comments: pt tends to lean to Rt and posteriorly; correctable with cues  Postural control: Posterior lean;Right lateral lean Standing balance support: During functional activity;Single extremity supported Standing balance-Leahy Scale: Poor   End of Session OT - End of Session Equipment Utilized During Treatment: Gait belt;Oxygen Activity Tolerance: Patient tolerated treatment well Patient left: in chair;Other (comment) (with PT)  GO     Earlie RavelingStraub, Elke Holtry L OTR/L [phone] 09/22/2013, 6:08 PM

## 2013-09-23 DIAGNOSIS — E43 Unspecified severe protein-calorie malnutrition: Secondary | ICD-10-CM | POA: Insufficient documentation

## 2013-09-23 DIAGNOSIS — R059 Cough, unspecified: Secondary | ICD-10-CM

## 2013-09-23 DIAGNOSIS — R05 Cough: Secondary | ICD-10-CM

## 2013-09-23 LAB — URINE CULTURE
CULTURE: NO GROWTH
Colony Count: NO GROWTH

## 2013-09-23 LAB — GLUCOSE, CAPILLARY
GLUCOSE-CAPILLARY: 127 mg/dL — AB (ref 70–99)
GLUCOSE-CAPILLARY: 86 mg/dL (ref 70–99)
Glucose-Capillary: 97 mg/dL (ref 70–99)
Glucose-Capillary: 114 mg/dL — ABNORMAL HIGH (ref 70–99)

## 2013-09-23 MED ORDER — LIDOCAINE 5 % EX PTCH
1.0000 | MEDICATED_PATCH | CUTANEOUS | Status: DC
Start: 2013-09-23 — End: 2013-09-24
  Administered 2013-09-23 – 2013-09-24 (×2): 1 via TRANSDERMAL
  Filled 2013-09-23 (×2): qty 1

## 2013-09-23 MED ORDER — ONDANSETRON HCL 4 MG/2ML IJ SOLN
4.0000 mg | Freq: Four times a day (QID) | INTRAMUSCULAR | Status: DC | PRN
  Administered 2013-09-23: 4 mg via INTRAVENOUS

## 2013-09-23 MED ORDER — TRAMADOL HCL 50 MG PO TABS
50.0000 mg | ORAL_TABLET | Freq: Four times a day (QID) | ORAL | Status: DC | PRN
  Administered 2013-09-23 – 2013-09-24 (×5): 50 mg via ORAL
  Filled 2013-09-23 (×5): qty 1

## 2013-09-23 MED ORDER — GUAIFENESIN 100 MG/5ML PO SYRP
200.0000 mg | ORAL_SOLUTION | ORAL | Status: DC | PRN
  Administered 2013-09-23: 200 mg via ORAL
  Filled 2013-09-23: qty 10

## 2013-09-23 NOTE — Progress Notes (Signed)
[facility] PROGRESS NOTE  [name] ZOX:[id number] DOB: [birth year] DOA: 09/21/2013 PCP: Nadean Corwin, MD  Assessment/Plan: Generalized weakness  - ?URI- has a productive cough- will give azithromycin but no PNA on x ray- requiring O2 currently- wean off as tolerated -nebs -PT -- SNF - TSH ok -magnesium, phosphorus ok -suspect dehydration with some renal insuff  Spinal stenosis  - Continue pain control  - CT of spine obtained in the ED showing multilevel areas of mild to moderate multifactorial canal stenosis  -SNF -add lidocaine patch and PRN ultram for pain control- codeine listed as allergy- will monitor  Hypothyroidism  - continue home regimen Synthroid   Diabetes mellitus type 2  - Sliding scale insulin  - Hold oral hypoglycemic agents   Hypertension  - Stable continue home regimen   COPD  -Stable  Anemia -baseline about 9.8 -suspect she came in with dehydration  Code Status: full Family Communication: patient Disposition Plan: SNf 1-2 days   Consultants:  none  Procedures:  none  Antibiotics:  azithro 2/4  HPI/Subjective: Breathing better but back pain worsened  Objective: Filed Vitals:   09/23/13 0824  BP: 135/48  Pulse: 70  Temp: 98.5 F (36.9 C)  Resp: 18    Intake/Output Summary (Last 24 hours) at 09/23/13 0941 Last data filed at 09/23/13 8119  Gross per 24 hour  Intake   2955 ml  Output    150 ml  Net   2805 ml   Filed Weights   09/21/13 2112 09/22/13 2100  Weight: 53.5 kg (117 lb 15.1 oz) 53.499 kg (117 lb 15.1 oz)    Exam:   General:  A+Ox3, thin female- chronically ill  Cardiovascular: rrr  Respiratory: clear  Abdomen: +BS, soft  Musculoskeletal: moves all 4 ext   Data Reviewed: Basic Metabolic Panel:  Recent Labs Lab 09/21/13 1514 09/21/13 2203 09/22/13 0548  NA 143  --  141  K 3.5*  --  3.5*  CL 101  --  101  CO2 28  --  27  GLUCOSE 115*  --  112*  BUN 12  --  10   CREATININE 1.17*  --  1.09  CALCIUM 8.9  --  8.9  MG  --  2.0  --   PHOS  --  3.0  --    Liver Function Tests:  Recent Labs Lab 09/21/13 1514  AST 15  ALT 12  ALKPHOS 97  BILITOT <0.2*  PROT 6.9  ALBUMIN 3.2*   No results found for this basename: LIPASE, AMYLASE,  in the last 168 hours No results found for this basename: AMMONIA,  in the last 168 hours CBC:  Recent Labs Lab 09/21/13 1514 09/22/13 0548  WBC 8.9 6.7  NEUTROABS 6.3  --   HGB 11.1* 9.7*  HCT 36.0 31.3*  MCV 82.6 82.8  PLT 309 275   Cardiac Enzymes: No results found for this basename: CKTOTAL, CKMB, CKMBINDEX, TROPONINI,  in the last 168 hours BNP (last 3 results)  Recent Labs  11/02/12 1607 05/24/13 1323  PROBNP 6510.0* 90.0   CBG:  Recent Labs Lab 09/22/13 0741 09/22/13 1239 09/22/13 1704 09/22/13 2104 09/23/13 0738  GLUCAP 110* 124* 123* 156* 127*    Recent Results (from the past 240 hour(s))  URINE CULTURE     Status: None   Collection Time    09/21/13  3:04 PM      Result Value Range Status   Specimen Description URINE, CATHETERIZED   Final  Special Requests ADDED AT 1741 ON [id number]   Final   Culture  Setup Time     Final   Value: 09/22/2013 00:13     Performed at [facility]   Colony Count     Final   Value: NO GROWTH     Performed at [facility]   Culture     Final   Value: NO GROWTH     Performed at [facility]   Report Status 09/23/2013 FINAL   Final     Studies: Dg Chest 2 View  09/21/2013   CLINICAL DATA:  Short of breath  EXAM: CHEST  2 VIEW  COMPARISON:  06/03/2013  FINDINGS: COPD with hyperinflation. AICD is unchanged from the prior study. Of the lungs are clear without infiltrate effusion or heart failure.  IMPRESSION: COPD without acute abnormality.   Electronically Signed   By: Marlan Palau M.D.   On: 09/21/2013 17:09   Ct Lumbar Spine Wo Contrast  09/21/2013   CLINICAL DATA:  Back pain  EXAM: CT LUMBAR SPINE WITHOUT CONTRAST   TECHNIQUE: Multidetector CT imaging of the lumbar spine was performed without intravenous contrast administration. Multiplanar CT image reconstructions were also generated.  COMPARISON:  None.  FINDINGS: Diffuse heterogeneous attenuation is appreciated throughout the lumbar vertebra. There is no evidence vertebral body collapse. Areas of central endplate depression identified inferiorly L1, inferiorly L2, superior and inferior portions of L3, inferior aspect of L for as well as see and superior aspect of L5. Regions of sclerosis are appreciated adjacent to the areas of central depression. These findings have the appearance of Schmorl's nodes. No focal sclerotic. Lesion is appreciated. There are areas of mild disc space narrowing throughout the lumbar spine as well as areas of anterior endplate hypertrophic spurring.  At the L1-2 level a broad-based disc bulge is appreciated causing partial effacement of the anterior CSF space and resulting in mild canal narrowing. At the L2-3 level a broad-based disc bulge is appreciated causing effacement of the CSF space also resulting in mild canal narrowing. At the L3-4 level a broad-based disc bulge is appreciated as well as endplate hypertrophic spurring. These findings contribute to mild canal stenosis. At the L4-5 level a broad-based disc bulge is appreciated as well as ligamentum flavum hypertrophy. Mild canal narrowing is identified at this level. At the L5-S1 level a broad-based disc bulge is appreciated as well as ligamentum flavum hypertrophy contributing to mild canal stenosis.  Facet hypertrophy is appreciated at the L2-3, L3-4, L4-5 and L5-S1 levels most prominent within the L3-4 and L5-S1 levels. These findings contribute to canal narrowing. There is not appear to be significant neural foraminal narrowing secondary to osseous etiologies. Areas of subarticular sclerosis with subchondral cyst formation is appreciated involving the sacroiliac joints primarily the iliac  component. The joint space appears to be maintained.  There is no evidence of abnormalities within the surrounding paraspinous musculature. Diffuse atherosclerotic calcifications are appreciated within the aorta.  IMPRESSION: Multilevel spondylolysis without evidence of acute osseous abnormalities. There are diffuse areas of sclerosis within the vertebral bodies which appear to be primarily focused along regions of a Schmorl's nodes. An underlying infiltrating the etiology cannot be excluded. If clinically appropriate.  There on the multilevel areas of mild to moderate multifactorial canal stenosis as described above.  Bilateral osteoarthritic changes appreciated involving the sacroiliac joints.   Electronically Signed   By: Salome Holmes M.D.   On: 09/21/2013 17:10    Scheduled Meds: .  amLODipine  5 mg Oral Daily  . azithromycin  250 mg Oral Daily  . carvedilol  6.25 mg Oral BID AC  . heparin  5,000 Units Subcutaneous Q8H  . insulin aspart  0-9 Units Subcutaneous TID WC  . levothyroxine  112 mcg Oral QAC breakfast  . lidocaine  1 patch Transdermal Q24H  . metoCLOPramide  5 mg Oral Daily  . multivitamin with minerals  1 tablet Oral Daily  . ondansetron (ZOFRAN) IV  4 mg Intravenous Q6H  . pantoprazole  40 mg Oral Daily  . tiotropium  18 mcg Inhalation Daily   Continuous Infusions:    Principal Problem:   Weakness generalized Active Problems:   HYPOTHYROIDISM   HYPERTENSION   COPD   Type II or unspecified type diabetes mellitus without mention of complication, not stated as uncontrolled   Spinal stenosis   Hypokalemia   Protein-calorie malnutrition, severe    Time spent: 35 min    VANN, JESSICA  [facility] Pager [phone]. If 7PM-7AM, please contact night-coverage at [email or web address], password [email or web address] 09/23/2013, 9:41 AM  LOS: 2 days

## 2013-09-23 NOTE — Clinical Social Work Psychosocial (Signed)
Clinical Social Work Department BRIEF PSYCHOSOCIAL ASSESSMENT 09/23/2013  Patient:  [name], [name]     Account Number:  [id number]     Admit date:  09/21/2013  Clinical Social Worker:  Delmer Islam  Date/Time:  09/23/2013 04:25 PM  Referred by:  Physician  Date Referred:  09/23/2013 Referred for  SNF Placement   Other Referral:   Interview type:  Family Other interview type:   Patient's husband Briceida Elfrink (H) [phone] (C) [phone].    PSYCHOSOCIAL DATA Living Status:  ALONE Admitted from facility:   Level of care:   Primary support name:  Esaw Grandchild Primary support relationship to patient:  SPOUSE Degree of support available:    CURRENT CONCERNS Current Concerns  Post-Acute Placement   Other Concerns:    SOCIAL WORK ASSESSMENT / PLAN CSW intern could not speak with patient about discharge plans because patient was sleeping. CSW intern contacted husband, Ailsa Massaquoi about patient's discharge plans. Jaylinn Ciccarelli was aware of patient having to go to a rehab facility and advised CSW intern that the patient has been to [facility] nursing facility. Duanne Guess mentioned that the patient would like to go to [facility] again when ready for discharge. CSW will follow up with Duanne Guess about placement.   Assessment/plan status:  Psychosocial Support/Ongoing Assessment of Needs Other assessment/ plan:   Information/referral to community resources:    PATIENT'S/FAMILY'S RESPONSE TO PLAN OF CARE: Patient was sleeping when CSW intern attempted to talk with. Patient's husband seem concerned and willing to talk with Child psychotherapist. CSW will follow up with patient's husband tomorrow about discharge plans.      Deniece Ree, CSW Intern.

## 2013-09-23 NOTE — Progress Notes (Signed)
Patient complained of difficulty breathing and increased wheezing.  Had patient sit up in bed.  Oxy sat 88% at 3L.  Albuterol nebulizer treatment administered; after treatment, oxy sat at 98%.  Pt had no other complaints.  Pt now at 4L.  Will continue to monitor.

## 2013-09-24 DIAGNOSIS — M545 Low back pain, unspecified: Secondary | ICD-10-CM

## 2013-09-24 LAB — BASIC METABOLIC PANEL
BUN: 8 mg/dL (ref 6–23)
CO2: 26 meq/L (ref 19–32)
Calcium: 8.7 mg/dL (ref 8.4–10.5)
Chloride: 98 mEq/L (ref 96–112)
Creatinine, Ser: 1.12 mg/dL — ABNORMAL HIGH (ref 0.50–1.10)
GFR calc non Af Amer: 44 mL/min — ABNORMAL LOW (ref >90)
GFR, EST AFRICAN AMERICAN: 52 mL/min — AB (ref >90)
Glucose, Bld: 100 mg/dL — ABNORMAL HIGH (ref 70–99)
POTASSIUM: 4.8 meq/L (ref 3.7–5.3)
Sodium: 137 mEq/L (ref 137–147)

## 2013-09-24 LAB — GLUCOSE, CAPILLARY
Glucose-Capillary: 100 mg/dL — ABNORMAL HIGH (ref 70–99)
Glucose-Capillary: 142 mg/dL — ABNORMAL HIGH (ref 70–99)

## 2013-09-24 LAB — CBC
HEMATOCRIT: 32 % — AB (ref 36.0–46.0)
Hemoglobin: 9.7 g/dL — ABNORMAL LOW (ref 12.0–15.0)
MCH: 25.1 pg — ABNORMAL LOW (ref 26.0–34.0)
MCHC: 30.3 g/dL (ref 30.0–36.0)
MCV: 82.9 fL (ref 78.0–100.0)
PLATELETS: ADEQUATE 10*3/uL (ref 150–400)
RBC: 3.86 MIL/uL — ABNORMAL LOW (ref 3.87–5.11)
RDW: 15.7 % — AB (ref 11.5–15.5)
WBC: 7.8 10*3/uL (ref 4.0–10.5)

## 2013-09-24 MED ORDER — ADULT MULTIVITAMIN W/MINERALS CH: 1.0000 | ORAL_TABLET | Freq: Every day | ORAL | Status: AC

## 2013-09-24 MED ORDER — LIDOCAINE 5 % EX PTCH: 1.0000 | MEDICATED_PATCH | CUTANEOUS | Status: DC

## 2013-09-24 MED ORDER — CLONAZEPAM 0.5 MG PO TABS: 0.2500 mg | ORAL_TABLET | Freq: Every evening | ORAL | Status: DC | PRN

## 2013-09-24 MED ORDER — TRAMADOL HCL 50 MG PO TABS: 50.0000 mg | ORAL_TABLET | Freq: Four times a day (QID) | ORAL | Status: DC | PRN

## 2013-09-24 MED ORDER — GUAIFENESIN 100 MG/5ML PO SYRP: 200.0000 mg | ORAL_SOLUTION | ORAL | Status: DC | PRN

## 2013-09-24 MED ORDER — AZITHROMYCIN 250 MG PO TABS: ORAL_TABLET | ORAL | Status: DC

## 2013-09-24 MED ORDER — LEVOTHYROXINE SODIUM 112 MCG PO TABS: 112.0000 ug | ORAL_TABLET | Freq: Every day | ORAL | Status: DC

## 2013-09-24 MED ORDER — ALBUTEROL SULFATE (2.5 MG/3ML) 0.083% IN NEBU: 3.0000 mL | INHALATION_SOLUTION | Freq: Four times a day (QID) | RESPIRATORY_TRACT | Status: DC | PRN

## 2013-09-24 NOTE — Progress Notes (Signed)
Physical Therapy Treatment Patient Details Name: [name] MRN: [id number] DOB: 1930-07-02 Today's Date: 09/24/2013 Time: 5188-4166 PT Time Calculation (min): 27 min  PT Assessment / Plan / Recommendation  History of Present Illness  [name] is a 80 y.o. female  With a history of hypothyroidism, hypertension, spinal stenosis, COPD, and diabetes mellitus. The patient states that within the last couple of weeks she has been feeling more weak. States that within last 3 days the weakness has gotten noticeably worse. She states that at this point she has been having trouble ambulating. CT of spine which reported multilevel areas of mild to moderate multifactorial canal stenosis.    PT Comments   Patient tearful upon my entry due to on bedpan and causing back pain.  Also little tearful due to pain in left knee with mobility.  Feel she is appropriate for SNF level rehab.  Follow Up Recommendations  Supervision/Assistance - 24 hour;SNF     Does the patient have the potential to tolerate intense rehabilitation   N/A  Barriers to Discharge  None      Equipment Recommendations  Other (comment)    Recommendations for Other Services  None  Frequency Min 2X/week   Progress towards PT Goals Progress towards PT goals: Progressing toward goals  Plan Current plan remains appropriate    Precautions / Restrictions Precautions Precautions: Fall   Pertinent Vitals/Pain Pain left knee with movement, not rated    Mobility  Bed Mobility Overal bed mobility: Needs Assistance Rolling: Supervision Sidelying to sit: Min assist General bed mobility comments: used rail to roll to get off bedpan and assist to sit pt using rail, to sidelying/supine, assist for feet and cues for technique Transfers Overall transfer level: Needs assistance Equipment used: Rolling walker (2 wheeled) Transfers: Sit to/from Stand Ambulation/Gait Ambulation/Gait assistance: Min assist Ambulation Distance  (Feet): 10 Feet Assistive device: Rolling walker (2 wheeled) Gait Pattern/deviations: Step-to pattern;Antalgic;Shuffle General Gait Details: short shuffling steps limited due to reports of left knee pain, assist to turn walker     Exercises General Exercises - Lower Extremity Short Arc Quad: AROM;Both;10 reps;Supine Heel Slides: AROM;AAROM;Both;5 reps;Supine     PT Goals (current goals can now be found in the care plan section)    Visit Information  Last PT Received On: 09/24/13 Assistance Needed: +1 History of Present Illness:  [name] is a 80 y.o. female  With a history of hypothyroidism, hypertension, spinal stenosis, COPD, and diabetes mellitus. The patient states that within the last couple of weeks she has been feeling more weak. States that within last 3 days the weakness has gotten noticeably worse. She states that at this point she has been having trouble ambulating. CT of spine which reported multilevel areas of mild to moderate multifactorial canal stenosis.     Subjective Data   I laid on the pan too long.   Cognition  Cognition Arousal/Alertness: Awake/alert Behavior During Therapy: Anxious Overall Cognitive Status: Within Functional Limits for tasks assessed    Balance  Balance Overall balance assessment: Needs assistance Sitting-balance support: Bilateral upper extremity supported Sitting balance-Leahy Scale: Poor Sitting balance - Comments: assist for balance due to posterior bias in sitting Standing balance support: Bilateral upper extremity supported Standing balance-Leahy Scale: Poor Standing balance comment: needs UE assist for balance  End of Session PT - End of Session Equipment Utilized During Treatment: Gait belt Activity Tolerance: Patient limited by pain Patient left: in bed;with call bell/phone within reach;with bed alarm set  GP     WYNN,CYNDI 09/24/2013, 2:52 PM Sheran Lawlessyndi Wynn, [place] [phone] 09/24/2013

## 2013-09-24 NOTE — Clinical Social Work Placement (Signed)
Clinical Social Work Department CLINICAL SOCIAL WORK PLACEMENT NOTE 09/24/2013  Patient:  [name], [name]  Account Number:  [id number] Admit date:  09/21/2013  Clinical Social Worker:  Genelle Bal, LCSW  Date/time:  09/24/2013 03:14 AM  Clinical Social Work is seeking post-discharge placement for this patient at the following level of care:   SKILLED NURSING   (*CSW will update this form in Epic as items are completed)     Patient/family provided with [facility] Department of Clinical Social Work's list of facilities offering this level of care within the geographic area requested by the patient (or if unable, by the patient's family).  09/24/2013  Patient/family informed of their freedom to choose among providers that offer the needed level of care, that participate in Medicare, Medicaid or managed care program needed by the patient, have an available bed and are willing to accept the patient.    Patient/family informed of [facility]' ownership interest in [facility], as well as of the fact that they are under no obligation to receive care at this facility.  PASARR submitted to EDS on  PASARR number received from EDS on   FL2 transmitted to all facilities in geographic area requested by pt/family on  09/24/2013 FL2 transmitted to all facilities within larger geographic area on   Patient informed that his/her managed care company has contracts with or will negotiate with  certain facilities, including the following:     Patient/family informed of bed offers received:  09/24/2013 Patient chooses bed at [facility], [place] Physician recommends and patient chooses bed at    [facility] [place] on  09/24/2013 Patient to be transferred to facility by ambulance  The following physician request were entered in Epic:   Additional Comments: 09/24/13 - Husband, [name] contacted and advised of  [facility] accepting patient. Husband will be contacted once ambulance arrives to transport patient.

## 2013-09-24 NOTE — Discharge Summary (Signed)
Physician Discharge Summary  [name] ZOX:[id number] DOB: 1929/11/04 DOA: 09/21/2013  PCP: Amy Corwin, MD  Admit date: 09/21/2013 Discharge date: 09/24/2013  Time spent: 35 minutes  Recommendations for Outpatient Follow-up:  1. Outpatient neurosurgery consult- spinal stenosis  Discharge Diagnoses:  Principal Problem:   Weakness generalized Active Problems:   HYPOTHYROIDISM   HYPERTENSION   COPD   Type II or unspecified type diabetes mellitus without mention of complication, not stated as uncontrolled   Spinal stenosis   Hypokalemia   Protein-calorie malnutrition, severe   Discharge Condition: improved  Diet recommendation: regular  Filed Weights   09/21/13 2112 09/22/13 2100  Weight: 53.5 kg (117 lb 15.1 oz) 53.499 kg (117 lb 15.1 oz)    History of present illness:  [name] is a 80 y.o. female  With a history of hypothyroidism, hypertension, spinal stenosis, COPD, and diabetes mellitus. The patient states that within the last couple of weeks she has been feeling more weak. States that within last 3 days the weakness has gotten noticeably worse. She states that at this point she has been having trouble ambulating. Her husband reports that, per my discussion with the ED physian, that he is unable to take care of her at home. The patient denies any recent fevers or chills. Given the persistence in symptoms and worsening of symptoms patient decided to come to the ED for further evaluation. Since onset the problem has been persistent and gradually getting worse. Patient also endorses poor oral intake  While in the ED patient had CT of spine which reported multilevel areas of mild to moderate multifactorial canal stenosis. Patient was afebrile. Given persistence of symptoms and difficulty ambulating we were consult at for further evaluation and recommendations.   Hospital Course:   Generalized weakness  - ?URI- has a productive cough- will give  azithromycin but no PNA on x ray- requiring O2 currently- wean off as tolerated- down from 4L to 2L -nebs   - TSH ok  -magnesium, phosphorus ok  -suspect dehydration with some renal insuff   Spinal stenosis  - Continue pain control  - CT of spine obtained in the ED showing multilevel areas of mild to moderate multifactorial canal stenosis  -SNF  -add lidocaine patch and PRN ultram for pain control -outpatient neurosurgery  Hypothyroidism  - continue home regimen Synthroid   Diabetes mellitus type 2  - Sliding scale insulin   Hypertension  - Stable continue home regimen   COPD  -nebs, wean off O2 as tolerated once over URI  Anemia  -baseline about 9.8  -suspect she came in with dehydration        Procedures:  none  Consultations:  none  Discharge Exam: Filed Vitals:   09/24/13 0453  BP: 116/60  Pulse: 72  Temp: 98 F (36.7 C)  Resp: 24    General: pleasant/cooeprative Cardiovascular: rrr Respiratory: mild expiratory wheezing  Discharge Instructions      Discharge Orders   Future Appointments Provider Department Dept Phone   09/28/2013 1:30 PM Leslye Peer, MD [facility] [phone]   10/22/2013 1:00 PM [facility] [address] [phone]   10/22/2013 1:30 PM [facility] [address] [phone]   10/22/2013 2:30 PM Fransisco Hertz, MD [facility] [phone]   11/17/2013 2:30 PM Lucky Cowboy, MD [facility] [phone]   11/30/2013 2:00 PM Minda Meo, PA-C [facility] [place] [facility] [phone]  05/23/2014 2:00 PM Lucky Cowboy, MD [facility] [phone]   Future Orders Complete By Expires   Discharge instructions  As directed    Comments:     Wean off O2 as tolerated Regular diet- encourage fluid intake   Increase activity slowly  As directed        Medication  List    STOP taking these medications       amitriptyline 10 MG tablet  Commonly known as:  ELAVIL     diltiazem 240 MG 24 hr capsule  Commonly known as:  DILACOR XR     diphenhydrAMINE 25 MG tablet  Commonly known as:  BENADRYL     gabapentin 300 MG capsule  Commonly known as:  NEURONTIN     levalbuterol 45 MCG/ACT inhaler  Commonly known as:  XOPENEX HFA  Replaced by:  albuterol (2.5 MG/3ML) 0.083% nebulizer solution      TAKE these medications       albuterol 108 (90 BASE) MCG/ACT inhaler  Commonly known as:  PROVENTIL HFA;VENTOLIN HFA  Inhale 1-2 puffs into the lungs every 4 (four) hours as needed for wheezing or shortness of breath.     albuterol (2.5 MG/3ML) 0.083% nebulizer solution  Commonly known as:  PROVENTIL  Inhale 3 mLs into the lungs every 6 (six) hours as needed for wheezing.     amLODipine 5 MG tablet  Commonly known as:  NORVASC  Take 1 tablet (5 mg total) by mouth daily.     azithromycin 250 MG tablet  Commonly known as:  ZITHROMAX  Until 2/8     carvedilol 6.25 MG tablet  Commonly known as:  COREG  Take 1 tablet (6.25 mg total) by mouth 2 (two) times daily.     cholecalciferol 1000 UNITS tablet  Commonly known as:  VITAMIN D  Take 1,000 Units by mouth daily as needed (for vitamin).     clonazePAM 0.5 MG tablet  Commonly known as:  KLONOPIN  Take 0.5 tablets (0.25 mg total) by mouth at bedtime as needed (sleep).     ferrous sulfate 325 (65 FE) MG tablet  Take 325 mg by mouth daily as needed (for iron).     guaifenesin 100 MG/5ML syrup  Commonly known as:  ROBITUSSIN  Take 10 mLs (200 mg total) by mouth every 4 (four) hours as needed for congestion.     levothyroxine 112 MCG tablet  Commonly known as:  SYNTHROID, LEVOTHROID  Take 1 tablet (112 mcg total) by mouth daily before breakfast.     lidocaine 5 %  Commonly known as:  LIDODERM  Place 1 patch onto the skin daily. Remove & Discard patch within 12 hours or as directed by MD      metoCLOPramide 5 MG tablet  Commonly known as:  REGLAN  Take 5 mg by mouth 3 (three) times daily before meals.     multivitamin with minerals Tabs tablet  Take 1 tablet by mouth daily.     omeprazole 20 MG capsule  Commonly known as:  PRILOSEC  Take 20 mg by mouth 2 (two) times daily.     SPIRIVA HANDIHALER 18 MCG inhalation capsule  Generic drug:  tiotropium  inhale the contents of one capsule in the handihaler once daily     traMADol 50 MG tablet  Commonly known as:  ULTRAM  Take 1 tablet (50 mg total) by mouth every 6 (six) hours as needed for moderate pain.  Allergies  Allergen Reactions  . Ace Inhibitors Nausea And Vomiting  . Acetaminophen-Codeine Nausea And Vomiting  . Aspirin     Stomach upset  . Atorvastatin Nausea And Vomiting    lipitor  . Bisoprolol Fumarate Nausea And Vomiting    zebeta  . Ezetimibe-Simvastatin Nausea And Vomiting    vytorin  . Fluvastatin Sodium Nausea And Vomiting    lescol  . Gabapentin Nausea And Vomiting    neurontin  . Gemfibrozil Nausea And Vomiting    Lopid   . Ibuprofen     Motrin- advil= stomach upset  . Lisinopril Nausea And Vomiting    Prinivil, zestril  . Oxycodone Hcl Nausea And Vomiting  . Rosuvastatin Nausea And Vomiting    crestor  . Tylenol [Acetaminophen]     Stomach upset      The results of significant diagnostics from this hospitalization (including imaging, microbiology, ancillary and laboratory) are listed below for reference.    Significant Diagnostic Studies: Dg Chest 2 View  09/21/2013   CLINICAL DATA:  Short of breath  EXAM: CHEST  2 VIEW  COMPARISON:  06/03/2013  FINDINGS: COPD with hyperinflation. AICD is unchanged from the prior study. Of the lungs are clear without infiltrate effusion or heart failure.  IMPRESSION: COPD without acute abnormality.   Electronically Signed   By: Marlan Palau M.D.   On: 09/21/2013 17:09   Ct Lumbar Spine Wo Contrast  09/21/2013   CLINICAL DATA:  Back pain  EXAM:  CT LUMBAR SPINE WITHOUT CONTRAST  TECHNIQUE: Multidetector CT imaging of the lumbar spine was performed without intravenous contrast administration. Multiplanar CT image reconstructions were also generated.  COMPARISON:  None.  FINDINGS: Diffuse heterogeneous attenuation is appreciated throughout the lumbar vertebra. There is no evidence vertebral body collapse. Areas of central endplate depression identified inferiorly L1, inferiorly L2, superior and inferior portions of L3, inferior aspect of L for as well as see and superior aspect of L5. Regions of sclerosis are appreciated adjacent to the areas of central depression. These findings have the appearance of Schmorl's nodes. No focal sclerotic. Lesion is appreciated. There are areas of mild disc space narrowing throughout the lumbar spine as well as areas of anterior endplate hypertrophic spurring.  At the L1-2 level a broad-based disc bulge is appreciated causing partial effacement of the anterior CSF space and resulting in mild canal narrowing. At the L2-3 level a broad-based disc bulge is appreciated causing effacement of the CSF space also resulting in mild canal narrowing. At the L3-4 level a broad-based disc bulge is appreciated as well as endplate hypertrophic spurring. These findings contribute to mild canal stenosis. At the L4-5 level a broad-based disc bulge is appreciated as well as ligamentum flavum hypertrophy. Mild canal narrowing is identified at this level. At the L5-S1 level a broad-based disc bulge is appreciated as well as ligamentum flavum hypertrophy contributing to mild canal stenosis.  Facet hypertrophy is appreciated at the L2-3, L3-4, L4-5 and L5-S1 levels most prominent within the L3-4 and L5-S1 levels. These findings contribute to canal narrowing. There is not appear to be significant neural foraminal narrowing secondary to osseous etiologies. Areas of subarticular sclerosis with subchondral cyst formation is appreciated involving the  sacroiliac joints primarily the iliac component. The joint space appears to be maintained.  There is no evidence of abnormalities within the surrounding paraspinous musculature. Diffuse atherosclerotic calcifications are appreciated within the aorta.  IMPRESSION: Multilevel spondylolysis without evidence of acute osseous abnormalities. There are diffuse areas of sclerosis  within the vertebral bodies which appear to be primarily focused along regions of a Schmorl's nodes. An underlying infiltrating the etiology cannot be excluded. If clinically appropriate.  There on the multilevel areas of mild to moderate multifactorial canal stenosis as described above.  Bilateral osteoarthritic changes appreciated involving the sacroiliac joints.   Electronically Signed   By: Salome HolmesHector  Cooper M.D.   On: 09/21/2013 17:10    Microbiology: Recent Results (from the past 240 hour(s))  URINE CULTURE     Status: None   Collection Time    09/21/13  3:04 PM      Result Value Range Status   Specimen Description URINE, CATHETERIZED   Final   Special Requests ADDED AT 1741 ON [id number]   Final   Culture  Setup Time     Final   Value: 09/22/2013 00:13     Performed at [facility]   Colony Count     Final   Value: NO GROWTH     Performed at [facility]   Culture     Final   Value: NO GROWTH     Performed at [facility]   Report Status 09/23/2013 FINAL   Final     Labs: Basic Metabolic Panel:  Recent Labs Lab 09/21/13 1514 09/21/13 2203 09/22/13 0548 09/24/13 0340  NA 143  --  141 137  K 3.5*  --  3.5* 4.8  CL 101  --  101 98  CO2 28  --  27 26  GLUCOSE 115*  --  112* 100*  BUN 12  --  10 8  CREATININE 1.17*  --  1.09 1.12*  CALCIUM 8.9  --  8.9 8.7  MG  --  2.0  --   --   PHOS  --  3.0  --   --    Liver Function Tests:  Recent Labs Lab 09/21/13 1514  AST 15  ALT 12  ALKPHOS 97  BILITOT <0.2*  PROT 6.9  ALBUMIN 3.2*   No results found for this basename: LIPASE,  AMYLASE,  in the last 168 hours No results found for this basename: AMMONIA,  in the last 168 hours CBC:  Recent Labs Lab 09/21/13 1514 09/22/13 0548 09/24/13 0340  WBC 8.9 6.7 7.8  NEUTROABS 6.3  --   --   HGB 11.1* 9.7* 9.7*  HCT 36.0 31.3* 32.0*  MCV 82.6 82.8 82.9  PLT 309 275 PLATELET CLUMPS NOTED ON SMEAR, COUNT APPEARS ADEQUATE   Cardiac Enzymes: No results found for this basename: CKTOTAL, CKMB, CKMBINDEX, TROPONINI,  in the last 168 hours BNP: BNP (last 3 results)  Recent Labs  11/02/12 1607 05/24/13 1323  PROBNP 6510.0* 90.0   CBG:  Recent Labs Lab 09/23/13 0738 09/23/13 1124 09/23/13 1622 09/23/13 2021 09/24/13 0746  GLUCAP 127* 97 86 114* 100*       Signed:  Jahzier Villalon  [facility] 09/24/2013, 10:30 AM

## 2013-09-24 NOTE — Progress Notes (Signed)
Patient was discharged to [facility] by Ambulance.  Patient was discharged with belongings.  Family notified.  Report was called prior to patient being discharged.  Patient was stable upon discharge.

## 2013-09-28 ENCOUNTER — Ambulatory Visit: Payer: Medicare Other | Admitting: Emergency Medicine

## 2013-09-30 ENCOUNTER — Encounter: Payer: Medicare Other | Admitting: Internal Medicine

## 2013-10-07 NOTE — Clinical Social Work Psychosocial (Signed)
Note reviewed and approved as written.  Vash Quezada, MSW, LCSW [phone] 

## 2013-10-19 ENCOUNTER — Telehealth: Payer: Self-pay

## 2013-10-19 DIAGNOSIS — J441 Chronic obstructive pulmonary disease with (acute) exacerbation: Secondary | ICD-10-CM

## 2013-10-19 DIAGNOSIS — M6281 Muscle weakness (generalized): Secondary | ICD-10-CM

## 2013-10-19 DIAGNOSIS — I5033 Acute on chronic diastolic (congestive) heart failure: Secondary | ICD-10-CM

## 2013-10-19 DIAGNOSIS — R269 Unspecified abnormalities of gait and mobility: Secondary | ICD-10-CM

## 2013-10-19 NOTE — Telephone Encounter (Signed)
Pt called confused about referrals made. Pt had referral to see vein & vascular and neuro. Called pt to explain, but she wants to speak w/ Dr.Mck before scheduling neuro appt. Pt has appt 10-28-13.

## 2013-10-20 ENCOUNTER — Telehealth: Payer: Self-pay | Admitting: *Deleted

## 2013-10-20 NOTE — Telephone Encounter (Signed)
[name] called and requested 6 weeks for PT at 2 times a week for strengthening, transfer and gait training.  OK per Dr Oneta Rack.

## 2013-10-21 ENCOUNTER — Encounter: Payer: Self-pay | Admitting: Vascular Surgery

## 2013-10-22 ENCOUNTER — Other Ambulatory Visit: Payer: Self-pay | Admitting: Vascular Surgery

## 2013-10-22 ENCOUNTER — Ambulatory Visit (INDEPENDENT_AMBULATORY_CARE_PROVIDER_SITE_OTHER): Payer: Medicare Other | Admitting: Vascular Surgery

## 2013-10-22 ENCOUNTER — Ambulatory Visit (INDEPENDENT_AMBULATORY_CARE_PROVIDER_SITE_OTHER)
Admission: RE | Admit: 2013-10-22 | Discharge: 2013-10-22 | Disposition: A | Discharge status: Home / Self Care | Payer: Medicare Other | Source: Ambulatory Visit | Attending: Vascular Surgery | Admitting: Vascular Surgery

## 2013-10-22 ENCOUNTER — Ambulatory Visit (HOSPITAL_COMMUNITY)
Admission: RE | Admit: 2013-10-22 | Discharge: 2013-10-22 | Disposition: A | Discharge status: Home / Self Care | Payer: Medicare Other | Source: Ambulatory Visit | Attending: Vascular Surgery | Admitting: Vascular Surgery

## 2013-10-22 ENCOUNTER — Encounter: Payer: Self-pay | Admitting: Vascular Surgery

## 2013-10-22 VITALS — BP 148/71 | HR 90 | Ht 64.0 in | Wt 126.0 lb

## 2013-10-22 DIAGNOSIS — I70219 Atherosclerosis of native arteries of extremities with intermittent claudication, unspecified extremity: Secondary | ICD-10-CM

## 2013-10-22 DIAGNOSIS — M79609 Pain in unspecified limb: Secondary | ICD-10-CM

## 2013-10-22 NOTE — Progress Notes (Signed)
Referred by:  Lucky Cowboy, MD [address] [place], [place] [address]  Reason for referral: possible PAD  History of Present Illness  [name] is a 80 y.o. (1930/01/20) female who presents with chief complaint: L>R leg pain.  Onset of symptom occurred years ago.  Pain is described as sharp, burning and achy, located in R hip, severity 5-10/10, and associated with movement.  The patient has involuntary movement of her legs, possibly restless leg syndrome so she has constant pain.  She has difficulty with ambulation due pain in her joints.  She does not describe calf or foot pain.  Patient has attempted to treat this pain with multiple neurologic agents and pain rx.  The patient denies rest pain symptoms also and denies leg wounds/ulcers.  Atherosclerotic risk factors include: DM, hyperlipidemia, HTN, and former heavy smoker.  Past Medical History  Diagnosis Date  . CONGESTIVE HEART FAILURE 06/03/2007  . COPD 06/03/2007  . HYPERTENSION 06/03/2007  . HYPOTHYROIDISM 06/03/2007  . Hernia   . Asthma   . Diabetes mellitus   . Hyperlipidemia   . Arthritis   . Osteoporosis   . Cardiomyopathy, nonischemic     Irritable ejection fractions most recently EF 60% 3/14>> EF 30% 10/14  . Biventricular pacemaker -Medtronic]     Downgrade from CRT-D; LV and RV rate sense portions switched with subsequent fracture of morning rate sense portion in the LV port  . 6949-lead     Rate sense no LV port with subsequent fracture; high-voltage inactivated following down rate    Past Surgical History  Procedure Laterality Date  . Cardiac defibrillator placement    . Appendectomy    . Hysterectomy - unknown type    . Posterior fusion clivus-c2 transarticular stealth guided w/ icbg    . Cystectomy      elbow  . Icd      medtronic maximo  . Polypectomy      bladder  . Esophagogastroduodenoscopy N/A 10/30/2012    Procedure: ESOPHAGOGASTRODUODENOSCOPY (EGD);  Surgeon: Vertell Novak., MD;  Location: [facility];  Service: Endoscopy;  Laterality: N/A;    History   Social History  . Marital Status: Married    Spouse Name: N/A    Number of Children: N/A  . Years of Education: N/A   Occupational History  . retired Air traffic controller    Social History Main Topics  . Smoking status: Former Smoker -- 2.00 packs/day for 30 years    Quit date: 08/19/1996  . Smokeless tobacco: Never Used  . Alcohol Use: No  . Drug Use: No  . Sexual Activity: Not on file   Other Topics Concern  . Not on file   Social History Narrative  . No narrative on file    Family History  Problem Relation Age of Onset  . Heart disease Mother   . Breast cancer Mother   . Brain cancer Mother   . Cancer Mother   . Diabetes Mother   . Heart attack Mother     Current Outpatient Prescriptions on File Prior to Visit  Medication Sig Dispense Refill  . albuterol (PROVENTIL HFA;VENTOLIN HFA) 108 (90 BASE) MCG/ACT inhaler Inhale 1-2 puffs into the lungs every 4 (four) hours as needed for wheezing or shortness of breath.      [name] albuterol (PROVENTIL) (2.5 MG/3ML) 0.083% nebulizer solution Inhale 3 mLs into the lungs every 6 (six) hours as needed for wheezing.  75 mL  12  .  amLODipine (NORVASC) 5 MG tablet Take 1 tablet (5 mg total) by mouth daily.  30 tablet  11  . azithromycin (ZITHROMAX) 250 MG tablet Until 2/8  6 each  0  . carvedilol (COREG) 6.25 MG tablet Take 1 tablet (6.25 mg total) by mouth 2 (two) times daily.  60 tablet  6  . cholecalciferol (VITAMIN D) 1000 UNITS tablet Take 1,000 Units by mouth daily as needed (for vitamin).      . clonazePAM (KLONOPIN) 0.5 MG tablet Take 0.5 tablets (0.25 mg total) by mouth at bedtime as needed (sleep).  10 tablet  0  . ferrous sulfate 325 (65 FE) MG tablet Take 325 mg by mouth daily as needed (for iron).      [name] guaifenesin (ROBITUSSIN) 100 MG/5ML syrup Take 10 mLs (200 mg total) by mouth every 4 (four) hours as needed for congestion.  120 mL   0  . levothyroxine (SYNTHROID, LEVOTHROID) 112 MCG tablet Take 1 tablet (112 mcg total) by mouth daily before breakfast.      . lidocaine (LIDODERM) 5 % Place 1 patch onto the skin daily. Remove & Discard patch within 12 hours or as directed by MD  30 patch  0  . metoCLOPramide (REGLAN) 5 MG tablet Take 5 mg by mouth 3 (three) times daily before meals.      . Multiple Vitamin (MULTIVITAMIN WITH MINERALS) TABS tablet Take 1 tablet by mouth daily.      [name] omeprazole (PRILOSEC) 20 MG capsule Take 20 mg by mouth 2 (two) times daily.      [name] SPIRIVA HANDIHALER 18 MCG inhalation capsule inhale the contents of one capsule in the handihaler once daily  30 capsule  6  . traMADol (ULTRAM) 50 MG tablet Take 1 tablet (50 mg total) by mouth every 6 (six) hours as needed for moderate pain.  30 tablet  0   No current facility-administered medications on file prior to visit.    Allergies  Allergen Reactions  . Ace Inhibitors Nausea And Vomiting  . Acetaminophen-Codeine Nausea And Vomiting  . Aspirin     Stomach upset  . Atorvastatin Nausea And Vomiting    lipitor  . Bisoprolol Fumarate Nausea And Vomiting    zebeta  . Ezetimibe-Simvastatin Nausea And Vomiting    vytorin  . Fluvastatin Sodium Nausea And Vomiting    lescol  . Gabapentin Nausea And Vomiting    neurontin  . Gemfibrozil Nausea And Vomiting    Lopid   . Ibuprofen     Motrin- advil= stomach upset  . Lisinopril Nausea And Vomiting    Prinivil, zestril  . Oxycodone Hcl Nausea And Vomiting  . Rosuvastatin Nausea And Vomiting    crestor  . Tylenol [Acetaminophen]     Stomach upset   REVIEW OF SYSTEMS:  (Positives checked otherwise negative)  CARDIOVASCULAR:  [ ]  chest pain, [x]  chest pressure, [ ]  palpitations, [ x] shortness of breath when laying flat, [x ] shortness of breath with exertion,   [x ] pain in feet when walking, [ ]  pain in feet when laying flat, [ ]  history of blood clot in veins (DVT), [ ]  history of phlebitis, [ ]   swelling in legs, [ ]  varicose veins  PULMONARY:  [ ]  productive cough, [ x] asthma, [ x] wheezing  NEUROLOGIC:  [x ] weakness in arms or legs, [ ]  numbness in arms or legs, [ ]  difficulty speaking or slurred speech, [ ]  temporary loss of vision in one eye, [ ]   dizziness  HEMATOLOGIC:  [ ]  bleeding problems, [ ]  problems with blood clotting too easily  MUSCULOSKEL:  [ ]  joint pain, [ ]  joint swelling  GASTROINTEST:  [ ]   Vomiting blood, [ ]   Blood in stool     GENITOURINARY:  [ ]   Burning with urination, [ ]   Blood in urine  PSYCHIATRIC:  [ ]  history of major depression  INTEGUMENTARY:  [ ]  rashes, [x ] ulcers  CONSTITUTIONAL:  [ ]  fever, [ ]  chills  For VQI Use Only  PRE-ADM LIVING: Home  AMB STATUS: Ambulatory with Assistance  CAD Sx: History of MI, but no symptoms No MI within 6 months  PRIOR CHF: Moderate  STRESS TEST: [x]  No, [ ]  Normal, [ ]  + ischemia, [ ]  + MI, [ ]  Both  Physical Examination Filed Vitals:   10/22/13 1442  BP: 148/71  Pulse: 90  Height: 5\' 4"  (1.626 m)  Weight: 126 lb (57.153 kg)  SpO2: 100%   Body mass index is 21.62 kg/(m^2).  General: A&O x 3, WD, elderly, ill appearing, on oxygen  Head: [place]/AT, Temporalis wasting   Ear/Nose/Throat: Hearing grossly intact, nares w/o erythema or drainage, oropharynx w/o Erythema/Exudate  Eyes: PERRLA, EOMI  Neck: Supple, no nuchal rigidity, no palpable LAD  Pulmonary: Sym exp, good air movt, CTAB, no rales, rhonchi, & wheezing  Cardiac: RRR, Nl S1, S2, no Murmurs, rubs or gallops  Vascular: Vessel Right Left  Radial Not Palpable Not Palpable  Brachial Not Palpable Not Palpable  Carotid Palpable, without bruit Palpable, without bruit  Aorta Not palpable N/A  Femoral Palpable Palpable  Popliteal Not palpable Not palpable  PT Not Palpable Faintly Palpable  DP Not Palpable Palpable   Gastrointestinal: soft, NTND, -G/R, - HSM, - masses, - CVAT B  Musculoskeletal: M/S 5/5 throughout , Extremities  without ischemic changes   Neurologic: CN 2-12 intact , Pain and light touch intact in extremities , Motor exam as listed above  Psychiatric: Judgment intact, Mood & affect appropriatefor pt's clinical situation  Dermatologic: See M/S exam for extremity exam, no rashes otherwise noted  Lymph : No Cervical, Axillary, or Inguinal lymphadenopathy   Non-Invasive Vascular Imaging  ABI (Date: 10/22/2013)  R: 0.65 (1.03), DP: mono, PT: bi, TBI: 0.27  L: 0.98 (1.01), DP: tri, PT: mono, TBI: 0.77  Outside Studies/Documentation 4 pages of outside documents were reviewed including: outpatient clinic chart.  Medical Decision Making  Sydnee LevansGearldeane B Dohmen is a 80 y.o. female who presents with: BLE intermittent claudication, possible restless leg syndrome, heart failure, oxygen dependent COPD, likely significant osteoarthritis, multiple other active medical problems   I doubt this patient is able to ambulate enough to even get vasculogenic claudication due to her compromise cardiopulmonary status.  Subsequently, I suspect that much of her sx are arthritic in nature.  Regards, I doubt she could survive an open vascular surgery.  At most, she would be a candidate for an endovascular procedure.  At this point, I would focus first on maximal medical management.  I discussed with the patient the natural history of intermittent claudication: 75% of patients have stable or improved symptoms in a year an only 2% require amputation. Eventually 20% may require intervention in a year.  I discussed in depth with the patient the nature of atherosclerosis, and emphasized the importance of maximal medical management including strict control of blood pressure, blood glucose, and lipid levels, antiplatelet agent, obtaining regular exercise, and cessation of smoking.    The patient  is aware that without maximal medical management the underlying atherosclerotic disease process will progress, limiting the benefit  of any interventions.  I doubt she can execute a walking plan due her cardiopulmonary limits. The patient is currently not on a statin: due to intolerance. The patient is currently not on an anti-platelet: due to intolerance.  This patient will continue to follow up with [place] in 3 months for repeat ABI.  If her ABI worsen or sx deteriorate, then we will consider proceeding with an aortogram, bilateral leg runoff, with possible R leg intervention.  Thank you for allowing [place] to participate in this patient's care.  Leonides Sake, MD [facility] Office: [phone] Pager: [phone]  10/22/2013, 5:44 PM

## 2013-10-25 NOTE — Addendum Note (Signed)
Addended by: Melodye Ped C on: 10/25/2013 11:57 AM   Modules accepted: Orders

## 2013-10-26 ENCOUNTER — Other Ambulatory Visit: Payer: Self-pay | Admitting: *Deleted

## 2013-10-26 MED ORDER — TRAMADOL HCL 50 MG PO TABS: 50.0000 mg | ORAL_TABLET | Freq: Four times a day (QID) | ORAL | Status: AC | PRN

## 2013-10-28 ENCOUNTER — Encounter: Payer: Self-pay | Admitting: Internal Medicine

## 2013-10-28 ENCOUNTER — Ambulatory Visit (INDEPENDENT_AMBULATORY_CARE_PROVIDER_SITE_OTHER): Payer: Medicare Other | Admitting: Internal Medicine

## 2013-10-28 ENCOUNTER — Other Ambulatory Visit: Payer: Self-pay | Admitting: Physician Assistant

## 2013-10-28 VITALS — BP 124/72 | HR 80 | Temp 98.6°F | Resp 18

## 2013-10-28 DIAGNOSIS — I5022 Chronic systolic (congestive) heart failure: Secondary | ICD-10-CM

## 2013-10-28 DIAGNOSIS — J449 Chronic obstructive pulmonary disease, unspecified: Secondary | ICD-10-CM

## 2013-10-28 DIAGNOSIS — Z79899 Other long term (current) drug therapy: Secondary | ICD-10-CM

## 2013-10-28 DIAGNOSIS — E039 Hypothyroidism, unspecified: Secondary | ICD-10-CM

## 2013-10-28 DIAGNOSIS — I2789 Other specified pulmonary heart diseases: Secondary | ICD-10-CM

## 2013-10-28 DIAGNOSIS — R7309 Other abnormal glucose: Secondary | ICD-10-CM

## 2013-10-28 DIAGNOSIS — I10 Essential (primary) hypertension: Secondary | ICD-10-CM

## 2013-10-28 MED ORDER — CLONAZEPAM 0.5 MG PO TABS: 0.2500 mg | ORAL_TABLET | Freq: Every evening | ORAL | Status: DC | PRN

## 2013-10-28 NOTE — Patient Instructions (Signed)
Chronic Obstructive Pulmonary Disease  Chronic obstructive pulmonary disease (COPD) is a common lung condition in which airflow from the lungs is limited. COPD is a general term that can be used to describe many different lung problems that limit airflow, including both chronic bronchitis and emphysema.  If you have COPD, your lung function will probably never return to normal, but there are measures you can take to improve lung function and make yourself feel better.   CAUSES   · Smoking (common).    · Exposure to secondhand smoke.    · Genetic problems.  · Chronic inflammatory lung diseases or recurrent infections.  SYMPTOMS   · Shortness of breath, especially with physical activity.    · Deep, persistent (chronic) cough with a large amount of thick mucus.    · Wheezing.    · Rapid breaths (tachypnea).    · Gray or bluish discoloration (cyanosis) of the skin, especially in fingers, toes, or lips.    · Fatigue.    · Weight loss.    · Frequent infections or episodes when breathing symptoms become much worse (exacerbations).    · Chest tightness.  DIAGNOSIS   Your healthcare provider will take a medical history and perform a physical examination to make the initial diagnosis.  Additional tests for COPD may include:   · Lung (pulmonary) function tests.  · Chest X-ray.  · CT scan.  · Blood tests.  TREATMENT   Treatment available to help you feel better when you have COPD include:   · Inhaler and nebulizer medicines. These help manage the symptoms of COPD and make your breathing more comfortable  · Supplemental oxygen. Supplemental oxygen is only helpful if you have a low oxygen level in your blood.    · Exercise and physical activity. These are beneficial for nearly all people with COPD. Some people may also benefit from a pulmonary rehabilitation program.  HOME CARE INSTRUCTIONS   · Take all medicines (inhaled or pills) as directed by your health care provider.  · Only take over-the-counter or prescription medicines  for pain, fever, or discomfort as directed by your health care provider.    · Avoid over-the-counter medicines or cough syrups that dry up your airway (such as antihistamines) and slow down the elimination of secretions unless instructed otherwise by your healthcare provider.    · If you are a smoker, the most important thing that you can do is stop smoking. Continuing to smoke will cause further lung damage and breathing trouble. Ask your health care provider for help with quitting smoking. He or she can direct you to community resources or hospitals that provide support.  · Avoid exposure to irritants such as smoke, chemicals, and fumes that aggravate your breathing.  · Use oxygen therapy and pulmonary rehabilitation if directed by your health care provider. If you require home oxygen therapy, ask your healthcare provider whether you should purchase a pulse oximeter to measure your oxygen level at home.    · Avoid contact with individuals who have a contagious illness.  · Avoid extreme temperature and humidity changes.  · Eat healthy foods. Eating smaller, more frequent meals and resting before meals may help you maintain your strength.  · Stay active, but balance activity with periods of rest. Exercise and physical activity will help you maintain your ability to do things you want to do.  · Preventing infection and hospitalization is very important when you have COPD. Make sure to receive all the vaccines your health care provider recommends, especially the pneumococcal and influenza vaccines. Ask your healthcare provider whether you   need a pneumonia vaccine.  · Learn and use relaxation techniques to manage stress.  · Learn and use controlled breathing techniques as directed by your health care provider. Controlled breathing techniques include:    · Pursed lip breathing. Start by breathing in (inhaling) through your nose for 1 second. Then, purse your lips as if you were going to whistle and breathe out (exhale)  through the pursed lips for 2 seconds.    · Diaphragmatic breathing. Start by putting one hand on your abdomen just above your waist. Inhale slowly through your nose. The hand on your abdomen should move out. Then purse your lips and exhale slowly. You should be able to feel the hand on your abdomen moving in as you exhale.    · Learn and use controlled coughing to clear mucus from your lungs. Controlled coughing is a series of short, progressive coughs. The steps of controlled coughing are:    1. Lean your head slightly forward.    2. Breathe in deeply using diaphragmatic breathing.    3. Try to hold your breath for 3 seconds.    4. Keep your mouth slightly open while coughing twice.    5. Spit any mucus out into a tissue.    6. Rest and repeat the steps once or twice as needed.  SEEK MEDICAL CARE IF:   · You are coughing up more mucus than usual.    · There is a change in the color or thickness of your mucus.    · Your breathing is more labored than usual.    · Your breathing is faster than usual.    SEEK IMMEDIATE MEDICAL CARE IF:   · You have shortness of breath while you are resting.    · You have shortness of breath that prevents you from:  · Being able to talk.    · Performing your usual physical activities.    · You have chest pain lasting longer than 5 minutes.    · Your skin color is more cyanotic than usual.  · You measure low oxygen saturations for longer than 5 minutes with a pulse oximeter.  MAKE SURE YOU:   · Understand these instructions.  · Will watch your condition.  · Will get help right away if you are not doing well or get worse.  Document Released: 05/15/2005 Document Revised: 05/26/2013 Document Reviewed: 04/01/2013  [employer]® Patient Information ©2014 [employer].

## 2013-10-28 NOTE — Progress Notes (Signed)
Subjective:    Patient ID: [name], female    DOB: 06/08/1930, 80 y.o.   MRN: [id number]  HPI Patient has multiple co-morbid conditions including HTN, COPD/Asthma, ASHD w/ Biv pacer,  GERD, Hypothyroid, DJD  and Vit B12 and D Deficiency. Patient was hospitalized  Feb 3-6 with Dehydration and LBP due to spinal stenosis and then went to [facility] for 3 week intensive physical therapy and now home 1 week has home physical therapy once weekly. Today she is in a wheelchair due to physical deconditioning. Also, she is on continuous O2 and reports at rest are 94% and drops to 87% (on O2) ambulating short distances.    Medication List       albuterol 108 (90 BASE) MCG/ACT inhaler  Commonly known as:  PROVENTIL HFA;VENTOLIN HFA  Inhale 1-2 puffs into the lungs every 4 (four) hours as needed for wheezing or shortness of breath.     albuterol (2.5 MG/3ML) 0.083% nebulizer solution  Commonly known as:  PROVENTIL  Inhale 3 mLs into the lungs every 6 (six) hours as needed for wheezing.     amLODipine 5 MG tablet  Commonly known as:  NORVASC  Take 1 tablet (5 mg total) by mouth daily.     carvedilol 6.25 MG tablet  Commonly known as:  COREG  Take 1 tablet (6.25 mg total) by mouth 2 (two) times daily.     cholecalciferol 1000 UNITS tablet  Commonly known as:  VITAMIN D  Take 1,000 Units by mouth daily as needed (for vitamin).     clonazePAM 0.5 MG tablet  Commonly known as:  KLONOPIN  Take 0.5 tablets (0.25 mg total) by mouth at bedtime as needed (sleep).     diltiazem 240 MG 24 hr capsule  Commonly known as:  CARDIZEM CD     guaifenesin 100 MG/5ML syrup  Commonly known as:  ROBITUSSIN  Take 10 mLs (200 mg total) by mouth every 4 (four) hours as needed for congestion.     levothyroxine 112 MCG tablet  Commonly known as:  SYNTHROID, LEVOTHROID  Take 1 tablet (112 mcg total) by mouth daily before breakfast.     lidocaine 5 %  Commonly known as:  LIDODERM  Place 1 patch onto  the skin daily. Remove & Discard patch within 12 hours or as directed by MD     metoCLOPramide 5 MG tablet  Commonly known as:  REGLAN  Take 5 mg by mouth 3 (three) times daily before meals.     multivitamin with minerals Tabs tablet  Take 1 tablet by mouth daily.     omeprazole 20 MG capsule  Commonly known as:  PRILOSEC  Take 20 mg by mouth 2 (two) times daily.     SPIRIVA HANDIHALER 18 MCG inhalation capsule  Generic drug:  tiotropium  inhale the contents of one capsule in the handihaler once daily     traMADol 50 MG tablet  Commonly known as:  ULTRAM  Take 1 tablet (50 mg total) by mouth every 6 (six) hours as needed for moderate pain.       Allergies  Allergen Reactions  . Ace Inhibitors Nausea And Vomiting  . Acetaminophen-Codeine Nausea And Vomiting  . Aspirin     Stomach upset  . Atorvastatin Nausea And Vomiting    lipitor  . Bisoprolol Fumarate Nausea And Vomiting    zebeta  . Ezetimibe-Simvastatin Nausea And Vomiting    vytorin  . Fluvastatin Sodium Nausea And Vomiting  lescol  . Gabapentin Nausea And Vomiting    neurontin  . Gemfibrozil Nausea And Vomiting    Lopid   . Ibuprofen     Motrin- advil= stomach upset  . Lisinopril Nausea And Vomiting    Prinivil, zestril  . Oxycodone Hcl Nausea And Vomiting  . Rosuvastatin Nausea And Vomiting    crestor  . Tylenol [Acetaminophen]     Stomach upset     Past Medical History  Diagnosis Date  . CONGESTIVE HEART FAILURE 06/03/2007  . COPD 06/03/2007  . HYPERTENSION 06/03/2007  . HYPOTHYROIDISM 06/03/2007  . Hernia   . Asthma   . Diabetes mellitus   . Hyperlipidemia   . Arthritis   . Osteoporosis   . Cardiomyopathy, nonischemic     Irritable ejection fractions most recently EF 60% 3/14>> EF 30% 10/14  . Biventricular pacemaker -Medtronic]     Downgrade from CRT-D; LV and RV rate sense portions switched with subsequent fracture of morning rate sense portion in the LV port  . 6949-lead     Rate  sense no LV port with subsequent fracture; high-voltage inactivated following down rate   Review of Systems negative except as above    Objective:   Physical Exam BP 124/72  Pulse 80  Temp(Src) 98.6 F (37 C) (Temporal)  Resp 18 HEENT - Eac's patent. TM's Nl.EOM's full. PERRLA. NasoOroPharynx clear. Neck - supple. Nl Thyroid. No bruits nodes JVD Chest -  Increased AP - Equal but distant BS with few scattered rales Cor - Nl HS. RRR w/o sig MGR. PP 1(+) No edema. Abd - No palpable organomegaly, masses or tenderness. BS nl. MS- FROM. w/o deformities. Muscle power tone and bulk decreased. In Wheelchair. Neuro - No obvious Cr N abnormalities. Sensory, motor and Cerebellar functions appear Nl w/o focal abnormalities.  Assessment & Plan:  1. Essential hypertension - Re-establish lab baselines  2. Obstructive chronic bronchitis   3. PreDiabetes - Hemoglobin A1c - Insulin, fasting  4. Encounter for long-term (current) use of other medications - BASIC METABOLIC PANEL WITH GFR - Hepatic function panel - Magnesium - TSH  ROV - 1 month

## 2013-10-29 LAB — INSULIN, FASTING: Insulin fasting, serum: 11 u[IU]/mL (ref 3–28)

## 2013-10-29 LAB — BASIC METABOLIC PANEL WITH GFR
BUN: 15 mg/dL (ref 6–23)
CHLORIDE: 98 meq/L (ref 96–112)
CO2: 27 meq/L (ref 19–32)
CREATININE: 1.06 mg/dL (ref 0.50–1.10)
Calcium: 9.1 mg/dL (ref 8.4–10.5)
GFR, Est African American: 57 mL/min — ABNORMAL LOW
GFR, Est Non African American: 49 mL/min — ABNORMAL LOW
Glucose, Bld: 78 mg/dL (ref 70–99)
Potassium: 4.2 mEq/L (ref 3.5–5.3)
Sodium: 137 mEq/L (ref 135–145)

## 2013-10-29 LAB — HEMOGLOBIN A1C
Hgb A1c MFr Bld: 6.3 % — ABNORMAL HIGH (ref <5.7)
Mean Plasma Glucose: 134 mg/dL — ABNORMAL HIGH (ref <117)

## 2013-10-29 LAB — HEPATIC FUNCTION PANEL
ALBUMIN: 3.4 g/dL — AB (ref 3.5–5.2)
ALK PHOS: 89 U/L (ref 39–117)
ALT: 9 U/L (ref 0–35)
AST: 10 U/L (ref 0–37)
Bilirubin, Direct: <0.1 mg/dL (ref 0.0–0.3)
TOTAL PROTEIN: 6.5 g/dL (ref 6.0–8.3)
Total Bilirubin: 0.2 mg/dL (ref 0.2–1.2)

## 2013-10-29 LAB — MAGNESIUM: Magnesium: 1.9 mg/dL (ref 1.5–2.5)

## 2013-10-29 LAB — TSH: TSH: 1.04 u[IU]/mL (ref 0.350–4.500)

## 2013-11-02 ENCOUNTER — Encounter: Payer: Self-pay | Admitting: Neurology

## 2013-11-02 ENCOUNTER — Ambulatory Visit (INDEPENDENT_AMBULATORY_CARE_PROVIDER_SITE_OTHER): Payer: Medicare Other | Admitting: Neurology

## 2013-11-02 VITALS — BP 140/70 | HR 94 | Temp 97.9°F | Ht 64.0 in | Wt 124.0 lb

## 2013-11-02 DIAGNOSIS — G2581 Restless legs syndrome: Secondary | ICD-10-CM

## 2013-11-02 MED ORDER — ROPINIROLE HCL 0.25 MG PO TABS: 0.2500 mg | ORAL_TABLET | Freq: Every day | ORAL | Status: DC

## 2013-11-02 NOTE — Patient Instructions (Addendum)
1. Start Requip 0.25mg  once a day and monitor symptoms and side effects as discussed 2. Check ferritin level 3. Follow-up in 4 weeks

## 2013-11-02 NOTE — Progress Notes (Signed)
NEUROLOGY CONSULTATION NOTE  [name] MRN: [id number] DOB: [birth year]  Referring provider: Dr. Lucky Cowboy Primary care provider: Dr. Lucky Cowboy  Reason for consult:  Restless legs  Dear Dr Oneta Rack:  Thank you for your kind referral of [name] for consultation of the above symptoms. Although her history is well known to you, please allow me to reiterate it for the purpose of our medical record. The patient was accompanied to the clinic by her husband who also provides collateral information.   HISTORY OF PRESENT ILLNESS: This is a very pleasant 80 year old right-handed woman with a history of hypertension, hypothyroidism, cardiomyopathy s/p pacemaker placement, peripheral arterial disease, diet-controlled diabetes, spinal stenosis, presenting for evaluation of bilateral uncontrollable leg movements, right more than left.  Symptoms started a year ago, initially at night, keeping her awake, however over the past few months, leg movements are now more noticeable in the daytime.  Once in a while they wake her up from sleep, but she reports symptoms are not as bad at night anymore.  She states she sleeps well.  The movements stop when she holds down her right knee, or when she starts walking around.  She has had bilateral leg pain, and reports the pain is better when walking or propping her legs up.  She has occasional cramps in both calves but reports this is better.  She has tried amitriptyline, Lyrica, Tramadol, gabapentin, codeine, and hydrocodone for the pain, with minimal relief.  Arms are unaffected.  She was in the hospital recently for dehydration, followed by a few weeks at rehab, and tells me that her strength is not yet fully back.  She denies any numbness or tingling in her arms.  She has occasional numbness in both big toes, they feel cold or hot, and she usually uses a heating pad at home.  There is no family history of similar symptoms.  She denies  any frequent headaches, diplopia, dysarthria, dysphagia, neck/back pain, bowel/bladder dysfunction.  She wears a nasal cannula for O2 in the office today, denies any shortness of breath or chest pain.    Records and images were personally reviewed where available.   Laboratory Data: Lab Results  Component Value Date   WBC 7.8 09/24/2013   HGB 9.7* 09/24/2013   HCT 32.0* 09/24/2013   MCV 82.9 09/24/2013   PLT PLATELET CLUMPS NOTED ON SMEAR, COUNT APPEARS ADEQUATE 09/24/2013     Chemistry      Component Value Date/Time   NA 137 10/28/2013 1725   K 4.2 10/28/2013 1725   CL 98 10/28/2013 1725   CO2 27 10/28/2013 1725   BUN 15 10/28/2013 1725   CREATININE 1.06 10/28/2013 1725   CREATININE 1.12* 09/24/2013 0340      Component Value Date/Time   CALCIUM 9.1 10/28/2013 1725   ALKPHOS 89 10/28/2013 1725   AST 10 10/28/2013 1725   ALT 9 10/28/2013 1725   BILITOT 0.2 10/28/2013 1725     Lab Results  Component Value Date   HGBA1C 6.3* 10/28/2013   Lab Results  Component Value Date   TSH 1.040 10/28/2013    PAST MEDICAL HISTORY: Past Medical History  Diagnosis Date  . CONGESTIVE HEART FAILURE 06/03/2007  . COPD 06/03/2007  . HYPERTENSION 06/03/2007  . HYPOTHYROIDISM 06/03/2007  . Hernia   . Asthma   . Diabetes mellitus   . Hyperlipidemia   . Arthritis   . Osteoporosis   . Cardiomyopathy, nonischemic     Irritable  ejection fractions most recently EF 60% 3/14>> EF 30% 10/14  . Biventricular pacemaker -Medtronic]     Downgrade from CRT-D; LV and RV rate sense portions switched with subsequent fracture of morning rate sense portion in the LV port  . 6949-lead     Rate sense no LV port with subsequent fracture; high-voltage inactivated following down rate    PAST SURGICAL HISTORY: Past Surgical History  Procedure Laterality Date  . Cardiac defibrillator placement    . Appendectomy    . Hysterectomy - unknown type    . Posterior fusion clivus-c2 transarticular stealth guided w/ icbg    .  Cystectomy      elbow  . Icd      medtronic maximo  . Polypectomy      bladder  . Esophagogastroduodenoscopy N/A 10/30/2012    Procedure: ESOPHAGOGASTRODUODENOSCOPY (EGD);  Surgeon: Vertell Novak., MD;  Location: [facility];  Service: Endoscopy;  Laterality: N/A;    MEDICATIONS: Current Outpatient Prescriptions on File Prior to Visit  Medication Sig Dispense Refill  . albuterol (PROVENTIL HFA;VENTOLIN HFA) 108 (90 BASE) MCG/ACT inhaler Inhale 1-2 puffs into the lungs every 4 (four) hours as needed for wheezing or shortness of breath.      [name] albuterol (PROVENTIL) (2.5 MG/3ML) 0.083% nebulizer solution Inhale 3 mLs into the lungs every 6 (six) hours as needed for wheezing.  75 mL  12  . amLODipine (NORVASC) 5 MG tablet Take 1 tablet (5 mg total) by mouth daily.  30 tablet  11  . carvedilol (COREG) 6.25 MG tablet Take 1 tablet (6.25 mg total) by mouth 2 (two) times daily.  60 tablet  6  . cholecalciferol (VITAMIN D) 1000 UNITS tablet Take 1,000 Units by mouth daily as needed (for vitamin).      . clonazePAM (KLONOPIN) 0.5 MG tablet Take 0.5 tablets (0.25 mg total) by mouth at bedtime as needed (sleep).  90 tablet  0  . diltiazem (CARDIZEM CD) 240 MG 24 hr capsule       . guaifenesin (ROBITUSSIN) 100 MG/5ML syrup Take 10 mLs (200 mg total) by mouth every 4 (four) hours as needed for congestion.  120 mL  0  . levothyroxine (SYNTHROID, LEVOTHROID) 112 MCG tablet Take 1 tablet (112 mcg total) by mouth daily before breakfast.      . lidocaine (LIDODERM) 5 % Place 1 patch onto the skin daily. Remove & Discard patch within 12 hours or as directed by MD  30 patch  0  . metoCLOPramide (REGLAN) 5 MG tablet Take 5 mg by mouth 3 (three) times daily before meals.      . Multiple Vitamin (MULTIVITAMIN WITH MINERALS) TABS tablet Take 1 tablet by mouth daily.      [name] omeprazole (PRILOSEC) 20 MG capsule Take 20 mg by mouth 2 (two) times daily.      [name] SPIRIVA HANDIHALER 18 MCG inhalation capsule inhale the  contents of one capsule in the handihaler once daily  30 capsule  6  . traMADol (ULTRAM) 50 MG tablet Take 1 tablet (50 mg total) by mouth every 6 (six) hours as needed for moderate pain.  100 tablet  3   No current facility-administered medications on file prior to visit.    ALLERGIES: Allergies  Allergen Reactions  . Ace Inhibitors Nausea And Vomiting  . Acetaminophen-Codeine Nausea And Vomiting  . Aspirin     Stomach upset  . Atorvastatin Nausea And Vomiting    lipitor  . Bisoprolol Fumarate Nausea  And Vomiting    zebeta  . Ezetimibe-Simvastatin Nausea And Vomiting    vytorin  . Fluvastatin Sodium Nausea And Vomiting    lescol  . Gabapentin Nausea And Vomiting    neurontin  . Gemfibrozil Nausea And Vomiting    Lopid   . Ibuprofen     Motrin- advil= stomach upset  . Lisinopril Nausea And Vomiting    Prinivil, zestril  . Oxycodone Hcl Nausea And Vomiting  . Rosuvastatin Nausea And Vomiting    crestor  . Tylenol [Acetaminophen]     Stomach upset    FAMILY HISTORY: Family History  Problem Relation Age of Onset  . Heart disease Mother   . Breast cancer Mother   . Brain cancer Mother   . Cancer Mother   . Diabetes Mother   . Heart attack Mother     SOCIAL HISTORY: History   Social History  . Marital Status: Married    Spouse Name: N/A    Number of Children: N/A  . Years of Education: N/A   Occupational History  . retired Air traffic controllerdeputy clerk    Social History Main Topics  . Smoking status: Former Smoker -- 2.00 packs/day for 30 years    Quit date: 08/19/1996  . Smokeless tobacco: Never Used  . Alcohol Use: No  . Drug Use: No  . Sexual Activity: Not on file   Other Topics Concern  . Not on file   Social History Narrative  . No narrative on file    REVIEW OF SYSTEMS: Constitutional: No fevers, chills, or sweats, no generalized fatigue, change in appetite Eyes: No visual changes, double vision, eye pain Ear, nose and throat: No hearing loss, ear pain,  nasal congestion, sore throat Cardiovascular: No chest pain, palpitations Respiratory:  As above GastrointestinaI: No nausea, vomiting, diarrhea, abdominal pain, fecal incontinence Genitourinary:  No dysuria, urinary retention or frequency Musculoskeletal:  No neck pain, back pain Integumentary: No rash, pruritus, skin lesions Neurological: as above Psychiatric: No depression, insomnia, anxiety Endocrine: No palpitations, fatigue, diaphoresis, mood swings, change in appetite, change in weight, increased thirst Hematologic/Lymphatic:  No anemia, purpura, petechiae. Allergic/Immunologic: no itchy/runny eyes, nasal congestion, recent allergic reactions, rashes  PHYSICAL EXAM: Filed Vitals:   11/02/13 1331  BP: 140/70  Pulse: 94  Temp: 97.9 F (36.6 C)   General: No acute distress, wearing nasal cannula. At the beginning of the visit, she was having uncontrollable irregular movements of both legs, right greater than left.  There seemed to be some distractability, with decreased movements noted during the neurological exam.  She started having them again toward the end of the visit, with note of reduction with walking. Head:  Normocephalic/atraumatic Neck: supple, no paraspinal tenderness, full range of motion Back: No paraspinal tenderness Heart: regular rate and rhythm Lungs: Clear to auscultation bilaterally. Vascular: No carotid bruits. Skin/Extremities: No rash, no edema Neurological Exam: Mental status: alert and oriented to person, place, and time, no dysarthria or aphasia, Fund of knowledge is appropriate.  Recent and remote memory are intact.  Attention and concentration are normal.    Able to name objects and repeat phrases. Cranial nerves: CN I: not tested CN II: pupils equal, round and reactive to light, visual fields intact, fundi unremarkable. CN III, IV, VI:  full range of motion, no nystagmus, no ptosis CN V: facial sensation intact CN VII: upper and lower face  symmetric CN VIII: hearing intact CN IX, X: gag intact, uvula midline CN XI: sternocleidomastoid and trapezius muscles intact CN  XII: tongue midline Bulk & Tone: normal, no fasciculations. Motor: 5/5 throughout with no pronator drift. Sensation: decreased cold over both feet. Decreased vibration up to ankle on left.  Intact to pin, joint position sense.  No extinction to double simultaneous stimulation.  Romberg test negative Deep Tendon Reflexes: +2 throughout except for absent ankle jerks bilaterally, no ankle clonus Plantar responses: downgoing bilaterally Finger to nose testing: no incoordination Gait: slow and cautious, needs cane to ambulate, no ataxia  IMPRESSION: This is a very pleasant 80 year old right-handed woman with a history of hypertension, hypothyroidism, cardiomyopathy s/p pacemaker placement, spinal stenosis, bilateral leg pain, peripheral arterial disease, presenting for evaluation of uncontrollable leg movements that improve when walking.  She is noted to have the leg movements in the office today.  Symptoms suggestive of restless leg syndrome, check ferritin level.  We discussed treatment options, I would do a trial of low dose Requip 0.25mg  daily.  Side effects were discussed, including nausea, dizziness, drowsiness, and confusion, and they know to call the office for any problems.  Dose will be uptitrated as tolerated.  She will follow-up in 4 weeks.  Thank you for allowing me to participate in the care of this patient. Please do not hesitate to call for any questions or concerns.   Patrcia Dolly, M.D.

## 2013-11-05 ENCOUNTER — Ambulatory Visit (INDEPENDENT_AMBULATORY_CARE_PROVIDER_SITE_OTHER): Payer: Medicare Other | Admitting: Internal Medicine

## 2013-11-05 ENCOUNTER — Encounter: Payer: Self-pay | Admitting: Internal Medicine

## 2013-11-05 VITALS — BP 114/62 | HR 102 | Temp 98.4°F | Ht 64.0 in | Wt 126.0 lb

## 2013-11-05 DIAGNOSIS — J449 Chronic obstructive pulmonary disease, unspecified: Secondary | ICD-10-CM

## 2013-11-05 DIAGNOSIS — I10 Essential (primary) hypertension: Secondary | ICD-10-CM

## 2013-11-05 MED ORDER — NEBIVOLOL HCL 10 MG PO TABS: 10.0000 mg | ORAL_TABLET | Freq: Every day | ORAL | Status: DC

## 2013-11-05 MED ORDER — FLUTTER DEVI: Status: AC

## 2013-11-05 MED ORDER — AZITHROMYCIN 250 MG PO TABS: ORAL_TABLET | ORAL | Status: DC

## 2013-11-05 MED ORDER — PREDNISONE 10 MG PO TABS: ORAL_TABLET | ORAL | Status: DC

## 2013-11-05 NOTE — Patient Instructions (Addendum)
Try off coreg (I will let Dr Graciela Husbands know) Bystolic 10 mg one daily  Prednisone 10 mg take  4 each am x 2 days,   2 each am x 2 days,  1 each am x 2 days and stop  zpak For your breathing > Ok to use albuterol up to every 4 hours if you need  For cough>  mucinex dm 600 1-2 every and use the flutter valve and ok to add tramadol if severe   Please schedule a follow up office visit in 2 weeks, sooner if needed to See Dr Delton Coombes or TammyNP or me

## 2013-11-05 NOTE — Progress Notes (Signed)
Subjective:    Patient ID: [name], female    DOB: 11/03/1929    MRN: [id number]  Brief patient profile:  [name] is a 80 year old woman with COPD that is in the mild to moderate range. She is followed by Dr Gala RomneyBensimhon for congestive heart failure secondary to nonischemic cardiomyopathy. Previous EF was 25-30%. She is status post BiV ICD, EF now 60%. She also has diastolic dysfunction and a history of pulmonary edema. Has OSA but unable to tolerate CPAP. Pacer changed    History of Present Illness  ROV 03/08/10 -- returns for her COPD, systolic/diastolic CHF, OSA. Since last visit she had reversal of her colostomy, still doesn't have her strength back. She still has cough. Has been using flutter valve, has been trying to practice swallowing precautions. She coughs whenever she takes her Advair. Seems to tolerate the Spiriva. She has had some increased LE edema, currently seems to be at baseline.   ROV 05/14/10 --  moderate COPD, NICM s/p BiV pacer, OSA (not on CPAP). Last time we discussed her cough, stopped Advair and continued Spiriva. Also started fluticasone nasal spray at bedtime. Her cough is better. Hasn't really missed the Advair, breathing is stable.   ROV 10/05/10 -- regular f/u for COPD, also hx systolic and diastolic CHF s/p BiV pacer, OSA not on CPAP. Just had BiV pacer changed on 09/03/10. She returns telling me that her SOB has slowly progressed over last 6 - 12 months. Not able to walk as far, not able to cook or clean. She has been coughing daily, not always productive. Hears wheeze at night and sometimes during the day, better with coughing up phlegm. Currently on Spiriva, stopped Advair. Not taking fluticasone spray. Hasn't tolerated mucinex before.   ROV 11/05/10 -- COPD, systolic/diastolic CHF, biV pacer, OSA not on CPAP. Last time we added Symbicort to Spiriva. Her breathing has benefitted, SABA use has decreased to 2x a day. ? having some palpitations. Still limited  with exertion. Wakes up at night and uses the SABA, helps her. Still coughing phlegm every day. Fluticasone spray may have helped some.   ROV 02/13/11 -- COPD (moderate), systolic and diastolic CHF + BiV pacer, OSA not on CPAP. She is currently on Spiriva + Symbicort. Using SABA about 2x a day. Only using fluticasone nasal occasionally because it was putting sores in her nose - it did help her drainage and cough.  Taking benadryl every night. She is on omeprazole bid.   ROV 03/19/11 -- hx of COPD,  systolic and diastolic CHF + BiV pacer, OSA not on CPAP. Last time we stopped symbicort and continued Spiriva, repeated PFT's. Since last visit she has continued to have dyspnea. On Sunday she had an episode that was associated with CP. She was started on daliresp mid-July by Dr Oneta RackMcKeown. She believes that it may have helped her, although her cough is worse. We changed the fluticasone nose spray to nasonex, but she couldn't tolerate due to sores in her nose. She did the [place] x 1, but couldn't continue it. Her PFT today show moderate AFL, little change FEV1 from priors.   ROV 02/06/12 -- Hx of COPD,  systolic and diastolic CHF + BiV pacer, OSA not on CPAP. Has been continue on Spiriva. Was on daliresp, she isn't sure when that was stopped, but she is no longer taking.   ROV 08/24/12 -- Hx of COPD,  systolic and diastolic CHF + BiV pacer, OSA not on CPAP. She is followed  by Dr Graciela Husbands for her CHF and pacer - notes indicate that she is no longer BiV paced, is now only LV paced, changed in 9/13. She states that she is having more exertional SOB with low energy for the last 2 months. She can't stand up to cook - says she feels like she is going to pass out if she stands there too long. She is having chest tightness. She continues to take Spiriva. Has been using xopenex prn with some relief.  Lyrica is new, was started in December for diabetic neuropathy. Daliresp is on her med list, but she doesn't believe she is taking -  can't recall what it is  ROV 03/05/13 --  Hx of COPD,  systolic and diastolic CHF + BiV pacer (no longer paced), OSA not on CPAP. Reports today that her breathing is bothering her, nasal congestion. Has cough that "strangles her".  Admitted in March for bowel obstruction.  rec Please continue your Spiriva daily Continue your xopenex as needed for shortness of breath Start nasonex nasal spray, 2 sprays each nostril daily Start loratadine 10mg  daily.    11/05/2013 f/u ov/Camira Geidel re: aecopd Chief Complaint  Patient presents with  . Acute Visit    Pt c/o increased SOB and wheezing x 2 days, worse since this am. She has had increased cough with large amounts of yellow sputum x 2 days. She has been using albuterol inhaler and neb approx twice daily for the past several wks.     At baseline using saba at least  twice daily in addition to spiriva , then gradually worse over last 48 h using both saba hfa and neb a total of 4-6 x per day, better p rx and comfortable at rest but sob with adls  No obvious day to day or daytime variabilty or assoc   cp or chest tightness, subjective wheeze overt sinus or hb symptoms. No unusual exp hx or h/o childhood pna/ asthma or knowledge of premature birth.  Sleeping ok without nocturnal  or early am exacerbation  of respiratory  c/o's or need for noct saba. Also denies any obvious fluctuation of symptoms with weather or environmental changes or other aggravating or alleviating factors except as outlined above   Current Medications, Allergies, Complete Past Medical History, Past Surgical History, Family History, and Social History were reviewed in [employer] record.  ROS  The following are not active complaints unless bolded sore throat, dysphagia, dental problems, itching, sneezing,  nasal congestion or excess/ purulent secretions, ear ache,   fever, chills, sweats, unintended wt loss, pleuritic or exertional cp, hemoptysis,  orthopnea pnd or leg  swelling, presyncope, palpitations, heartburn, abdominal pain, anorexia, nausea, vomiting, diarrhea  or change in bowel or urinary habits, change in stools or urine, dysuria,hematuria,  rash, arthralgias, visual complaints, headache, numbness weakness or ataxia or problems with walking or coordination,  change in mood/affect or memory.          Objective:   Physical Exam  Gen: elderly wf sitting in w/c  ENT: No lesions,  mouth clear,  oropharynx clear, no postnasal drip  Neck: No JVD, no TMG, no carotid bruits  Lungs: No use of accessory muscles, no dullness to percussion,  Trace bilateral exp wheeze  Cardiovascular: RRR, heart sounds normal, no murmur or gallops, no peripheral edema  Musculoskeletal: No deformities, no cyanosis or clubbing  Neuro: alert, non focal  Skin: Warm, no lesions or rashes     Assessment & Plan:

## 2013-11-06 NOTE — Assessment & Plan Note (Signed)
DDX of  difficult airways managment all start with A and  include Adherence, Ace Inhibitors, Acid Reflux, Active Sinus Disease, Alpha 1 Antitripsin deficiency, Anxiety masquerading as Airways dz,  ABPA,  allergy(esp in young), Aspiration (esp in elderly), Adverse effects of DPI,  Active smokers, plus two Bs  = Bronchiectasis and Beta blocker use..and one C= CHF   She is overusing B2 at baseline and it's not really helping much alleviate her apparent aecopd  ? Related to use of coreg but can't tol bisoprolol > try samples of bystolic   Consider laba/ics as breo or neb laba/ics   For now short course prednisone/ zpak and use flutter valve   See instructions for specific recommendations which were reviewed directly with the patient who was given a copy with highlighter outlining the key components.

## 2013-11-06 NOTE — Assessment & Plan Note (Addendum)
Strongly prefer in this setting: Bystolic, the most beta -1  selective Beta blocker available in sample form, with bisoprolol the most selective generic choice  on the market. Has tried bisoprolol which caused nausea so rec trial of samples of bystolic 10 mg daily

## 2013-11-09 ENCOUNTER — Other Ambulatory Visit: Payer: Self-pay | Admitting: Internal Medicine

## 2013-11-17 ENCOUNTER — Ambulatory Visit: Payer: Self-pay | Admitting: Internal Medicine

## 2013-11-25 ENCOUNTER — Encounter: Payer: Self-pay | Admitting: Emergency Medicine

## 2013-11-25 ENCOUNTER — Ambulatory Visit (INDEPENDENT_AMBULATORY_CARE_PROVIDER_SITE_OTHER): Payer: Medicare Other | Admitting: Emergency Medicine

## 2013-11-25 ENCOUNTER — Telehealth: Payer: Self-pay

## 2013-11-25 VITALS — BP 124/68 | HR 77 | Temp 98.4°F | Ht 64.0 in | Wt 125.4 lb

## 2013-11-25 DIAGNOSIS — J449 Chronic obstructive pulmonary disease, unspecified: Secondary | ICD-10-CM

## 2013-11-25 DIAGNOSIS — J4489 Other specified chronic obstructive pulmonary disease: Secondary | ICD-10-CM

## 2013-11-25 DIAGNOSIS — I10 Essential (primary) hypertension: Secondary | ICD-10-CM

## 2013-11-25 MED ORDER — ALBUTEROL SULFATE (2.5 MG/3ML) 0.083% IN NEBU: 3.0000 mL | INHALATION_SOLUTION | RESPIRATORY_TRACT | Status: AC | PRN

## 2013-11-25 NOTE — Telephone Encounter (Signed)
[name] from home health called requesting verbal ok to extend PT for 2 weeks, twice a week. Dr.McKeown gave verbal ok. [name] aware.

## 2013-11-25 NOTE — Progress Notes (Signed)
Subjective:   Patient ID: [name], female    DOB: 11/26/1929, 80 y.o.   MRN: [id number]  HPI [name] is a 80 year old woman with COPD that is in the mild to moderate range. [name] is followed by Dr Gala RomneyBensimhon for congestive heart failure secondary to nonischemic cardiomyopathy. Previous EF was 25-30%. [name] is status post BiV ICD, EF now 60%. [name] also has diastolic dysfunction and a history of pulmonary edema. Has OSA but unable to tolerate CPAP. Pacer changed   ROV 03/08/10 -- returns for her COPD, systolic/diastolic CHF, OSA. Since last visit [name] had reversal of her colostomy, still doesn't have her strength back. [name] still has cough. Has been using flutter valve, has been trying to practice swallowing precautions. [name] coughs whenever [name] takes her Advair. Seems to tolerate the Spiriva. [name] has had some increased LE edema, currently seems to be at baseline.   ROV 05/14/10 --  moderate COPD, NICM s/p BiV pacer, OSA (not on CPAP). Last time we discussed her cough, stopped Advair and continued Spiriva. Also started fluticasone nasal spray at bedtime. Her cough is better. Hasn't really missed the Advair, breathing is stable.   ROV 10/05/10 -- regular f/u for COPD, also hx systolic and diastolic CHF s/p BiV pacer, OSA not on CPAP. Just had BiV pacer changed on 09/03/10. [name] returns telling me that her SOB has slowly progressed over last 6 - 12 months. Not able to walk as far, not able to cook or clean. [name] has been coughing daily, not always productive. Hears wheeze at night and sometimes during the day, better with coughing up phlegm. Currently on Spiriva, stopped Advair. Not taking fluticasone spray. Hasn't tolerated mucinex before.   ROV 11/05/10 -- COPD, systolic/diastolic CHF, biV pacer, OSA not on CPAP. Last time we added Symbicort to Spiriva. Her breathing has benefitted, SABA use has decreased to 2x a day. ? having some palpitations. Still limited with exertion. Wakes up at night and uses the  SABA, helps her. Still coughing phlegm every day. Fluticasone spray may have helped some.   ROV 02/13/11 -- COPD (moderate), systolic and diastolic CHF + BiV pacer, OSA not on CPAP. [name] is currently on Spiriva + Symbicort. Using SABA about 2x a day. Only using fluticasone nasal occasionally because it was putting sores in her nose - it did help her drainage and cough.  Taking benadryl every night. [name] is on omeprazole bid.   ROV 03/19/11 -- hx of COPD,  systolic and diastolic CHF + BiV pacer, OSA not on CPAP. Last time we stopped symbicort and continued Spiriva, repeated PFT's. Since last visit [name] has continued to have dyspnea. On Sunday [name] had an episode that was associated with CP. [name] was started on daliresp mid-July by Dr Oneta RackMcKeown. [name] believes that it may have helped her, although her cough is worse. We changed the fluticasone nose spray to nasonex, but [name] couldn't tolerate due to sores in her nose. [name] did the [place] x 1, but couldn't continue it. Her PFT today show moderate AFL, little change FEV1 from priors.   ROV 02/06/12 -- Hx of COPD,  systolic and diastolic CHF + BiV pacer, OSA not on CPAP. Has been continue on Spiriva. Was on daliresp, [name] isn't sure when that was stopped, but [name] is no longer taking.   ROV 08/24/12 -- Hx of COPD,  systolic and diastolic CHF + BiV pacer, OSA not on CPAP. [name] is followed by Dr Graciela HusbandsKlein for her CHF and pacer -  notes indicate that [name] is no longer BiV paced, is now only LV paced, changed in 9/13. [name] states that [name] is having more exertional SOB with low energy for the last 2 months. [name] can't stand up to cook - says [name] feels like [name] is going to pass out if [name] stands there too long. [name] is having chest tightness. [name] continues to take Spiriva. Has been using xopenex prn with some relief.  Lyrica is new, was started in December for diabetic neuropathy. Daliresp is on her med list, but [name] doesn't believe [name] is taking - can't recall what it is  ROV 03/05/13 --  Hx of  COPD,  systolic and diastolic CHF + BiV pacer (no longer paced), OSA not on CPAP. Reports today that her breathing is bothering her, nasal congestion. Has cough that "strangles her".  Admitted in March for bowel obstruction.   ROV 11/25/13 -- Hx of COPD,  systolic and diastolic CHF + BiV pacer (no longer paced), OSA not on CPAP. [name] was treated by Dr Sherene Sires 3/21 for an acute flare w abx/pred. Also trial bystolic. [name] is currently off b-blocker and diltiazem. Feels much better - back to baseline. [name] is on low dose requip since 11/02/13 but her leg movements are no better.    Objective:   Physical Exam Filed Vitals:   11/25/13 1534  BP: 124/68  Pulse: 77  Temp: 98.4 F (36.9 C)   Gen: Pleasant, well-nourished, in no distress,  normal affect  ENT: No lesions,  mouth clear,  oropharynx clear, no postnasal drip  Neck: No JVD, no TMG, no carotid bruits  Lungs: No use of accessory muscles, no dullness to percussion, clear without rales or rhonchi  Cardiovascular: RRR, heart sounds normal, no murmur or gallops, no peripheral edema  Musculoskeletal: No deformities, no cyanosis or clubbing  Neuro: alert, non focal  Skin: Warm, no lesions or rashes     Assessment & Plan:  COPD Recent AE is improved. [name] took the bystolic but stopped it when samples ran out. Now not on any b-blocker - continue spiriva + albuterol nebs prn - O2 - rov 3  HYPERTENSION [name] is currently off any b-blocker. Suspect cards will want to restart this. [name] seemed to tolerate the samples of bystolic, would choose this if possible.

## 2013-11-25 NOTE — Patient Instructions (Addendum)
Please speak to Dr Karel Jarvis to see if you should increase your Requip dosing Continue your spiriva daily We will refill your albuterol nebulizer. Use this as needed for shortness of breath Continue your oxygen Follow with Dr Delton Coombes in 3 months or sooner if you have any problems.

## 2013-11-25 NOTE — Assessment & Plan Note (Signed)
She is currently off any b-blocker. Suspect cards will want to restart this. She seemed to tolerate the samples of bystolic, would choose this if possible.

## 2013-11-25 NOTE — Assessment & Plan Note (Signed)
Recent AE is improved. She took the bystolic but stopped it when samples ran out. Now not on any b-blocker - continue spiriva + albuterol nebs prn - O2 - rov 3

## 2013-11-29 ENCOUNTER — Inpatient Hospital Stay (HOSPITAL_COMMUNITY)
Admission: EM | Admit: 2013-11-29 | Discharge: 2013-12-02 | DRG: 189 | Disposition: A | Discharge status: Home Health | Payer: Medicare Other | Attending: Internal Medicine | Admitting: Internal Medicine

## 2013-11-29 ENCOUNTER — Encounter (HOSPITAL_COMMUNITY): Payer: Self-pay | Admitting: Emergency Medicine

## 2013-11-29 ENCOUNTER — Emergency Department (HOSPITAL_COMMUNITY): Payer: Medicare Other

## 2013-11-29 DIAGNOSIS — E785 Hyperlipidemia, unspecified: Secondary | ICD-10-CM | POA: Diagnosis present

## 2013-11-29 DIAGNOSIS — Z87891 Personal history of nicotine dependence: Secondary | ICD-10-CM

## 2013-11-29 DIAGNOSIS — I10 Essential (primary) hypertension: Secondary | ICD-10-CM | POA: Diagnosis present

## 2013-11-29 DIAGNOSIS — R109 Unspecified abdominal pain: Secondary | ICD-10-CM

## 2013-11-29 DIAGNOSIS — I2789 Other specified pulmonary heart diseases: Secondary | ICD-10-CM | POA: Diagnosis present

## 2013-11-29 DIAGNOSIS — Z66 Do not resuscitate: Secondary | ICD-10-CM | POA: Diagnosis present

## 2013-11-29 DIAGNOSIS — M4800 Spinal stenosis, site unspecified: Secondary | ICD-10-CM

## 2013-11-29 DIAGNOSIS — J441 Chronic obstructive pulmonary disease with (acute) exacerbation: Secondary | ICD-10-CM

## 2013-11-29 DIAGNOSIS — Z79899 Other long term (current) drug therapy: Secondary | ICD-10-CM

## 2013-11-29 DIAGNOSIS — J45901 Unspecified asthma with (acute) exacerbation: Secondary | ICD-10-CM

## 2013-11-29 DIAGNOSIS — M129 Arthropathy, unspecified: Secondary | ICD-10-CM | POA: Diagnosis present

## 2013-11-29 DIAGNOSIS — I129 Hypertensive chronic kidney disease with stage 1 through stage 4 chronic kidney disease, or unspecified chronic kidney disease: Secondary | ICD-10-CM | POA: Diagnosis present

## 2013-11-29 DIAGNOSIS — D649 Anemia, unspecified: Secondary | ICD-10-CM

## 2013-11-29 DIAGNOSIS — I5022 Chronic systolic (congestive) heart failure: Secondary | ICD-10-CM

## 2013-11-29 DIAGNOSIS — I428 Other cardiomyopathies: Secondary | ICD-10-CM | POA: Diagnosis present

## 2013-11-29 DIAGNOSIS — E039 Hypothyroidism, unspecified: Secondary | ICD-10-CM | POA: Diagnosis present

## 2013-11-29 DIAGNOSIS — I5043 Acute on chronic combined systolic (congestive) and diastolic (congestive) heart failure: Secondary | ICD-10-CM | POA: Diagnosis present

## 2013-11-29 DIAGNOSIS — I4891 Unspecified atrial fibrillation: Secondary | ICD-10-CM | POA: Diagnosis present

## 2013-11-29 DIAGNOSIS — E119 Type 2 diabetes mellitus without complications: Secondary | ICD-10-CM | POA: Diagnosis present

## 2013-11-29 DIAGNOSIS — Z981 Arthrodesis status: Secondary | ICD-10-CM

## 2013-11-29 DIAGNOSIS — J9620 Acute and chronic respiratory failure, unspecified whether with hypoxia or hypercapnia: Principal | ICD-10-CM | POA: Diagnosis present

## 2013-11-29 DIAGNOSIS — E1129 Type 2 diabetes mellitus with other diabetic kidney complication: Secondary | ICD-10-CM | POA: Diagnosis present

## 2013-11-29 DIAGNOSIS — E43 Unspecified severe protein-calorie malnutrition: Secondary | ICD-10-CM

## 2013-11-29 DIAGNOSIS — R0609 Other forms of dyspnea: Secondary | ICD-10-CM

## 2013-11-29 DIAGNOSIS — Z950 Presence of cardiac pacemaker: Secondary | ICD-10-CM | POA: Diagnosis present

## 2013-11-29 DIAGNOSIS — I509 Heart failure, unspecified: Secondary | ICD-10-CM | POA: Diagnosis present

## 2013-11-29 DIAGNOSIS — T82190A Other mechanical complication of cardiac electrode, initial encounter: Secondary | ICD-10-CM

## 2013-11-29 DIAGNOSIS — I70219 Atherosclerosis of native arteries of extremities with intermittent claudication, unspecified extremity: Secondary | ICD-10-CM

## 2013-11-29 DIAGNOSIS — G2581 Restless legs syndrome: Secondary | ICD-10-CM

## 2013-11-29 DIAGNOSIS — N182 Chronic kidney disease, stage 2 (mild): Secondary | ICD-10-CM | POA: Diagnosis present

## 2013-11-29 DIAGNOSIS — Z888 Allergy status to other drugs, medicaments and biological substances status: Secondary | ICD-10-CM

## 2013-11-29 DIAGNOSIS — J4489 Other specified chronic obstructive pulmonary disease: Secondary | ICD-10-CM | POA: Diagnosis present

## 2013-11-29 DIAGNOSIS — M810 Age-related osteoporosis without current pathological fracture: Secondary | ICD-10-CM | POA: Diagnosis present

## 2013-11-29 DIAGNOSIS — J449 Chronic obstructive pulmonary disease, unspecified: Secondary | ICD-10-CM

## 2013-11-29 DIAGNOSIS — E538 Deficiency of other specified B group vitamins: Secondary | ICD-10-CM | POA: Diagnosis present

## 2013-11-29 DIAGNOSIS — D638 Anemia in other chronic diseases classified elsewhere: Secondary | ICD-10-CM | POA: Diagnosis present

## 2013-11-29 DIAGNOSIS — J9600 Acute respiratory failure, unspecified whether with hypoxia or hypercapnia: Secondary | ICD-10-CM | POA: Diagnosis present

## 2013-11-29 LAB — CBC
HCT: 31.1 % — ABNORMAL LOW (ref 36.0–46.0)
Hemoglobin: 9.5 g/dL — ABNORMAL LOW (ref 12.0–15.0)
MCH: 25.7 pg — AB (ref 26.0–34.0)
MCHC: 30.5 g/dL (ref 30.0–36.0)
MCV: 84.3 fL (ref 78.0–100.0)
Platelets: 296 10*3/uL (ref 150–400)
RBC: 3.69 MIL/uL — ABNORMAL LOW (ref 3.87–5.11)
RDW: 16.4 % — AB (ref 11.5–15.5)
WBC: 8.3 10*3/uL (ref 4.0–10.5)

## 2013-11-29 LAB — BASIC METABOLIC PANEL
BUN: 13 mg/dL (ref 6–23)
CO2: 27 mEq/L (ref 19–32)
Calcium: 9.7 mg/dL (ref 8.4–10.5)
Chloride: 98 mEq/L (ref 96–112)
Creatinine, Ser: 1.12 mg/dL — ABNORMAL HIGH (ref 0.50–1.10)
GFR calc non Af Amer: 44 mL/min — ABNORMAL LOW (ref >90)
GFR, EST AFRICAN AMERICAN: 52 mL/min — AB (ref >90)
GLUCOSE: 131 mg/dL — AB (ref 70–99)
POTASSIUM: 3.9 meq/L (ref 3.7–5.3)
Sodium: 140 mEq/L (ref 137–147)

## 2013-11-29 LAB — CBC WITH DIFFERENTIAL/PLATELET
BASOS PCT: 0 % (ref 0–1)
Basophils Absolute: 0 10*3/uL (ref 0.0–0.1)
Eosinophils Absolute: 0.5 10*3/uL (ref 0.0–0.7)
Eosinophils Relative: 5 % (ref 0–5)
HCT: 32.9 % — ABNORMAL LOW (ref 36.0–46.0)
HEMOGLOBIN: 10.1 g/dL — AB (ref 12.0–15.0)
LYMPHS ABS: 1.9 10*3/uL (ref 0.7–4.0)
Lymphocytes Relative: 19 % (ref 12–46)
MCH: 26 pg (ref 26.0–34.0)
MCHC: 30.7 g/dL (ref 30.0–36.0)
MCV: 84.8 fL (ref 78.0–100.0)
MONO ABS: 0.7 10*3/uL (ref 0.1–1.0)
MONOS PCT: 7 % (ref 3–12)
Neutro Abs: 6.9 10*3/uL (ref 1.7–7.7)
Neutrophils Relative %: 69 % (ref 43–77)
Platelets: 331 10*3/uL (ref 150–400)
RBC: 3.88 MIL/uL (ref 3.87–5.11)
RDW: 16.3 % — ABNORMAL HIGH (ref 11.5–15.5)
WBC: 10.1 10*3/uL (ref 4.0–10.5)

## 2013-11-29 LAB — TROPONIN I: Troponin I: <0.3 ng/mL (ref <0.30)

## 2013-11-29 LAB — PRO B NATRIURETIC PEPTIDE: Pro B Natriuretic peptide (BNP): 3956 pg/mL — ABNORMAL HIGH (ref 0–450)

## 2013-11-29 LAB — CREATININE, SERUM
CREATININE: 1.02 mg/dL (ref 0.50–1.10)
GFR calc Af Amer: 58 mL/min — ABNORMAL LOW (ref >90)
GFR, EST NON AFRICAN AMERICAN: 50 mL/min — AB (ref >90)

## 2013-11-29 MED ORDER — CLONAZEPAM 0.5 MG PO TABS
0.2500 mg | ORAL_TABLET | Freq: Every evening | ORAL | Status: DC | PRN
  Administered 2013-11-30 – 2013-12-01 (×2): 0.25 mg via ORAL
  Filled 2013-11-29 (×3): qty 1

## 2013-11-29 MED ORDER — FUROSEMIDE 10 MG/ML IJ SOLN
20.0000 mg | Freq: Once | INTRAMUSCULAR | Status: AC
  Administered 2013-11-29: 20 mg via INTRAVENOUS
  Filled 2013-11-29: qty 2

## 2013-11-29 MED ORDER — BUDESONIDE 0.25 MG/2ML IN SUSP
0.2500 mg | Freq: Two times a day (BID) | RESPIRATORY_TRACT | Status: DC
  Administered 2013-11-29 – 2013-12-02 (×6): 0.25 mg via RESPIRATORY_TRACT
  Filled 2013-11-29 (×8): qty 2

## 2013-11-29 MED ORDER — LEVOTHYROXINE SODIUM 112 MCG PO TABS
112.0000 ug | ORAL_TABLET | Freq: Every day | ORAL | Status: DC
  Administered 2013-11-30 – 2013-12-02 (×3): 112 ug via ORAL
  Filled 2013-11-29 (×4): qty 1

## 2013-11-29 MED ORDER — FUROSEMIDE 10 MG/ML IJ SOLN: 20.0000 mg | Freq: Once | INTRAMUSCULAR | Status: DC

## 2013-11-29 MED ORDER — METHYLPREDNISOLONE SODIUM SUCC 125 MG IJ SOLR: 125.0000 mg | Freq: Once | INTRAMUSCULAR | Status: DC

## 2013-11-29 MED ORDER — SODIUM CHLORIDE 0.9 % IJ SOLN
3.0000 mL | Freq: Two times a day (BID) | INTRAMUSCULAR | Status: DC
  Administered 2013-11-29 – 2013-12-02 (×7): 3 mL via INTRAVENOUS

## 2013-11-29 MED ORDER — ROPINIROLE HCL 0.25 MG PO TABS
0.2500 mg | ORAL_TABLET | Freq: Every day | ORAL | Status: DC
  Administered 2013-11-29 – 2013-12-02 (×4): 0.25 mg via ORAL
  Filled 2013-11-29 (×5): qty 1

## 2013-11-29 MED ORDER — ADULT MULTIVITAMIN W/MINERALS CH
1.0000 | ORAL_TABLET | Freq: Every day | ORAL | Status: DC
  Administered 2013-11-29 – 2013-12-02 (×4): 1 via ORAL
  Filled 2013-11-29 (×4): qty 1

## 2013-11-29 MED ORDER — ALPRAZOLAM 0.5 MG PO TABS
0.5000 mg | ORAL_TABLET | Freq: Three times a day (TID) | ORAL | Status: DC | PRN
  Administered 2013-11-29: 0.5 mg via ORAL
  Filled 2013-11-29: qty 1

## 2013-11-29 MED ORDER — METOCLOPRAMIDE HCL 5 MG PO TABS
5.0000 mg | ORAL_TABLET | Freq: Three times a day (TID) | ORAL | Status: DC
  Administered 2013-11-29 – 2013-12-02 (×10): 5 mg via ORAL
  Filled 2013-11-29 (×13): qty 1

## 2013-11-29 MED ORDER — FUROSEMIDE 10 MG/ML IJ SOLN
20.0000 mg | Freq: Once | INTRAMUSCULAR | Status: AC
  Administered 2013-11-30: 20 mg via INTRAVENOUS

## 2013-11-29 MED ORDER — NEBIVOLOL HCL 10 MG PO TABS
10.0000 mg | ORAL_TABLET | Freq: Every day | ORAL | Status: DC
  Administered 2013-11-29 – 2013-12-02 (×4): 10 mg via ORAL
  Filled 2013-11-29 (×4): qty 1

## 2013-11-29 MED ORDER — POLYETHYLENE GLYCOL 3350 17 G PO PACK
17.0000 g | PACK | Freq: Every day | ORAL | Status: DC | PRN
  Filled 2013-11-29: qty 1

## 2013-11-29 MED ORDER — ONDANSETRON HCL 4 MG PO TABS: 4.0000 mg | ORAL_TABLET | Freq: Four times a day (QID) | ORAL | Status: DC | PRN

## 2013-11-29 MED ORDER — ALBUTEROL (5 MG/ML) CONTINUOUS INHALATION SOLN
10.0000 mg/h | INHALATION_SOLUTION | Freq: Once | RESPIRATORY_TRACT | Status: AC
  Administered 2013-11-29: 10 mg/h via RESPIRATORY_TRACT
  Filled 2013-11-29: qty 20

## 2013-11-29 MED ORDER — LIDOCAINE 5 % EX PTCH
1.0000 | MEDICATED_PATCH | CUTANEOUS | Status: DC
Start: 2013-11-29 — End: 2013-12-02
  Administered 2013-11-29 – 2013-12-02 (×4): 1 via TRANSDERMAL
  Filled 2013-11-29 (×4): qty 1

## 2013-11-29 MED ORDER — LEVOFLOXACIN 750 MG PO TABS
750.0000 mg | ORAL_TABLET | ORAL | Status: DC
  Administered 2013-11-29 – 2013-12-01 (×2): 750 mg via ORAL
  Filled 2013-11-29 (×3): qty 1

## 2013-11-29 MED ORDER — ONDANSETRON HCL 4 MG/2ML IJ SOLN: 4.0000 mg | Freq: Four times a day (QID) | INTRAMUSCULAR | Status: DC | PRN

## 2013-11-29 MED ORDER — AMLODIPINE BESYLATE 5 MG PO TABS
5.0000 mg | ORAL_TABLET | Freq: Every day | ORAL | Status: DC
  Administered 2013-11-29 – 2013-12-02 (×4): 5 mg via ORAL
  Filled 2013-11-29 (×4): qty 1

## 2013-11-29 MED ORDER — PANTOPRAZOLE SODIUM 40 MG PO TBEC
40.0000 mg | DELAYED_RELEASE_TABLET | Freq: Every day | ORAL | Status: DC
  Administered 2013-11-29 – 2013-12-02 (×4): 40 mg via ORAL
  Filled 2013-11-29 (×4): qty 1

## 2013-11-29 MED ORDER — TRAMADOL HCL 50 MG PO TABS
50.0000 mg | ORAL_TABLET | Freq: Four times a day (QID) | ORAL | Status: DC | PRN
  Administered 2013-11-29: 50 mg via ORAL
  Filled 2013-11-29 (×2): qty 1

## 2013-11-29 MED ORDER — GUAIFENESIN-DM 100-10 MG/5ML PO SYRP: 5.0000 mL | ORAL_SOLUTION | ORAL | Status: DC | PRN

## 2013-11-29 MED ORDER — TIOTROPIUM BROMIDE MONOHYDRATE 18 MCG IN CAPS
18.0000 ug | ORAL_CAPSULE | Freq: Every day | RESPIRATORY_TRACT | Status: DC
  Administered 2013-11-29 – 2013-12-02 (×4): 18 ug via RESPIRATORY_TRACT
  Filled 2013-11-29: qty 5

## 2013-11-29 MED ORDER — HEPARIN SODIUM (PORCINE) 5000 UNIT/ML IJ SOLN
5000.0000 [IU] | Freq: Three times a day (TID) | INTRAMUSCULAR | Status: DC
  Administered 2013-11-29 – 2013-12-02 (×10): 5000 [IU] via SUBCUTANEOUS
  Filled 2013-11-29 (×10): qty 1

## 2013-11-29 MED ORDER — ALBUTEROL SULFATE (2.5 MG/3ML) 0.083% IN NEBU
3.0000 mL | INHALATION_SOLUTION | RESPIRATORY_TRACT | Status: DC | PRN
  Administered 2013-11-29: 3 mL via RESPIRATORY_TRACT
  Filled 2013-11-29: qty 3

## 2013-11-29 MED ORDER — BOOST / RESOURCE BREEZE PO LIQD
1.0000 | Freq: Two times a day (BID) | ORAL | Status: DC
  Administered 2013-11-29 – 2013-12-02 (×7): 1 via ORAL

## 2013-11-29 MED ORDER — HYDROCODONE-ACETAMINOPHEN 5-325 MG PO TABS: 1.0000 | ORAL_TABLET | ORAL | Status: DC | PRN

## 2013-11-29 MED ORDER — DILTIAZEM HCL ER COATED BEADS 240 MG PO CP24
240.0000 mg | ORAL_CAPSULE | Freq: Every day | ORAL | Status: DC
  Administered 2013-11-29 – 2013-12-02 (×4): 240 mg via ORAL
  Filled 2013-11-29 (×4): qty 1

## 2013-11-29 MED ORDER — METHYLPREDNISOLONE SODIUM SUCC 125 MG IJ SOLR
60.0000 mg | Freq: Three times a day (TID) | INTRAMUSCULAR | Status: DC
  Administered 2013-11-29 – 2013-12-02 (×10): 60 mg via INTRAVENOUS
  Filled 2013-11-29 (×13): qty 0.96

## 2013-11-29 NOTE — ED Notes (Signed)
Xray & lab at [facility].

## 2013-11-29 NOTE — ED Provider Notes (Signed)
CSN: [id number]     Arrival date & time 11/29/13  0520 History   First MD Initiated Contact with Patient 11/29/13 0537     Chief Complaint  Patient presents with  . Shortness of Breath  . Cough  . Wheezing  . Headache     (Consider location/radiation/quality/duration/timing/severity/associated sxs/prior Treatment) HPI D80-year-old female presents to emergency apartment from home via EMS with complaint of worsening shortness of breath.  Symptoms ongoing for last 2-3 days.  Patient has history of COPD.  She's been using her home medications without improvement.  She reports her sputum has gotten thicker and harder to bring up.  No fevers no chills.  Patient also with history of congestive heart failure, hypertension, hypothyroidism, diabetes, hypertension.  She denies any chest pain.  Patient reports her breathing got bad night before last, but improved with a breathing treatment.  Tonight, despite home nebulized treatments.  She was not improving.  He last gave albuterol and Atrovent, Solu-Medrol, and 2 g of magnesium.  Patient reports she is still short of breath, but is feeling somewhat better.  Past Medical History  Diagnosis Date  . CONGESTIVE HEART FAILURE 06/03/2007  . COPD 06/03/2007  . HYPERTENSION 06/03/2007  . HYPOTHYROIDISM 06/03/2007  . Hernia   . Asthma   . Diabetes mellitus   . Hyperlipidemia   . Arthritis   . Osteoporosis   . Cardiomyopathy, nonischemic     Irritable ejection fractions most recently EF 60% 3/14>> EF 30% 10/14  . Biventricular pacemaker -Medtronic]     Downgrade from CRT-D; LV and RV rate sense portions switched with subsequent fracture of morning rate sense portion in the LV port  . 6949-lead     Rate sense no LV port with subsequent fracture; high-voltage inactivated following down rate   Past Surgical History  Procedure Laterality Date  . Cardiac defibrillator placement    . Appendectomy    . Hysterectomy - unknown type    . Posterior fusion  clivus-c2 transarticular stealth guided w/ icbg    . Cystectomy      elbow  . Icd      medtronic maximo  . Polypectomy      bladder  . Esophagogastroduodenoscopy N/A 10/30/2012    Procedure: ESOPHAGOGASTRODUODENOSCOPY (EGD);  Surgeon: Vertell Novak., MD;  Location: [facility];  Service: Endoscopy;  Laterality: N/A;   Family History  Problem Relation Age of Onset  . Heart disease Mother   . Breast cancer Mother   . Brain cancer Mother   . Cancer Mother   . Diabetes Mother   . Heart attack Mother    History  Substance Use Topics  . Smoking status: Former Smoker -- 2.00 packs/day for 30 years    Quit date: 08/19/1996  . Smokeless tobacco: Never Used  . Alcohol Use: No   OB History   Grav Para Term Preterm Abortions TAB SAB Ect Mult Living                 Review of Systems  See History of Present Illness; otherwise all other systems are reviewed and negative   Allergies  Ace inhibitors; Acetaminophen-codeine; Aspirin; Atorvastatin; Bisoprolol fumarate; Ezetimibe-simvastatin; Fluvastatin sodium; Gabapentin; Gemfibrozil; Ibuprofen; Lisinopril; Oxycodone hcl; Rosuvastatin; and Tylenol  Home Medications   Current Outpatient Rx  Name  Route  Sig  Dispense  Refill  . albuterol (PROVENTIL) (2.5 MG/3ML) 0.083% nebulizer solution   Inhalation   Inhale 3 mLs into the lungs every 4 (four) hours as  needed for wheezing or shortness of breath.   240 mL   11   . amLODipine (NORVASC) 5 MG tablet   Oral   Take 1 tablet (5 mg total) by mouth daily.   30 tablet   11     Dose change   . clonazePAM (KLONOPIN) 0.5 MG tablet   Oral   Take 0.5 tablets (0.25 mg total) by mouth at bedtime as needed (sleep).   90 tablet   0   . diltiazem (CARDIZEM CD) 240 MG 24 hr capsule   Oral   Take 240 mg by mouth daily.          [name]. levothyroxine (SYNTHROID, LEVOTHROID) 112 MCG tablet   Oral   Take 1 tablet (112 mcg total) by mouth daily before breakfast.         . lidocaine  (LIDODERM) 5 %   Transdermal   Place 1 patch onto the skin daily. Remove & Discard patch within 12 hours or as directed by MD   30 patch   0   . metoCLOPramide (REGLAN) 5 MG tablet   Oral   Take 5 mg by mouth 3 (three) times daily before meals.         . Multiple Vitamin (MULTIVITAMIN WITH MINERALS) TABS tablet   Oral   Take 1 tablet by mouth daily.         . nebivolol (BYSTOLIC) 10 MG tablet   Oral   Take 1 tablet (10 mg total) by mouth daily.         [name]. omeprazole (PRILOSEC) 20 MG capsule   Oral   Take 20 mg by mouth 2 (two) times daily.         [name]. rOPINIRole (REQUIP) 0.25 MG tablet   Oral   Take 1 tablet (0.25 mg total) by mouth daily.   30 tablet   3   . tiotropium (SPIRIVA) 18 MCG inhalation capsule   Inhalation   Place 18 mcg into inhaler and inhale daily.         . traMADol (ULTRAM) 50 MG tablet   Oral   Take 1 tablet (50 mg total) by mouth every 6 (six) hours as needed for moderate pain.   100 tablet   3   . FREESTYLE LITE test strip      CHECK GLUCOSE ONCE A DAY FOR DIABETES   100 each   PRN   . Respiratory Therapy Supplies (FLUTTER) DEVI      Use as directed   1 each   0    BP 105/51  Pulse 83  Temp(Src) 97.7 F (36.5 C) (Tympanic)  Resp 16  SpO2 99% Physical Exam  Nursing note and vitals reviewed. Constitutional: She is oriented to person, place, and time. She appears well-developed and well-nourished. She appears distressed.  Elderly frail female, with moderate respiratory distress  HENT:  Head: Normocephalic and atraumatic.  Nose: Nose normal.  Mouth/Throat: Oropharynx is clear and moist.  Eyes: Conjunctivae and EOM are normal. Pupils are equal, round, and reactive to light.  Neck: Normal range of motion. Neck supple. No JVD present. No tracheal deviation present. No thyromegaly present.  Cardiovascular: Normal rate, regular rhythm, normal heart sounds and intact distal pulses.  Exam reveals no gallop and no friction rub.   No  murmur heard. Pulmonary/Chest: No stridor. She is in respiratory distress. She has wheezes. She has no rales. She exhibits no tenderness.  Abdominal: Soft. Bowel sounds are normal. She exhibits no distension  and no mass. There is no tenderness. There is no rebound and no guarding.  Musculoskeletal: Normal range of motion. She exhibits no edema and no tenderness.  Lymphadenopathy:    She has no cervical adenopathy.  Neurological: She is alert and oriented to person, place, and time. She exhibits normal muscle tone. Coordination normal.  Skin: Skin is warm and dry. No rash noted. No erythema. No pallor.  Psychiatric: She has a normal mood and affect. Her behavior is normal. Judgment and thought content normal.    ED Course  Procedures (including critical care time) Labs Review Labs Reviewed  CBC WITH DIFFERENTIAL - Abnormal; Notable for the following:    Hemoglobin 10.1 (*)    HCT 32.9 (*)    RDW 16.3 (*)    All other components within normal limits  BASIC METABOLIC PANEL   Imaging Review Dg Chest Portable 1 View  11/29/2013   CLINICAL DATA:  SHORTNESS OF BREATH COUGH WHEEZING HEADACHE  EXAM: PORTABLE CHEST - 1 VIEW  COMPARISON:  Chest radiograph September 21, 2013  FINDINGS: Cardiac silhouette is unremarkable and unchanged. Mildly calcified aortic knob. Similar slightly increased lung volumes with mild chronic interstitial changes, no pleural effusions or focal consolidations. No pneumothorax.  Biventricular cardiac defibrillator in left chest. Multiple EKG lines overlie the patient and may obscure subtle underlying pathology. Patient is rotated the left. Soft tissue planes and included osseous structures are nonsuspicious.  IMPRESSION: COPD without superimposed acute cardiopulmonary process.   Electronically Signed   By: Awilda Metro   On: 11/29/2013 05:50     EKG Interpretation   Date/Time:  Monday November 29 2013 05:30:02 EDT Ventricular Rate:  80 PR Interval:  88 QRS Duration:  149 QT Interval:  455 QTC Calculation: 525 R Axis:   -57 Text Interpretation:  ATRIAL PACED RHYTHM Short PR interval Nonspecific  IVCD with LAD LVH with secondary repolarization abnormality Inferior  infarct, old Probable anterior infarct, age indeterminate No significant  change since 10/27/12 Confirmed by Manraj Yeo  MD, Euphemia Lingerfelt ([id number]) on 11/29/2013  5:40:00 AM      MDM   Final diagnoses:  COPD exacerbation    80 year old female with COPD exacerbation.  Patient is somewhat improved after nebulized treatments, expect she will need admission, especially given her dyspnea.  Will discuss with hospitalist for admission.  No signs of pneumonia, no signs of cardiac ischemia noted.  6:32 AM Pt with persistent tachypnea, wheezing.  Starting continuous neb.  Olivia Mackie, MD 11/29/13 [phone]

## 2013-11-29 NOTE — Progress Notes (Signed)
Pt A/O.  Gave pt prn Xanax and Zofran as ordered.  Pt short of breath at rest and she is restless.

## 2013-11-29 NOTE — ED Notes (Signed)
H/o COPD, here by EMS from home, here for sob, productive cough, wheezing and HA, (denies: CP or nausea at this time). L hand NSL in place, EMS gave albuterol and atrovent 10/0.5, solumedrol 125mg , Magnesium 2 gms, zofran 4mg . Arrives on neb, alert, NAD, calm, interactive, "feeling better". Wheezing and coughing on arrival.

## 2013-11-29 NOTE — Progress Notes (Signed)
I co-sign Leslie Kellam SN notes and assessments 

## 2013-11-29 NOTE — ED Notes (Signed)
Neb continues, increased wob, no distress.

## 2013-11-29 NOTE — H&P (Signed)
Patient Demographics  [name], is a 80 y.o. female  MRN: [id number]   DOB - [birth year]  Admit Date - 11/29/2013  Outpatient Primary MD for the patient is Nadean Corwin, MD  Pulmonologist Dr. Corliss Blacker, cardiologist Dr. Graciela Husbands   With History of -  Past Medical History  Diagnosis Date  . CONGESTIVE HEART FAILURE 06/03/2007  . COPD 06/03/2007  . HYPERTENSION 06/03/2007  . HYPOTHYROIDISM 06/03/2007  . Hernia   . Asthma   . Diabetes mellitus   . Hyperlipidemia   . Arthritis   . Osteoporosis   . Cardiomyopathy, nonischemic     Irritable ejection fractions most recently EF 60% 3/14>> EF 30% 10/14  . Biventricular pacemaker -Medtronic]     Downgrade from CRT-D; LV and RV rate sense portions switched with subsequent fracture of morning rate sense portion in the LV port  . 6949-lead     Rate sense no LV port with subsequent fracture; high-voltage inactivated following down rate      Past Surgical History  Procedure Laterality Date  . Cardiac defibrillator placement    . Appendectomy    . Hysterectomy - unknown type    . Posterior fusion clivus-c2 transarticular stealth guided w/ icbg    . Cystectomy      elbow  . Icd      medtronic maximo  . Polypectomy      bladder  . Esophagogastroduodenoscopy N/A 10/30/2012    Procedure: ESOPHAGOGASTRODUODENOSCOPY (EGD);  Surgeon: Vertell Novak., MD;  Location: [facility];  Service: Endoscopy;  Laterality: N/A;    in for   Chief Complaint  Patient presents with  . Shortness of Breath  . Cough  . Wheezing  . Headache     HPI  [name]  is a 80 y.o. female, with history of combined chronic systolic and diastolic heart failure EF 30% patient has AICD, COPD, hypertension, hypothyroidism, dyslipidemia, diabetes mellitus type 2 on diet  control who was in her usual state of health until about 3 days ago when she started developing a productive cough and subsequently developed progressive shortness of breath and wheezing, symptoms continued to get worse she presented to the ER where her workup was consistent with acute respiratory failure secondary to COPD exacerbation and I was called to admit the patient.   Patient denies any fever chills, no chest pain or palpitations, no chest pressure, no orthopnea, some subjective weight gain, denies any abdominal pain diarrhea, no focal weakness.    Review of Systems    In addition to the HPI above,   No Fever-chills, No Headache, No changes with Vision or hearing, No problems swallowing food or Liquids, No Chest pain, as it is productive Cough  with Shortness of Breath, No Abdominal pain, No Nausea or Vommitting, Bowel movements are regular, No Blood in stool or Urine, No dysuria, No new skin rashes or bruises, No new joints pains-aches,  No new weakness, tingling, numbness in  any extremity, No recent weight loss, No polyuria, polydypsia or polyphagia, No significant Mental Stressors.  A full 10 point Review of Systems was done, except as stated above, all other Review of Systems were negative.   Social History History  Substance Use Topics  . Smoking status: Former Smoker -- 2.00 packs/day for 30 years    Quit date: 08/19/1996  . Smokeless tobacco: Never Used  . Alcohol Use: No      Family History Family History  Problem Relation Age of Onset  . Heart disease Mother   . Breast cancer Mother   . Brain cancer Mother   . Cancer Mother   . Diabetes Mother   . Heart attack Mother       Prior to Admission medications   Medication Sig Start Date End Date Taking? Authorizing Provider  albuterol (PROVENTIL) (2.5 MG/3ML) 0.083% nebulizer solution Inhale 3 mLs into the lungs every 4 (four) hours as needed for wheezing or shortness of breath. 11/25/13  Yes Leslye Peer, MD  amLODipine (NORVASC) 5 MG tablet Take 1 tablet (5 mg total) by mouth daily. 08/30/13  Yes Duke Salvia, MD  clonazePAM (KLONOPIN) 0.5 MG tablet Take 0.5 tablets (0.25 mg total) by mouth at bedtime as needed (sleep). 10/28/13  Yes Lucky Cowboy, MD  diltiazem (CARDIZEM CD) 240 MG 24 hr capsule Take 240 mg by mouth daily.  08/27/13  Yes Historical Provider, MD  levothyroxine (SYNTHROID, LEVOTHROID) 112 MCG tablet Take 1 tablet (112 mcg total) by mouth daily before breakfast. 09/24/13  Yes Jessica U Vann, DO  lidocaine (LIDODERM) 5 % Place 1 patch onto the skin daily. Remove & Discard patch within 12 hours or as directed by MD 09/24/13  Yes Joseph Art, DO  metoCLOPramide (REGLAN) 5 MG tablet Take 5 mg by mouth 3 (three) times daily before meals.   Yes Historical Provider, MD  Multiple Vitamin (MULTIVITAMIN WITH MINERALS) TABS tablet Take 1 tablet by mouth daily. 09/24/13  Yes Joseph Art, DO  nebivolol (BYSTOLIC) 10 MG tablet Take 1 tablet (10 mg total) by mouth daily. 11/05/13  Yes Nyoka Cowden, MD  omeprazole (PRILOSEC) 20 MG capsule Take 20 mg by mouth 2 (two) times daily.   Yes Historical Provider, MD  rOPINIRole (REQUIP) 0.25 MG tablet Take 1 tablet (0.25 mg total) by mouth daily. 11/02/13  Yes Van Clines, MD  tiotropium (SPIRIVA) 18 MCG inhalation capsule Place 18 mcg into inhaler and inhale daily.   Yes Historical Provider, MD  traMADol (ULTRAM) 50 MG tablet Take 1 tablet (50 mg total) by mouth every 6 (six) hours as needed for moderate pain. 10/26/13  Yes Lucky Cowboy, MD  FREESTYLE LITE test strip CHECK GLUCOSE ONCE A DAY FOR DIABETES 11/09/13   Quentin Mulling, PA-C  Respiratory Therapy Supplies (FLUTTER) DEVI Use as directed 11/05/13   Nyoka Cowden, MD    Allergies  Allergen Reactions  . Ace Inhibitors Nausea And Vomiting  . Acetaminophen-Codeine Nausea And Vomiting  . Aspirin     Stomach upset  . Atorvastatin Nausea And Vomiting    lipitor  . Bisoprolol Fumarate  Nausea And Vomiting    zebeta  . Ezetimibe-Simvastatin Nausea And Vomiting    vytorin  . Fluvastatin Sodium Nausea And Vomiting    lescol  . Gabapentin Nausea And Vomiting    neurontin  . Gemfibrozil Nausea And Vomiting    Lopid   . Ibuprofen     Motrin- advil= stomach upset  .  Lisinopril Nausea And Vomiting    Prinivil, zestril  . Oxycodone Hcl Nausea And Vomiting  . Rosuvastatin Nausea And Vomiting    crestor  . Tylenol [Acetaminophen]     Stomach upset    Physical Exam  Vitals  Blood pressure 149/64, pulse 96, temperature 99.1 F (37.3 C), temperature source Oral, resp. rate 20, height 5\' 4"  (1.626 m), weight 55.339 kg (122 lb), SpO2 96.00%.   1. General frail elderly white female lying in bed in shortness of breath  2. Normal affect and insight, Not Suicidal or Homicidal, Awake Alert, Oriented X 3.  3. No F.N deficits, ALL C.Nerves Intact, Strength 5/5 all 4 extremities, Sensation intact all 4 extremities, Plantars down going.  4. Ears and Eyes appear Normal, Conjunctivae clear, PERRLA. Moist Oral Mucosa.  5. Supple Neck, No JVD, No cervical lymphadenopathy appriciated, No Carotid Bruits.  6. Symmetrical Chest wall movement, Good air movement bilaterally, diffuse wheezing but no rales  7. RRR, No Gallops, Rubs or Murmurs, No Parasternal Heave.  8. Positive Bowel Sounds, Abdomen Soft, Non tender, No organomegaly appriciated,No rebound -guarding or rigidity.  9.  No Cyanosis, Normal Skin Turgor, No Skin Rash or Bruise.  10. Good muscle tone,  joints appear normal , no effusions, Normal ROM.  11. No Palpable Lymph Nodes in Neck or Axillae    Data Review  CBC  Recent Labs Lab 11/29/13 0539  WBC 10.1  HGB 10.1*  HCT 32.9*  PLT 331  MCV 84.8  MCH 26.0  MCHC 30.7  RDW 16.3*  LYMPHSABS 1.9  MONOABS 0.7  EOSABS 0.5  BASOSABS 0.0    ------------------------------------------------------------------------------------------------------------------  Chemistries   Recent Labs Lab 11/29/13 0539  NA 140  K 3.9  CL 98  CO2 27  GLUCOSE 131*  BUN 13  CREATININE 1.12*  CALCIUM 9.7   ------------------------------------------------------------------------------------------------------------------ estimated creatinine clearance is 33.4 ml/min (by C-G formula based on Cr of 1.12). ------------------------------------------------------------------------------------------------------------------ No results found for this basename: TSH, T4TOTAL, FREET3, T3FREE, THYROIDAB,  in the last 72 hours   Coagulation profile No results found for this basename: INR, PROTIME,  in the last 168 hours ------------------------------------------------------------------------------------------------------------------- No results found for this basename: DDIMER,  in the last 72 hours -------------------------------------------------------------------------------------------------------------------  Cardiac Enzymes  Recent Labs Lab 11/29/13 0738  TROPONINI <0.30   ------------------------------------------------------------------------------------------------------------------ No components found with this basename: POCBNP,    ---------------------------------------------------------------------------------------------------------------  Urinalysis    Component Value Date/Time   COLORURINE YELLOW 09/21/2013 1504   APPEARANCEUR CLEAR 09/21/2013 1504   LABSPEC 1.017 09/21/2013 1504   PHURINE 6.5 09/21/2013 1504   GLUCOSEU NEGATIVE 09/21/2013 1504   HGBUR SMALL* 09/21/2013 1504   BILIRUBINUR NEGATIVE 09/21/2013 1504   KETONESUR NEGATIVE 09/21/2013 1504   PROTEINUR NEGATIVE 09/21/2013 1504   UROBILINOGEN 0.2 09/21/2013 1504   NITRITE NEGATIVE 09/21/2013 1504   LEUKOCYTESUR NEGATIVE 09/21/2013 1504     ----------------------------------------------------------------------------------------------------------------  Imaging results:   Dg Chest Portable 1 View  11/29/2013   CLINICAL DATA:  SHORTNESS OF BREATH COUGH WHEEZING HEADACHE  EXAM: PORTABLE CHEST - 1 VIEW  COMPARISON:  Chest radiograph September 21, 2013  FINDINGS: Cardiac silhouette is unremarkable and unchanged. Mildly calcified aortic knob. Similar slightly increased lung volumes with mild chronic interstitial changes, no pleural effusions or focal consolidations. No pneumothorax.  Biventricular cardiac defibrillator in left chest. Multiple EKG lines overlie the patient and may obscure subtle underlying pathology. Patient is rotated the left. Soft tissue planes and included osseous structures are nonsuspicious.  IMPRESSION: COPD without superimposed acute  cardiopulmonary process.   Electronically Signed   By: Awilda Metro   On: 11/29/2013 05:50    My personal review of EKG: Rhythm A.Paced,   no Acute ST changes    Assessment & Plan   1. Acute on chronic respiratory failure in a patient with advanced COPD on 2 L nasal cannula oxygen with underlying pulmonary hypertension. Secondary to COPD exacerbation. She will be admitted to telemetry bed, she will be placed on IV Solu-Medrol along with Levaquin, scheduled and as needed nebulizer treatments. Monitor clinically.    2. Chronic combined systolic and diastolic heart failure EF 30% with AICD. Currently probe BNP is slightly elevated with some subjective weight gain over the last few days, however minimal to no rales no increased JVD or edema. One time IV Lasix 20 mg. Continue on home dose beta blocker.    3. Hypertension. Stable continue on home dose beta blocker and calcium channel blocker combination, is been noted to be on Cardizem and Norvasc combination along with beta blocker will defer this to her primary cardiologist Dr. Graciela Husbands in the outpatient setting to readjust if  needed.    4. Hypothyroidism. Continue home dose Synthroid.     5. Chr. anemia due to comminution of B12 deficiency and anemia of chronic disease. Stable no acute issues.     6. CKD II baseline creatinine around 1.2. At baseline monitor.      DVT Prophylaxis Heparin   AM Labs Ordered, also please review Full Orders  Family Communication: Admission, patients condition and plan of care including tests being ordered have been discussed with the patient and husband who indicate understanding and agree with the plan and Code Status.  Code Status DNR  Likely DC to  Home  Condition GUARDED   Time spent in minutes : 35    Leroy Sea M.D on 11/29/2013 at 8:59 AM  Between 7am to 7pm - Pager - [phone]  After 7pm go to [email or web address] - password [email or web address]  And look for the night coverage person covering me after hours  [facility] Office  [phone]

## 2013-11-29 NOTE — Progress Notes (Addendum)
INITIAL NUTRITION ASSESSMENT  DOCUMENTATION CODES Per approved criteria  -Severe malnutrition in the context of chronic illness   INTERVENTION: RD provided education on High Calorie High Protein Nutrition Therapy, provided "COPD Nutrition Therapy" handout. Resource Breeze po BID, each supplement provides 250 kcal and 9 grams of protein, orange. Downgrade diet to Dysphagia 3. RD to continue to follow nutrition care plan.  NUTRITION DIAGNOSIS: Increased nutrient needs related to COPD as evidenced by estimated needs.   Goal: Intake to meet >90% of estimated nutrition needs.  Monitor:  weight trends, lab trends, I/O's, PO intake, supplement tolerance  Reason for Assessment: Malnutrition Screening Tool  80 y.o. female  Admitting Dx: Acute respiratory failure  ASSESSMENT: PMHx significant for CHF, COPD, HTN, DM2. Admitted with productive cough and SOB x 3 days. Work-up reveals COPD exacerbation.  Currently eating 75% of her Heart Healthy diet. Pt repots that she had a salad for lunch and could not eat it because the lettuce made her cough - would like a softer diet. Pt doesn't like Ensure, but patient did agree to take [employer]. She states that her appetite has been poor for at least 1 month, nothing tastes good to her.  Nutrition Focused Physical Exam:   Subcutaneous Fat:  Orbital Region: WNL  Upper Arm Region: moderate depletion  Thoracic and Lumbar Region: N/A   Muscle:  Temple Region: mild depletion  Clavicle Bone Region: severe depletion  Clavicle and Acromion Bone Region: severe depletion  Scapular Bone Region: N/A  Dorsal Hand: severe depletion  Patellar Region: moderate to severe depletion Anterior Thigh Region: moderate to severe depletion Posterior Calf Region: moderate to severe depletion  Edema: absent   Patient meets criteria for severe malnutrition in the context of chronic illness as evidenced by intake of <75% x at least 1 month, and severe muscle  mass depletion. Weight loss of 6% x 3-4 months is not significant.   Height: Ht Readings from Last 1 Encounters:  11/29/13 5\' 4"  (1.626 m)    Weight: Wt Readings from Last 1 Encounters:  11/29/13 122 lb (55.339 kg)    Ideal Body Weight: 120 lb  % Ideal Body Weight: 102%  Wt Readings from Last 10 Encounters:  11/29/13 122 lb (55.339 kg)  11/25/13 125 lb 6.4 oz (56.881 kg)  11/05/13 126 lb (57.153 kg)  11/02/13 124 lb (56.246 kg)  10/22/13 126 lb (57.153 kg)  09/22/13 117 lb 15.1 oz (53.499 kg)  09/15/13 124 lb (56.246 kg)  08/30/13 128 lb 8 oz (58.287 kg)  08/16/13 131 lb (59.421 kg)  05/24/13 120 lb (54.432 kg)    Usual Body Weight: 130 lb  % Usual Body Weight: 94%  BMI:  Body mass index is 20.93 kg/(m^2). WNL  Estimated Nutritional Needs: Kcal: 1600 - 1800 Protein: 75 - 90 g Fluid: 1.5 liters daily  Skin: intact  Diet Order: Cardiac with 1.5 liters daily  EDUCATION NEEDS: -Education needs addressed   Intake/Output Summary (Last 24 hours) at 11/29/13 1504 Last data filed at 11/29/13 1400  Gross per 24 hour  Intake    600 ml  Output   1000 ml  Net   -400 ml    Last BM: 4/12  Labs:   Recent Labs Lab 11/29/13 0539 11/29/13 1021  NA 140  --   K 3.9  --   CL 98  --   CO2 27  --   BUN 13  --   CREATININE 1.12* 1.02  CALCIUM 9.7  --  GLUCOSE 131*  --     CBG (last 3)  No results found for this basename: GLUCAP,  in the last 72 hours  Scheduled Meds: . amLODipine  5 mg Oral Daily  . diltiazem  240 mg Oral Daily  . heparin  5,000 Units Subcutaneous 3 times per day  . levofloxacin  750 mg Oral Q48H  . [START ON 11/30/2013] levothyroxine  112 mcg Oral QAC breakfast  . lidocaine  1 patch Transdermal Q24H  . methylPREDNISolone (SOLU-MEDROL) injection  60 mg Intravenous 3 times per day  . metoCLOPramide  5 mg Oral TID AC  . multivitamin with minerals  1 tablet Oral Daily  . nebivolol  10 mg Oral Daily  . pantoprazole  40 mg Oral Daily  .  rOPINIRole  0.25 mg Oral Daily  . sodium chloride  3 mL Intravenous Q12H  . tiotropium  18 mcg Inhalation Daily    Continuous Infusions:   Past Medical History  Diagnosis Date  . CONGESTIVE HEART FAILURE 06/03/2007  . COPD 06/03/2007  . HYPERTENSION 06/03/2007  . HYPOTHYROIDISM 06/03/2007  . Hernia   . Asthma   . Diabetes mellitus   . Hyperlipidemia   . Arthritis   . Osteoporosis   . Cardiomyopathy, nonischemic     Irritable ejection fractions most recently EF 60% 3/14>> EF 30% 10/14  . Biventricular pacemaker -Medtronic]     Downgrade from CRT-D; LV and RV rate sense portions switched with subsequent fracture of morning rate sense portion in the LV port  . 6949-lead     Rate sense no LV port with subsequent fracture; high-voltage inactivated following down rate    Past Surgical History  Procedure Laterality Date  . Cardiac defibrillator placement    . Appendectomy    . Hysterectomy - unknown type    . Posterior fusion clivus-c2 transarticular stealth guided w/ icbg    . Cystectomy      elbow  . Icd      medtronic maximo  . Polypectomy      bladder  . Esophagogastroduodenoscopy N/A 10/30/2012    Procedure: ESOPHAGOGASTRODUODENOSCOPY (EGD);  Surgeon: Vertell NovakJames L Edwards Jr., MD;  [facility];  Service: Endoscopy;  Laterality: N/A;    Jarold MottoSamantha Marja Adderley MS, RD, LDN Inpatient Registered Dietitian Pager: [phone] After-hours pager: [phone]

## 2013-11-29 NOTE — Care Management Note (Addendum)
    Page 1 of 2   12/01/2013     2:06:07 PM   CARE MANAGEMENT NOTE 12/01/2013  Patient:  [name], [name]   Account Number:  [id number]  Date Initiated:  11/29/2013  Documentation initiated by:  [facility]  Subjective/Objective Assessment:   80 y.o. female, PMHx chronic systolic and diastolic heart failure EF 30% patient has AICD, COPD, hypertension, hypothyroidism, dyslipidemia, diabetes mellitus type 2//Home with spouse     Action/Plan:   Steroids, bronchodilator//Access for Home Health needs   Anticipated DC Date:  12/02/2013   Anticipated DC Plan:  HOME W HOME HEALTH SERVICES      DC Planning Services  CM consult      [facility] Choice  HOME HEALTH   Choice offered to / List presented to:          [facility] arranged  HH-1 RN  HH-10 DISEASE MANAGEMENT  HH-2 PT      [facility] agency  [facility]   Status of service:  In process, will continue to follow Medicare Important Message given?   (If response is "NO", the following Medicare IM given date fields will be blank) Date Medicare IM given:   Date Additional Medicare IM given:    Discharge Disposition:    Per UR Regulation:  Reviewed for med. necessity/level of care/duration of stay  If discussed at Long Length of Stay Meetings, dates discussed:    Comments:  12/01/13 1400 Shiri Hodapp, RN, BSN, [place] [phone] Pt currently active with [facility] for RN/PT services.  Resumption of care requested.  Ayesha Rumpf, RN of [place] notified.  No DME needs identified at this time.

## 2013-11-29 NOTE — ED Notes (Signed)
Neb complete , pt placed back on 2 liters n/c

## 2013-11-29 NOTE — Progress Notes (Signed)
ANTIBIOTIC CONSULT NOTE - INITIAL  Pharmacy Consult for Levofloxacin  Indication: COPD exacerbation  Allergies  Allergen Reactions  . Ace Inhibitors Nausea And Vomiting  . Acetaminophen-Codeine Nausea And Vomiting  . Aspirin     Stomach upset  . Atorvastatin Nausea And Vomiting    lipitor  . Bisoprolol Fumarate Nausea And Vomiting    zebeta  . Ezetimibe-Simvastatin Nausea And Vomiting    vytorin  . Fluvastatin Sodium Nausea And Vomiting    lescol  . Gabapentin Nausea And Vomiting    neurontin  . Gemfibrozil Nausea And Vomiting    Lopid   . Ibuprofen     Motrin- advil= stomach upset  . Lisinopril Nausea And Vomiting    Prinivil, zestril  . Oxycodone Hcl Nausea And Vomiting  . Rosuvastatin Nausea And Vomiting    crestor  . Tylenol [Acetaminophen]     Stomach upset    Patient Measurements: Height: 5\' 4"  (162.6 cm) Weight: 122 lb (55.339 kg) IBW/kg (Calculated) : 54.7   Vital Signs: Temp: 99.1 F (37.3 C) (04/13 0829) Temp src: Oral (04/13 0829) BP: 149/64 mmHg (04/13 0829) Pulse Rate: 96 (04/13 0829) Intake/Output from previous day:   Intake/Output from this shift:    Labs:  Recent Labs  11/29/13 0539  WBC 10.1  HGB 10.1*  PLT 331  CREATININE 1.12*   Estimated Creatinine Clearance: 33.4 ml/min (by C-G formula based on Cr of 1.12). No results found for this basename: VANCOTROUGH, VANCOPEAK, VANCORANDOM, GENTTROUGH, GENTPEAK, GENTRANDOM, TOBRATROUGH, TOBRAPEAK, TOBRARND, AMIKACINPEAK, AMIKACINTROU, AMIKACIN,  in the last 72 hours   Microbiology: No results found for this or any previous visit (from the past 720 hour(s)).  Medical History: Past Medical History  Diagnosis Date  . CONGESTIVE HEART FAILURE 06/03/2007  . COPD 06/03/2007  . HYPERTENSION 06/03/2007  . HYPOTHYROIDISM 06/03/2007  . Hernia   . Asthma   . Diabetes mellitus   . Hyperlipidemia   . Arthritis   . Osteoporosis   . Cardiomyopathy, nonischemic     Irritable ejection  fractions most recently EF 60% 3/14>> EF 30% 10/14  . Biventricular pacemaker -Medtronic]     Downgrade from CRT-D; LV and RV rate sense portions switched with subsequent fracture of morning rate sense portion in the LV port  . 6949-lead     Rate sense no LV port with subsequent fracture; high-voltage inactivated following down rate    Assessment: 84yof admitted with SOB, wheeze and productive cough.  She is being treated with IV solumedrol for COPD exacerbation.  Plan to add broad spectrum ABX coverage - dose adjusted for renal function.  Renal stable, afebrile, WBC upper limit normal.   Goal of Therapy:  eradication of infection   Plan:  Levofloxacin 750mg  po q48hr x3 doses Pharmacy to sign off  Leota Sauers Pharm.D. CPP, BCPS Clinical Pharmacist [phone] 11/29/2013 9:49 AM

## 2013-11-29 NOTE — ED Notes (Signed)
Report called to floor

## 2013-11-29 NOTE — Progress Notes (Signed)
Utilization Review Completed Pierra Skora J. Tymika Grilli, RN, BSN, NCM [phone]  

## 2013-11-29 NOTE — ED Notes (Signed)
RT at [facility] for 1 hr CAT neb

## 2013-11-29 NOTE — ED Notes (Signed)
Husband arrives to [facility], Dr. Norlene Campbell into room, neb remains in progress, pending pCXR.

## 2013-11-30 ENCOUNTER — Inpatient Hospital Stay (HOSPITAL_COMMUNITY): Payer: Medicare Other

## 2013-11-30 ENCOUNTER — Encounter: Payer: Medicare Other | Admitting: Cardiology

## 2013-11-30 DIAGNOSIS — J9620 Acute and chronic respiratory failure, unspecified whether with hypoxia or hypercapnia: Principal | ICD-10-CM

## 2013-11-30 DIAGNOSIS — J449 Chronic obstructive pulmonary disease, unspecified: Secondary | ICD-10-CM

## 2013-11-30 LAB — BASIC METABOLIC PANEL
BUN: 23 mg/dL (ref 6–23)
CHLORIDE: 95 meq/L — AB (ref 96–112)
CO2: 29 mEq/L (ref 19–32)
CREATININE: 1.08 mg/dL (ref 0.50–1.10)
Calcium: 9.4 mg/dL (ref 8.4–10.5)
GFR, EST AFRICAN AMERICAN: 54 mL/min — AB (ref >90)
GFR, EST NON AFRICAN AMERICAN: 46 mL/min — AB (ref >90)
Glucose, Bld: 160 mg/dL — ABNORMAL HIGH (ref 70–99)
Potassium: 4.2 mEq/L (ref 3.7–5.3)
Sodium: 137 mEq/L (ref 137–147)

## 2013-11-30 LAB — CBC
HEMATOCRIT: 29.6 % — AB (ref 36.0–46.0)
Hemoglobin: 9.3 g/dL — ABNORMAL LOW (ref 12.0–15.0)
MCH: 25.9 pg — ABNORMAL LOW (ref 26.0–34.0)
MCHC: 31.4 g/dL (ref 30.0–36.0)
MCV: 82.5 fL (ref 78.0–100.0)
Platelets: 295 10*3/uL (ref 150–400)
RBC: 3.59 MIL/uL — ABNORMAL LOW (ref 3.87–5.11)
RDW: 16.5 % — ABNORMAL HIGH (ref 11.5–15.5)
WBC: 13.1 10*3/uL — ABNORMAL HIGH (ref 4.0–10.5)

## 2013-11-30 MED ORDER — FUROSEMIDE 10 MG/ML IJ SOLN
INTRAMUSCULAR | Status: AC
  Filled 2013-11-30: qty 4

## 2013-11-30 MED ORDER — FUROSEMIDE 20 MG PO TABS
20.0000 mg | ORAL_TABLET | Freq: Every day | ORAL | Status: DC
  Administered 2013-12-01: 20 mg via ORAL
  Filled 2013-11-30: qty 1

## 2013-11-30 NOTE — Evaluation (Signed)
Physical Therapy Evaluation Patient Details Name: [name] MRN: [id number] DOB: [birth year] Today's Date: 11/30/2013   History of Present Illness  Admitted with SOB/COPD exacerbation  Clinical Impression  Pt functioning near baseline. Pt with generalized weakness and deconditioning with decreased activity tolerance. +SOB with short distance ambulation. Pt with HHPT services PTA, recommend con't of those services and 24/7 supervision which spouse can provide. Acute PT to follow to prevent further deconditioning.    Follow Up Recommendations Home health PT;Supervision/Assistance - 24 hour (con't of services from [facility])    Equipment Recommendations  None recommended by PT    Recommendations for Other Services       Precautions / Restrictions Precautions Precautions: Fall Precaution Comments: on 2 LO2 via [place] at home Restrictions Weight Bearing Restrictions: No      Mobility  Bed Mobility Overal bed mobility: Needs Assistance Bed Mobility: Supine to Sit     Supine to sit: Supervision     General bed mobility comments: increased time, use of bed rail  Transfers Overall transfer level: Needs assistance Equipment used: Rolling walker (2 wheeled) Transfers: Sit to/from Stand Sit to Stand: Min assist         General transfer comment: v/c's for safe hand placement  Ambulation/Gait Ambulation/Gait assistance: Min guard Ambulation Distance (Feet): 50 Feet Assistive device: Rolling walker (2 wheeled) Gait Pattern/deviations: Step-through pattern;Decreased stride length;Trunk flexed Gait velocity: slow   General Gait Details: + SOB, 2 standing rest breaks. SpO2 at 95% on 2LO2 via [place] upon return from [employer]    Modified Rankin (Stroke Patients Only)       Balance Overall balance assessment: Needs assistance Sitting-balance support: Feet supported;No upper extremity supported Sitting balance-Leahy Scale: Fair      Standing balance support:  (requires use of RW for safe standing balance)                                 Pertinent Vitals/Pain Denies pain    Home Living Family/patient expects to be discharged to:: Private residence Living Arrangements: Spouse/significant other Available Help at Discharge: Family;Available 24 hours/day Type of Home: House Home Access: Stairs to enter Entrance Stairs-Rails: Can reach both Entrance Stairs-Number of Steps: 4 Home Layout: Two level;Able to live on main level with bedroom/bathroom Home Equipment: Dan Humphreys - 2 wheels;Cane - single point;Shower seat      Prior Function Level of Independence: Needs assistance   Gait / Transfers Assistance Needed: uses cane in house and RW outside but recently has had to use RW in home  ADL's / Homemaking Assistance Needed: occasional assist for bathing/dressing, husband does the cooking and cleaning        Hand Dominance        Extremity/Trunk Assessment   Upper Extremity Assessment: Generalized weakness           Lower Extremity Assessment: Generalized weakness         Communication   Communication: No difficulties  Cognition Arousal/Alertness: Awake/alert Behavior During Therapy: WFL for tasks assessed/performed Overall Cognitive Status: Within Functional Limits for tasks assessed                      General Comments      Exercises        Assessment/Plan    PT Assessment Patient needs continued PT services  PT  Diagnosis Difficulty walking;Generalized weakness   PT Problem List Decreased strength;Decreased activity tolerance;Decreased balance;Decreased mobility;Cardiopulmonary status limiting activity  PT Treatment Interventions DME instruction;Gait training;Stair training;Functional mobility training;Therapeutic activities;Therapeutic exercise   PT Goals (Current goals can be found in the Care Plan section) Acute Rehab PT Goals Patient Stated Goal: home PT  Goal Formulation: With patient Time For Goal Achievement: 12/07/13 Potential to Achieve Goals: Good    Frequency Min 3X/week   Barriers to discharge        Co-evaluation               End of Session Equipment Utilized During Treatment: Gait belt Activity Tolerance: Patient limited by fatigue (SOB) Patient left: in chair;with call bell/phone within reach Nurse Communication: Mobility status         Time: [id number] PT Time Calculation (min): 21 min   Charges:   PT Evaluation $Initial PT Evaluation Tier I: 1 Procedure PT Treatments $Gait Training: 8-22 mins   PT G CodesRozell Searing:          [place] Warzecha M Yuriy Cui 11/30/2013, 9:25 AM  Lewis ShockAshly Briggs Edelen, PT, DPT Pager #: [phone] Office #: [phone]

## 2013-11-30 NOTE — Progress Notes (Signed)
[facility] PROGRESS NOTE  [name] UJW:[id number] DOB: 09/21/1929 DOA: 11/29/2013 PCP: Nadean CorwinMCKEOWN,WILLIAM DAVID, MD  Assessment/Plan: 1. Acute on chronic respiratory failure in a patient with advanced COPD on 2 L nasal cannula oxygen with underlying pulmonary hypertension: Secondary to COPD exacerbation and mild CHF exacerbation -patient feeling better and with improved breathing -1 pound off since admission -patient is still wheezing and with fine crackles at bases -will continue solumedrol, nebulizer treatment, pulmicort and antibiotics -continue flutter valve  2. 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 Currently probe BNP is slightly elevated with some subjective weight gain PTA. -will give 1 more dose lasix IV today -start lasix 20 mg daily on 4/15 -continue low sodium diet -strict I's and O's -daily weights  3. Hypertension. Stable continue on home dose beta blocker and calcium channel blocker combination, is been noted to be on Cardizem and Norvasc combination along with beta blocker will defer this to her primary cardiologist [name] in the outpatient setting to readjust/changed if needed.  4. Hypothyroidism. Continue home dose Synthroid.   5. Chr. anemia due to comminution of B12 deficiency and anemia of chronic disease. Stable no acute issues. -Hgb stable   6. CKD II baseline creatinine around 1.2. At baseline monitor.  7.physical deconditioning: will ask PT to provide eval and treatment.    Code Status: DNR Family Communication: husband at bedside Disposition Plan: to be determine   Consultants:  None   Procedures:  See below for x-ray reports  Antibiotics:  levaquin  HPI/Subjective: Reports feeling better and with improved breathing. Denies CP and fever.  Objective: Filed Vitals:   11/30/13 1505  BP: 153/63  Pulse: 74  Temp: 98 F (36.7 C)  Resp: 18    Intake/Output Summary (Last 24  hours) at 11/30/13 2038 Last data filed at 11/30/13 1822  Gross per 24 hour  Intake   1570 ml  Output   1250 ml  Net    320 ml   Filed Weights   11/29/13 0829 11/30/13 0603  Weight: 55.339 kg (122 lb) 55.2 kg (121 lb 11.1 oz)    Exam:   General:  Feeling better, no CP, no fever  Cardiovascular: regular rate, no rubs or gallops, no JVD  Respiratory: positive exp wheezing, decrease BS at bases and fine crackles  Abdomen: soft, NT, ND positive BS  Musculoskeletal: no edema, no cyanosis  Data Reviewed: Basic Metabolic Panel:  Recent Labs Lab 11/29/13 0539 11/29/13 1021 11/30/13 0509  NA 140  --  137  K 3.9  --  4.2  CL 98  --  95*  CO2 27  --  29  GLUCOSE 131*  --  160*  BUN 13  --  23  CREATININE 1.12* 1.02 1.08  CALCIUM 9.7  --  9.4   CBC:  Recent Labs Lab 11/29/13 0539 11/29/13 1021 11/30/13 0509  WBC 10.1 8.3 13.1*  NEUTROABS 6.9  --   --   HGB 10.1* 9.5* 9.3*  HCT 32.9* 31.1* 29.6*  MCV 84.8 84.3 82.5  PLT 331 296 295   Cardiac Enzymes:  Recent Labs Lab 11/29/13 0738 11/29/13 1310 11/29/13 1918  TROPONINI <0.30 <0.30 <0.30   BNP (last 3 results)  Recent Labs  05/24/13 1323 11/29/13 0738  PROBNP 90.0 3956.0*    Studies: Dg Chest 2 View  11/30/2013   CLINICAL DATA:  Shortness of breath  EXAM: CHEST  2 VIEW  COMPARISON:  11/29/2013  FINDINGS: A  defibrillator is again seen. The cardiac shadow is stable. The lungs are well aerated bilaterally without focal acute infiltrate. Mild hyperinflation remains. No bony abnormality is seen.  IMPRESSION: COPD without acute abnormality.   Electronically Signed   By: Alcide Clever M.D.   On: 11/30/2013 07:58   Dg Chest Portable 1 View  11/29/2013   CLINICAL DATA:  SHORTNESS OF BREATH COUGH WHEEZING HEADACHE  EXAM: PORTABLE CHEST - 1 VIEW  COMPARISON:  Chest radiograph September 21, 2013  FINDINGS: Cardiac silhouette is unremarkable and unchanged. Mildly calcified aortic knob. Similar slightly increased lung  volumes with mild chronic interstitial changes, no pleural effusions or focal consolidations. No pneumothorax.  Biventricular cardiac defibrillator in left chest. Multiple EKG lines overlie the patient and may obscure subtle underlying pathology. Patient is rotated the left. Soft tissue planes and included osseous structures are nonsuspicious.  IMPRESSION: COPD without superimposed acute cardiopulmonary process.   Electronically Signed   By: Awilda Metro   On: 11/29/2013 05:50    Scheduled Meds: . amLODipine  5 mg Oral Daily  . budesonide (PULMICORT) nebulizer solution  0.25 mg Nebulization BID  . diltiazem  240 mg Oral Daily  . feeding supplement (RESOURCE BREEZE)  1 Container Oral BID BM  . [START ON 12/01/2013] furosemide  20 mg Oral Daily  . heparin  5,000 Units Subcutaneous 3 times per day  . levofloxacin  750 mg Oral Q48H  . levothyroxine  112 mcg Oral QAC breakfast  . lidocaine  1 patch Transdermal Q24H  . methylPREDNISolone (SOLU-MEDROL) injection  60 mg Intravenous 3 times per day  . metoCLOPramide  5 mg Oral TID AC  . multivitamin with minerals  1 tablet Oral Daily  . nebivolol  10 mg Oral Daily  . pantoprazole  40 mg Oral Daily  . rOPINIRole  0.25 mg Oral Daily  . sodium chloride  3 mL Intravenous Q12H  . tiotropium  18 mcg Inhalation Daily     Time spent: >30 minutes   Vassie Loll  [facility] Pager [phone]. If 7PM-7AM, please contact night-coverage at [email or web address], password [facility] 11/30/2013, 8:38 PM  LOS: 1 day

## 2013-11-30 NOTE — Progress Notes (Signed)
Orthostatic Vital signs performed by RN. VS documented. Will continue to monitor.

## 2013-12-01 ENCOUNTER — Ambulatory Visit: Payer: Self-pay | Admitting: Internal Medicine

## 2013-12-01 DIAGNOSIS — I5022 Chronic systolic (congestive) heart failure: Secondary | ICD-10-CM

## 2013-12-01 DIAGNOSIS — E43 Unspecified severe protein-calorie malnutrition: Secondary | ICD-10-CM

## 2013-12-01 NOTE — Progress Notes (Addendum)
[facility] PROGRESS NOTE Assessment/Plan: Acute on chronic respiratory failure in a patient with advanced COPD on 2 L nasal cannula oxygen with underlying pulmonary hypertension: Secondary to COPD exacerbation: - patient feeling better and with improved breathing  - patient is still wheezing and with fine crackles at bases  - will continue solumedrol, nebulizer treatment, pulmicort and antibiotics  - continue flutter valve. - dizzy upon standing hold lasix.  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  - Currently probe BNP is slightly elevated with some subjective weight gain PTA.  - continue low sodium diet  - strict I's and O's  - daily weights   Hypertension: - Cont home dose beta blocker and calcium channel blocker combination, is been noted to be on Cardizem and Norvasc combination along with beta blocker will defer this to her primary cardiologist [name] in the outpatient setting to readjust/changed if needed.   Hypothyroidism: - Continue home dose Synthroid.   Chronic anemia due to comminution of B12 deficiency and anemia of chronic disease.  - Stable no acute issues.  -Hgb stable   CKD II baseline creatinine around 1.2: - At baseline monitor.   physical deconditioning: will ask PT to provide eval and treatment.       Code Status: DNR  Family Communication: husband at bedside  Disposition Plan: to be determine    Consultants:  none  Procedures:  CXR  Antibiotics:  levaquin  HPI/Subjective: Breathing better dizzy upon standing  Objective: Filed Vitals:   11/30/13 2308 12/01/13 0455 12/01/13 0922 12/01/13 1033  BP: 147/77 166/66  148/50  Pulse: 76 70    Temp:  97.7 F (36.5 C)    TempSrc:  Oral    Resp:  20    Height:      Weight:  54.8 kg (120 lb 13 oz)    SpO2: 97%  94%     Intake/Output Summary (Last 24 hours) at 12/01/13 1240 Last data filed at 12/01/13 0821  Gross per 24 hour  Intake    1200 ml  Output    900 ml  Net    300 ml   Filed Weights   11/29/13 0829 11/30/13 0603 12/01/13 0455  Weight: 55.339 kg (122 lb) 55.2 kg (121 lb 11.1 oz) 54.8 kg (120 lb 13 oz)    Exam:  General: Alert, awake, oriented x3, in no acute distress.  HEENT: No bruits, no goiter. -JVD Heart: Regular rate and rhythm, without murmurs, rubs, gallops.  Lungs: Good air movement, wheezing B/l crackles at bases Abdomen: Soft, nontender, nondistended, positive bowel sounds.  Neuro: Grossly intact, nonfocal.   Data Reviewed: Basic Metabolic Panel:  Recent Labs Lab 11/29/13 0539 11/29/13 1021 11/30/13 0509  NA 140  --  137  K 3.9  --  4.2  CL 98  --  95*  CO2 27  --  29  GLUCOSE 131*  --  160*  BUN 13  --  23  CREATININE 1.12* 1.02 1.08  CALCIUM 9.7  --  9.4   Liver Function Tests: No results found for this basename: AST, ALT, ALKPHOS, BILITOT, PROT, ALBUMIN,  in the last 168 hours No results found for this basename: LIPASE, AMYLASE,  in the last 168 hours No results found for this basename: AMMONIA,  in the last 168 hours CBC:  Recent Labs Lab 11/29/13 0539 11/29/13 1021 11/30/13 0509  WBC 10.1 8.3 13.1*  NEUTROABS 6.9  --   --  HGB 10.1* 9.5* 9.3*  HCT 32.9* 31.1* 29.6*  MCV 84.8 84.3 82.5  PLT 331 296 295   Cardiac Enzymes:  Recent Labs Lab 11/29/13 0738 11/29/13 1310 11/29/13 1918  TROPONINI <0.30 <0.30 <0.30   BNP (last 3 results)  Recent Labs  05/24/13 1323 11/29/13 0738  PROBNP 90.0 3956.0*   CBG: No results found for this basename: GLUCAP,  in the last 168 hours  No results found for this or any previous visit (from the past 240 hour(s)).   Studies: Dg Chest 2 View  11/30/2013   CLINICAL DATA:  Shortness of breath  EXAM: CHEST  2 VIEW  COMPARISON:  11/29/2013  FINDINGS: A defibrillator is again seen. The cardiac shadow is stable. The lungs are well aerated bilaterally without focal acute infiltrate. Mild hyperinflation remains. No bony  abnormality is seen.  IMPRESSION: COPD without acute abnormality.   Electronically Signed   By: Alcide Clever M.D.   On: 11/30/2013 07:58    Scheduled Meds: . amLODipine  5 mg Oral Daily  . budesonide (PULMICORT) nebulizer solution  0.25 mg Nebulization BID  . diltiazem  240 mg Oral Daily  . feeding supplement (RESOURCE BREEZE)  1 Container Oral BID BM  . furosemide  20 mg Oral Daily  . heparin  5,000 Units Subcutaneous 3 times per day  . levofloxacin  750 mg Oral Q48H  . levothyroxine  112 mcg Oral QAC breakfast  . lidocaine  1 patch Transdermal Q24H  . methylPREDNISolone (SOLU-MEDROL) injection  60 mg Intravenous 3 times per day  . metoCLOPramide  5 mg Oral TID AC  . multivitamin with minerals  1 tablet Oral Daily  . nebivolol  10 mg Oral Daily  . pantoprazole  40 mg Oral Daily  . rOPINIRole  0.25 mg Oral Daily  . sodium chloride  3 mL Intravenous Q12H  . tiotropium  18 mcg Inhalation Daily   Continuous Infusions:    Marinda Elk  [facility] Pager [phone]. If 8PM-8AM, please contact night-coverage at [email or web address], password [facility] 12/01/2013, 12:40 PM  LOS: 2 days

## 2013-12-01 NOTE — Progress Notes (Signed)
SATURATION QUALIFICATIONS: (This note is used to comply with regulatory documentation for home oxygen)  Patient Saturations on Room Air at Rest 92$  Patient Saturations on Room Air while Ambulating =88%  Patient Saturations on 3 Liters of oxygen while Ambulating =93%  Please briefly explain why patient needs home oxygen: pt desats when walking without 02 on . Saturation went  up to 93% with 02 at 3l/m [place] . Pt with exertional dyspnea while waaaalking

## 2013-12-02 ENCOUNTER — Other Ambulatory Visit: Payer: Self-pay | Admitting: Emergency Medicine

## 2013-12-02 ENCOUNTER — Other Ambulatory Visit: Payer: Self-pay | Admitting: Internal Medicine

## 2013-12-02 ENCOUNTER — Other Ambulatory Visit: Payer: Self-pay | Admitting: *Deleted

## 2013-12-02 MED ORDER — LEVOTHYROXINE SODIUM 112 MCG PO TABS: 112.0000 ug | ORAL_TABLET | Freq: Every day | ORAL | Status: DC

## 2013-12-02 MED ORDER — LOSARTAN POTASSIUM 25 MG PO TABS
25.0000 mg | ORAL_TABLET | Freq: Every day | ORAL | Status: DC
  Administered 2013-12-02: 25 mg via ORAL
  Filled 2013-12-02: qty 1

## 2013-12-02 MED ORDER — LEVOFLOXACIN 750 MG PO TABS: 750.0000 mg | ORAL_TABLET | ORAL | Status: DC

## 2013-12-02 MED ORDER — PREDNISONE 10 MG PO TABS: ORAL_TABLET | ORAL | Status: DC

## 2013-12-02 MED ORDER — LOSARTAN POTASSIUM 25 MG PO TABS: 25.0000 mg | ORAL_TABLET | Freq: Every day | ORAL | Status: DC

## 2013-12-02 NOTE — Progress Notes (Signed)
D/C Tele, D/C IV, D/C instructions reviewed with pt., pt. Prescriptions given to pt., pt.'s belongings sent with Husband, pt. Pt. Verbalized understanding of D/C instructions. Transported from unit via wheelchair by [employer], and transported home by her husband.

## 2013-12-02 NOTE — Progress Notes (Signed)
Physical Therapy Treatment Patient Details Name: Amy LevansGearldeane B Petras MRN: [id number] DOB: 12/19/1929 Today's Date: 12/02/2013    History of Present Illness Admitted with SOB/COPD exacerbation    PT Comments    Pt progressing towards physical therapy goals. Berg Balance Assessment completed this session with a score of 35, indicating a high risk of falls. Pt education for safety awareness at home and using the RW for support with OOB mobility. Pt anticipates d/c home today or tomorrow. Will continue to follow.   Follow Up Recommendations  Home health PT;Supervision/Assistance - 24 hour     Equipment Recommendations  None recommended by PT    Recommendations for Other Services       Precautions / Restrictions Precautions Precautions: Fall Precaution Comments: on 2 LO2 via [place] at home Restrictions Weight Bearing Restrictions: No    Mobility  Bed Mobility Overal bed mobility: Needs Assistance Bed Mobility: Supine to Sit     Supine to sit: Supervision     General bed mobility comments: increased time, use of bed rail  Transfers Overall transfer level: Needs assistance Equipment used: Rolling walker (2 wheeled) Transfers: Sit to/from Stand Sit to Stand: Supervision         General transfer comment: Pt demonstrated safe hand placement and safety awareness. Also demonstrated during the BERG the ability to sit and stand without using UE's for support.   Ambulation/Gait Ambulation/Gait assistance: Min guard Ambulation Distance (Feet): 50 Feet (25 feet x2) Assistive device: Rolling walker (2 wheeled) Gait Pattern/deviations: Step-through pattern;Decreased stride length;Trunk flexed Gait velocity: Decreased Gait velocity interpretation: Below normal speed for age/gender General Gait Details: Pt on 2L/min supplemental O2 throughout session. Slight SOB noted however pt states she feels fine and O2 sats remain 94-96%.    Stairs            Wheelchair Mobility     Modified Rankin (Stroke Patients Only)       Balance Overall balance assessment: Needs assistance Sitting-balance support: Feet supported;No upper extremity supported Sitting balance-Leahy Scale: Fair     Standing balance support: No upper extremity supported Standing balance-Leahy Scale: Fair                      Cognition Arousal/Alertness: Awake/alert Behavior During Therapy: WFL for tasks assessed/performed Overall Cognitive Status: Within Functional Limits for tasks assessed                      Exercises      General Comments        Pertinent Vitals/Pain Pt reports no pain throughout session and demonstrated minimal SOB during gait training. O2 saturations remain ~95% throughout session.     Home Living                      Prior Function            PT Goals (current goals can now be found in the care plan section) Acute Rehab PT Goals Patient Stated Goal: home PT Goal Formulation: With patient Time For Goal Achievement: 12/07/13 Potential to Achieve Goals: Good Progress towards PT goals: Progressing toward goals    Frequency  Min 3X/week    PT Plan Current plan remains appropriate    Co-evaluation             End of Session Equipment Utilized During Treatment: Gait belt;Oxygen Activity Tolerance: Patient tolerated treatment well Patient left: in chair;with call bell/phone within reach;with family/visitor present  Time: 5284-1324 PT Time Calculation (min): 26 min  Charges:  $Gait Training: 8-22 mins $Neuromuscular Re-education: 8-22 mins                    G Codes:      Ruthann Cancer Dec 28, 2013, 3:09 PM  Ruthann Cancer, PT, DPT Acute Rehabilitation Services Pager: [phone]

## 2013-12-02 NOTE — Discharge Summary (Signed)
Physician Discharge Summary  LETISHIA ELLIOTT WUJ:[id number] DOB: 11-30-1929 DOA: 11/29/2013  PCP: Nadean Corwin, MD  Admit date: 11/29/2013 Discharge date: 12/02/2013  Time spent: 35 minutes  Recommendations for Outpatient Follow-up:  Follow up with PCP in 2 weeks. Follow up with cardiology in 1 weeks.  Discharge Diagnoses:  Principal Problem:   Acute respiratory failure Active Problems:   HYPOTHYROIDISM   HYPERTENSION   PULMONARY HYPERTENSION, SECONDARY   Chronic systolic heart failure   COPD   Atrial fibrillation   Biventricular cardiac pacemaker Medtronic   Anemia, multi-factorial, secondary to B12 deficiency and anemia of chronic disease   T2 NIDDM w/ Stage IV CKD   Cardiomyopathy, nonischemic   Discharge Condition: stable  Diet recommendation: low sodium diet  Filed Weights   11/30/13 0603 12/01/13 0455 12/02/13 0615  Weight: 55.2 kg (121 lb 11.1 oz) 54.8 kg (120 lb 13 oz) 55.52 kg (122 lb 6.4 oz)    History of present illness:  80 y.o. female, with history of combined chronic systolic and diastolic heart failure EF 30% patient has AICD, COPD, hypertension, hypothyroidism, dyslipidemia, diabetes mellitus type 2 on diet control who was in her usual state of health until about 3 days ago when she started developing a productive cough and subsequently developed progressive shortness of breath and wheezing, symptoms continued to get worse she presented to the ER where her workup was consistent with acute respiratory failure secondary to COPD exacerbation and I was called to admit the patient.   Hospital Course:  Acute on chronic respiratory failure in a patient with advanced COPD on 2 L nasal cannula oxygen with underlying pulmonary hypertension: Secondary to COPD exacerbation:  - started on IV steroids, abx and inhalers. After 48hrs her breathing had improved. - patient feeling better and with improved breathing  - changed her steroid to oral for 1 7 day  tapared.  Chronic combined systolic and diastolic heart failure EF 30% with AICD.  - remain euvolemic. - cont current home dose of medications. - Currently not on lasix for her home regimen. To be evaluated by her cardiologist. - weight on admission: Weight change: 0.72 kg (1 lb 9.4 oz)  Hypertension:  - Cont home dose beta blocker and calcium channel blocker combination, is been noted to be on Cardizem and Norvasc combination along with beta blocker will defer this to her primary cardiologist Dr. Graciela Husbands in the outpatient setting to readjust/changed if needed.   Hypothyroidism:  - Continue home dose Synthroid.   Chronic anemia due to combination of B12 deficiency and anemia of chronic disease.  - Stable no acute issues.  -Hgb stable   CKD II baseline creatinine around 1.2:  - At baseline monitor.   physical deconditioning: will ask PT to provide eval and treatment.   Procedures:  CXR  Consultations:  none  Discharge Exam: Filed Vitals:   12/02/13 1100  BP: 161/51  Pulse: 58  Temp:   Resp:     General: A&o x3 Cardiovascular: RRR Respiratory: good air movement CTA B/L  Discharge Instructions You were cared for by a hospitalist during your hospital stay. If you have any questions about your discharge medications or the care you received while you were in the hospital after you are discharged, you can call the unit and asked to speak with the hospitalist on call if the hospitalist that took care of you is not available. Once you are discharged, your primary care physician will handle any further medical issues. Please note that NO  REFILLS for any discharge medications will be authorized once you are discharged, as it is imperative that you return to your primary care physician (or establish a relationship with a primary care physician if you do not have one) for your aftercare needs so that they can reassess your need for medications and monitor your lab values.       Discharge Orders   Future Appointments Provider Department Dept Phone   12/21/2013 1:00 PM Van Clines, MD [facility] Neurology [facility] [phone]   01/28/2014 3:00 PM [facility] CARDIOVASC[facility] [address] [phone]   01/28/2014 3:40 PM Carma Lair Nickel, NP [facility] [phone]   05/23/2014 2:00 PM Lucky Cowboy, MD [place] [facility] [phone]   Future Orders Complete By Expires   Diet - low sodium heart healthy  As directed    Increase activity slowly  As directed        Medication List         albuterol (2.5 MG/3ML) 0.083% nebulizer solution  Commonly known as:  PROVENTIL  Inhale 3 mLs into the lungs every 4 (four) hours as needed for wheezing or shortness of breath.     amLODipine 5 MG tablet  Commonly known as:  NORVASC  Take 1 tablet (5 mg total) by mouth daily.     clonazePAM 0.5 MG tablet  Commonly known as:  KLONOPIN  Take 0.5 tablets (0.25 mg total) by mouth at bedtime as needed (sleep).     diltiazem 240 MG 24 hr capsule  Commonly known as:  CARDIZEM CD  Take 240 mg by mouth daily.     FLUTTER Devi  Use as directed     FREESTYLE LITE test strip  Generic drug:  glucose blood  CHECK GLUCOSE ONCE A DAY FOR DIABETES     levofloxacin 750 MG tablet  Commonly known as:  LEVAQUIN  Take 1 tablet (750 mg total) by mouth every other day.     levothyroxine 112 MCG tablet  Commonly known as:  SYNTHROID, LEVOTHROID  Take 1 tablet (112 mcg total) by mouth daily before breakfast.     lidocaine 5 %  Commonly known as:  LIDODERM  Place 1 patch onto the skin daily. Remove & Discard patch within 12 hours or as directed by MD     losartan 25 MG tablet  Commonly known as:  COZAAR  Take 1 tablet (25 mg total) by mouth daily.     metoCLOPramide 5 MG tablet  Commonly known as:  REGLAN  Take 5 mg by mouth 3 (three) times daily before meals.     multivitamin with minerals Tabs tablet   Take 1 tablet by mouth daily.     nebivolol 10 MG tablet  Commonly known as:  BYSTOLIC  Take 1 tablet (10 mg total) by mouth daily.     omeprazole 20 MG capsule  Commonly known as:  PRILOSEC  Take 20 mg by mouth 2 (two) times daily.     predniSONE 10 MG tablet  Commonly known as:  DELTASONE  Takes 6 tablets for 1 days, then 5 tablets for 1 days, then 4 tablets for 1 days, then 3 tablets for 1 days, then 2 tabs for 1 days, then 1 tab for 1 days, and then stop.     rOPINIRole 0.25 MG tablet  Commonly known as:  REQUIP  Take 1 tablet (0.25 mg total) by mouth daily.     tiotropium 18 MCG inhalation capsule  Commonly known as:  SPIRIVA  Place 18 mcg into inhaler and inhale daily.     traMADol 50 MG tablet  Commonly known as:  ULTRAM  Take 1 tablet (50 mg total) by mouth every 6 (six) hours as needed for moderate pain.       Allergies  Allergen Reactions  . Apple Shortness Of Breath, Nausea Only and Other (See Comments)    Per patient tongue swelling. (apples/applesauce)  . Ace Inhibitors Nausea And Vomiting  . Acetaminophen-Codeine Nausea And Vomiting  . Aspirin     Stomach upset  . Atorvastatin Nausea And Vomiting    lipitor  . Bisoprolol Fumarate Nausea And Vomiting    zebeta  . Ezetimibe-Simvastatin Nausea And Vomiting    vytorin  . Fluvastatin Sodium Nausea And Vomiting    lescol  . Gabapentin Nausea And Vomiting    neurontin  . Gemfibrozil Nausea And Vomiting    Lopid   . Ibuprofen     Motrin- advil= stomach upset  . Lisinopril Nausea And Vomiting    Prinivil, zestril  . Oxycodone Hcl Nausea And Vomiting  . Rosuvastatin Nausea And Vomiting    crestor  . Strawberry   . Tylenol [Acetaminophen]     Stomach upset   Follow-up Information   [facility] ([facility]/PT)    Contact information:   [address] [place] [address] [phone]       Follow up with Nadean CorwinMCKEOWN,WILLIAM DAVID, MD In 2 weeks. (hospital follow up)     Specialty:  Internal Medicine   Contact information:   [address] [place] [address] [phone]       Follow up with Sherryl MangesSteven Klein, MD In 1 week. (hospital follow up)    Specialty:  Cardiology   Contact information:   [address] [place] [address] [phone]        The results of significant diagnostics from this hospitalization (including imaging, microbiology, ancillary and laboratory) are listed below for reference.    Significant Diagnostic Studies: Dg Chest 2 View  11/30/2013   CLINICAL DATA:  Shortness of breath  EXAM: CHEST  2 VIEW  COMPARISON:  11/29/2013  FINDINGS: A defibrillator is again seen. The cardiac shadow is stable. The lungs are well aerated bilaterally without focal acute infiltrate. Mild hyperinflation remains. No bony abnormality is seen.  IMPRESSION: COPD without acute abnormality.   Electronically Signed   By: Alcide CleverMark  Lukens M.D.   On: 11/30/2013 07:58   Dg Chest Portable 1 View  11/29/2013   CLINICAL DATA:  SHORTNESS OF BREATH COUGH WHEEZING HEADACHE  EXAM: PORTABLE CHEST - 1 VIEW  COMPARISON:  Chest radiograph September 21, 2013  FINDINGS: Cardiac silhouette is unremarkable and unchanged. Mildly calcified aortic knob. Similar slightly increased lung volumes with mild chronic interstitial changes, no pleural effusions or focal consolidations. No pneumothorax.  Biventricular cardiac defibrillator in left chest. Multiple EKG lines overlie the patient and may obscure subtle underlying pathology. Patient is rotated the left. Soft tissue planes and included osseous structures are nonsuspicious.  IMPRESSION: COPD without superimposed acute cardiopulmonary process.   Electronically Signed   By: Awilda Metroourtnay  Bloomer   On: 11/29/2013 05:50    Microbiology: No results found for this or any previous visit (from the past 240 hour(s)).   Labs: Basic Metabolic Panel:  Recent Labs Lab 11/29/13 0539 11/29/13 1021 11/30/13 0509   NA 140  --  137  K 3.9  --  4.2  CL 98  --  95*  CO2 27  --  29  GLUCOSE 131*  --  160*  BUN 13  --  23  CREATININE 1.12* 1.02 1.08  CALCIUM 9.7  --  9.4   Liver Function Tests: No results found for this basename: AST, ALT, ALKPHOS, BILITOT, PROT, ALBUMIN,  in the last 168 hours No results found for this basename: LIPASE, AMYLASE,  in the last 168 hours No results found for this basename: AMMONIA,  in the last 168 hours CBC:  Recent Labs Lab 11/29/13 0539 11/29/13 1021 11/30/13 0509  WBC 10.1 8.3 13.1*  NEUTROABS 6.9  --   --   HGB 10.1* 9.5* 9.3*  HCT 32.9* 31.1* 29.6*  MCV 84.8 84.3 82.5  PLT 331 296 295   Cardiac Enzymes:  Recent Labs Lab 11/29/13 0738 11/29/13 1310 11/29/13 1918  TROPONINI <0.30 <0.30 <0.30   BNP: BNP (last 3 results)  Recent Labs  05/24/13 1323 11/29/13 0738  PROBNP 90.0 3956.0*   CBG: No results found for this basename: GLUCAP,  in the last 168 hours     Signed:  Marinda Elk  [facility] 12/02/2013, 12:30 PM

## 2013-12-03 ENCOUNTER — Ambulatory Visit: Payer: Medicare Other | Admitting: Neurology

## 2013-12-10 ENCOUNTER — Ambulatory Visit (INDEPENDENT_AMBULATORY_CARE_PROVIDER_SITE_OTHER): Payer: Medicare Other | Admitting: Nurse Practitioner

## 2013-12-10 ENCOUNTER — Encounter: Payer: Self-pay | Admitting: Nurse Practitioner

## 2013-12-10 VITALS — BP 115/68 | HR 80 | Wt 125.0 lb

## 2013-12-10 DIAGNOSIS — R0989 Other specified symptoms and signs involving the circulatory and respiratory systems: Secondary | ICD-10-CM

## 2013-12-10 DIAGNOSIS — I5022 Chronic systolic (congestive) heart failure: Secondary | ICD-10-CM

## 2013-12-10 DIAGNOSIS — I428 Other cardiomyopathies: Secondary | ICD-10-CM

## 2013-12-10 DIAGNOSIS — R0600 Dyspnea, unspecified: Secondary | ICD-10-CM

## 2013-12-10 DIAGNOSIS — J449 Chronic obstructive pulmonary disease, unspecified: Secondary | ICD-10-CM

## 2013-12-10 DIAGNOSIS — R0609 Other forms of dyspnea: Secondary | ICD-10-CM

## 2013-12-10 LAB — BASIC METABOLIC PANEL
BUN: 22 mg/dL (ref 6–23)
CO2: 24 mEq/L (ref 19–32)
Calcium: 9.1 mg/dL (ref 8.4–10.5)
Chloride: 101 mEq/L (ref 96–112)
Creatinine, Ser: 1.2 mg/dL (ref 0.4–1.2)
GFR: 44.4 mL/min — ABNORMAL LOW (ref >60.00)
Glucose, Bld: 144 mg/dL — ABNORMAL HIGH (ref 70–99)
Potassium: 4.8 mEq/L (ref 3.5–5.1)
Sodium: 140 mEq/L (ref 135–145)

## 2013-12-10 LAB — BRAIN NATRIURETIC PEPTIDE: Pro B Natriuretic peptide (BNP): 91 pg/mL (ref 0.0–100.0)

## 2013-12-10 MED ORDER — FUROSEMIDE 20 MG PO TABS: 20.0000 mg | ORAL_TABLET | Freq: Every day | ORAL | Status: DC

## 2013-12-10 NOTE — Patient Instructions (Addendum)
Stay on your current medicines but I am stopping the Diltiazem  Start lasix 20 mg each morning - this is at the drug store  We will check labs today  See me on a day that Dr. Graciela Husbands is here in 2 to 3 weeks (afternoon if possible)  Call the [facility] office at [phone] if you have any questions, problems or concerns.

## 2013-12-10 NOTE — Progress Notes (Addendum)
[name] Date of Birth: 06/13/1930 Medical Record #[id number]  History of Present Illness: [name] is seen back today for a post hospital visit. Seen for [name]. She is an 80 year old female with HTN, pulmonary HTN, chronic systolic HF, underlying CRT-P, COPD, AF, anemia of chronic disease & B12 deficiency, type 2 DM, CKD and advanced age.   Last seen here in January - noted to be in failing health. EF declining. Looks like she has had reprogramming of her device with her AV delay.   Most recently admitted earlier this month - had more shortness of breath - consistent with COPD exacerbation. On chronic oxygen therapy. Treated with IV steroids, abx and inhalers. Seemed to be euvolemic from cardiac standpoint. Noted by the discharge summary/hospitalist that she was on both Diltiazem and Norvasc ([name] had stopped the Cardiazem at her 1/15 visit and started Coreg).   Comes in today. Here alone. In a wheelchair. Has oxygen in place. Says she continues to do poorly. Breathing has improved some since her admission but she remains fatigued. Weight up a couple of pounds. No swelling. Some cough. Still taking diltiazem along with Norvasc and looks to be on Bystolic now. No chest pain. Remains on her oxygen.   Current Outpatient Prescriptions  Medication Sig Dispense Refill  . albuterol (PROVENTIL) (2.5 MG/3ML) 0.083% nebulizer solution Inhale 3 mLs into the lungs every 4 (four) hours as needed for wheezing or shortness of breath.  240 mL  11  . amLODipine (NORVASC) 5 MG tablet Take 1 tablet (5 mg total) by mouth daily.  30 tablet  11  . clonazePAM (KLONOPIN) 0.5 MG tablet Take 0.5 tablets (0.25 mg total) by mouth at bedtime as needed (sleep).  90 tablet  0  . FREESTYLE LITE test strip CHECK GLUCOSE ONCE A DAY FOR DIABETES  100 each  PRN  . lidocaine (LIDODERM) 5 % Place 1 patch onto the skin daily. Remove & Discard patch within 12 hours or as directed by MD  30 patch  0  . losartan  (COZAAR) 25 MG tablet Take 1 tablet (25 mg total) by mouth daily.  30 tablet  0  . metoCLOPramide (REGLAN) 5 MG tablet Take 5 mg by mouth 3 (three) times daily before meals.      . Multiple Vitamin (MULTIVITAMIN WITH MINERALS) TABS tablet Take 1 tablet by mouth daily.      . nebivolol (BYSTOLIC) 10 MG tablet Take 1 tablet (10 mg total) by mouth daily.      [name]. omeprazole (PRILOSEC) 20 MG capsule Take 20 mg by mouth 2 (two) times daily.      [name]. Respiratory Therapy Supplies (FLUTTER) DEVI Use as directed  1 each  0  . rOPINIRole (REQUIP) 0.25 MG tablet Take 1 tablet (0.25 mg total) by mouth daily.  30 tablet  3  . tiotropium (SPIRIVA) 18 MCG inhalation capsule Place 18 mcg into inhaler and inhale daily.      . traMADol (ULTRAM) 50 MG tablet Take 1 tablet (50 mg total) by mouth every 6 (six) hours as needed for moderate pain.  100 tablet  3  . furosemide (LASIX) 20 MG tablet Take 1 tablet (20 mg total) by mouth daily.  90 tablet  3   No current facility-administered medications for this visit.    Allergies  Allergen Reactions  . Apple Shortness Of Breath, Nausea Only and Other (See Comments)    Per patient tongue swelling. (apples/applesauce)  . Ace  Inhibitors Nausea And Vomiting  . Acetaminophen-Codeine Nausea And Vomiting  . Aspirin     Stomach upset  . Atorvastatin Nausea And Vomiting    lipitor  . Bisoprolol Fumarate Nausea And Vomiting    zebeta  . Ezetimibe-Simvastatin Nausea And Vomiting    vytorin  . Fluvastatin Sodium Nausea And Vomiting    lescol  . Gabapentin Nausea And Vomiting    neurontin  . Gemfibrozil Nausea And Vomiting    Lopid   . Ibuprofen     Motrin- advil= stomach upset  . Lisinopril Nausea And Vomiting    Prinivil, zestril  . Oxycodone Hcl Nausea And Vomiting  . Rosuvastatin Nausea And Vomiting    crestor  . Strawberry   . Tylenol [Acetaminophen]     Stomach upset    Past Medical History  Diagnosis Date  . CONGESTIVE HEART FAILURE 06/03/2007  . COPD  06/03/2007  . HYPERTENSION 06/03/2007  . HYPOTHYROIDISM 06/03/2007  . Hernia   . Asthma   . Diabetes mellitus   . Hyperlipidemia   . Arthritis   . Osteoporosis   . Cardiomyopathy, nonischemic     Irritable ejection fractions most recently EF 60% 3/14>> EF 30% 10/14  . Biventricular pacemaker -Medtronic]     Downgrade from CRT-D; LV and RV rate sense portions switched with subsequent fracture of morning rate sense portion in the LV port  . 6949-lead     Rate sense no LV port with subsequent fracture; high-voltage inactivated following down rate    Past Surgical History  Procedure Laterality Date  . Cardiac defibrillator placement    . Appendectomy    . Hysterectomy - unknown type    . Posterior fusion clivus-c2 transarticular stealth guided w/ icbg    . Cystectomy      elbow  . Icd      medtronic maximo  . Polypectomy      bladder  . Esophagogastroduodenoscopy N/A 10/30/2012    Procedure: ESOPHAGOGASTRODUODENOSCOPY (EGD);  Surgeon: Vertell Novak., MD;  Location: [facility];  Service: Endoscopy;  Laterality: N/A;    History  Smoking status  . Former Smoker -- 2.00 packs/day for 30 years  . Quit date: 08/19/1996  Smokeless tobacco  . Never Used    History  Alcohol Use No    Family History  Problem Relation Age of Onset  . Heart disease Mother   . Breast cancer Mother   . Brain cancer Mother   . Cancer Mother   . Diabetes Mother   . Heart attack Mother     Review of Systems: The review of systems is per the HPI.  All other systems were reviewed and are negative.  Physical Exam: BP 115/68  Pulse 80  Wt 125 lb (56.7 kg)  SpO2 97% Patient is very pleasant and appears chronically ill. Short of breath with activity. Skin is warm and dry. Color is normal.  HEENT is unremarkable. Normocephalic/atraumatic. PERRL. Sclera are nonicteric. Neck is supple. No masses. No JVD. Lungs are coarse with diffuse rhonchi. Cardiac exam shows a regular rate and rhythm. Heart  tones distant. Pacer in the left upper chest. Abdomen is soft. Extremities are without edema. Gait and ROM are intact. No gross neurologic deficits noted.  Wt Readings from Last 3 Encounters:  12/10/13 125 lb (56.7 kg)  12/02/13 122 lb 6.4 oz (55.52 kg)  11/25/13 125 lb 6.4 oz (56.881 kg)     LABORATORY DATA: PENDING  Lab Results  Component Value Date  WBC 13.1* 11/30/2013   HGB 9.3* 11/30/2013   HCT 29.6* 11/30/2013   PLT 295 11/30/2013   GLUCOSE 160* 11/30/2013   CHOL 207* 08/16/2013   TRIG 146 08/16/2013   HDL 51 08/16/2013   LDLCALC 127* 08/16/2013   ALT 9 10/28/2013   AST 10 10/28/2013   NA 137 11/30/2013   K 4.2 11/30/2013   CL 95* 11/30/2013   CREATININE 1.08 11/30/2013   BUN 23 11/30/2013   CO2 29 11/30/2013   TSH 1.040 10/28/2013   INR 1.09 09/21/2013   HGBA1C 6.3* 10/28/2013   Echo Study Conclusions from October 2014  - Left ventricle: The cavity size was normal. Wall thickness was normal. Systolic function was severely reduced. The estimated ejection fraction was in the range of 25% to 30%. Diffuse hypokinesis. Septal-lateral dyssynchrony. Doppler parameters are consistent with abnormal left ventricular relaxation (grade 1 diastolic dysfunction). - Aortic valve: Poorly visualized. There was no stenosis. - Mitral valve: No significant regurgitation. - Right ventricle: The cavity size was normal. Pacer wire or catheter noted in right ventricle. Systolic function was normal. - Tricuspid valve: Peak RV-RA gradient: 34mm Hg (S). - Pulmonary arteries: PA peak pressure: 39mm Hg (S). - Inferior vena cava: The vessel was normal in size; the respirophasic diameter changes were in the normal range (= 50%); findings are consistent with normal central venous pressure.   Assessment / Plan:  1. Recent COPD exacerbation - hard to say if she is back at her baseline. Oxygen sat here today is 97%.   2. Chronic systolic HF - weight up a couple of pounds - will add low dose Lasix.  Stopping Diltiazem today. Check labs. Try to tweak her medicines gradually. See her back in about 3 weeks with Dr. Graciela Husbands present.  3. Underlying CRT-P - not clear to me if she is actually getting BiV therapy or if this is even still an option - will need to discuss with Dr. Graciela Husbands.   4. Pulmonary HTN - on oxygen.   5. Anemia - chronic  Patient is agreeable to this plan and will call if any problems develop in the interim.   Rosalio Macadamia, RN, ANP-C [facility] [address] [place], [place]  [address] [phone]   Addendum: Discussed with Dr. Graciela Husbands today (12/17/13) - the patient's RV lead is really off - the LV lead is in the RV port - she IS getting LV pacing. May not be optimal - he has suggested AV optimization of her device with echo.   Patient was called with this recommendation - she continues to do poorly. She is now under Hospice services since this week - but wants "everything possible if it will help".

## 2013-12-13 ENCOUNTER — Telehealth: Payer: Self-pay | Admitting: *Deleted

## 2013-12-13 NOTE — Telephone Encounter (Signed)
Pt aware of results 

## 2013-12-13 NOTE — Telephone Encounter (Signed)
Message copied by Kem Parkinson on Mon Dec 13, 2013  8:15 AM ------      Message from: Rosalio Macadamia      Created: Mon Dec 13, 2013  7:49 AM       Will you let her know that her labs looked fine. Her fluid level test is actually normal. This would indicate that her shortness of breath is coming more from her lungs and not her heart.            lori ------

## 2013-12-15 ENCOUNTER — Ambulatory Visit (INDEPENDENT_AMBULATORY_CARE_PROVIDER_SITE_OTHER): Payer: Medicare Other | Admitting: Internal Medicine

## 2013-12-15 ENCOUNTER — Encounter: Payer: Self-pay | Admitting: Internal Medicine

## 2013-12-15 VITALS — BP 112/72 | HR 88 | Temp 97.7°F | Resp 40

## 2013-12-15 DIAGNOSIS — I2789 Other specified pulmonary heart diseases: Secondary | ICD-10-CM

## 2013-12-15 DIAGNOSIS — J449 Chronic obstructive pulmonary disease, unspecified: Secondary | ICD-10-CM

## 2013-12-15 DIAGNOSIS — I10 Essential (primary) hypertension: Secondary | ICD-10-CM

## 2013-12-15 DIAGNOSIS — E1129 Type 2 diabetes mellitus with other diabetic kidney complication: Secondary | ICD-10-CM

## 2013-12-15 MED ORDER — PREDNISONE 10 MG PO TABS: 10.0000 mg | ORAL_TABLET | Freq: Every day | ORAL | Status: AC

## 2013-12-15 MED ORDER — LIDOCAINE 5 % EX PTCH: 1.0000 | MEDICATED_PATCH | CUTANEOUS | Status: AC

## 2013-12-15 MED ORDER — ALPRAZOLAM 0.25 MG PO TABS: ORAL_TABLET | ORAL | Status: AC

## 2013-12-15 NOTE — Patient Instructions (Signed)
Chronic Obstructive Pulmonary Disease  Chronic obstructive pulmonary disease (COPD) is a common lung condition in which airflow from the lungs is limited. COPD is a general term that can be used to describe many different lung problems that limit airflow, including both chronic bronchitis and emphysema.  If you have COPD, your lung function will probably never return to normal, but there are measures you can take to improve lung function and make yourself feel better.   CAUSES   · Smoking (common).    · Exposure to secondhand smoke.    · Genetic problems.  · Chronic inflammatory lung diseases or recurrent infections.  SYMPTOMS   · Shortness of breath, especially with physical activity.    · Deep, persistent (chronic) cough with a large amount of thick mucus.    · Wheezing.    · Rapid breaths (tachypnea).    · Gray or bluish discoloration (cyanosis) of the skin, especially in fingers, toes, or lips.    · Fatigue.    · Weight loss.    · Frequent infections or episodes when breathing symptoms become much worse (exacerbations).    · Chest tightness.  DIAGNOSIS   Your healthcare provider will take a medical history and perform a physical examination to make the initial diagnosis.  Additional tests for COPD may include:   · Lung (pulmonary) function tests.  · Chest X-ray.  · CT scan.  · Blood tests.  TREATMENT   Treatment available to help you feel better when you have COPD include:   · Inhaler and nebulizer medicines. These help manage the symptoms of COPD and make your breathing more comfortable  · Supplemental oxygen. Supplemental oxygen is only helpful if you have a low oxygen level in your blood.    · Exercise and physical activity. These are beneficial for nearly all people with COPD. Some people may also benefit from a pulmonary rehabilitation program.  HOME CARE INSTRUCTIONS   · Take all medicines (inhaled or pills) as directed by your health care provider.  · Only take over-the-counter or prescription medicines  for pain, fever, or discomfort as directed by your health care provider.    · Avoid over-the-counter medicines or cough syrups that dry up your airway (such as antihistamines) and slow down the elimination of secretions unless instructed otherwise by your healthcare provider.    · If you are a smoker, the most important thing that you can do is stop smoking. Continuing to smoke will cause further lung damage and breathing trouble. Ask your health care provider for help with quitting smoking. He or she can direct you to community resources or hospitals that provide support.  · Avoid exposure to irritants such as smoke, chemicals, and fumes that aggravate your breathing.  · Use oxygen therapy and pulmonary rehabilitation if directed by your health care provider. If you require home oxygen therapy, ask your healthcare provider whether you should purchase a pulse oximeter to measure your oxygen level at home.    · Avoid contact with individuals who have a contagious illness.  · Avoid extreme temperature and humidity changes.  · Eat healthy foods. Eating smaller, more frequent meals and resting before meals may help you maintain your strength.  · Stay active, but balance activity with periods of rest. Exercise and physical activity will help you maintain your ability to do things you want to do.  · Preventing infection and hospitalization is very important when you have COPD. Make sure to receive all the vaccines your health care provider recommends, especially the pneumococcal and influenza vaccines. Ask your healthcare provider whether you   need a pneumonia vaccine.  · Learn and use relaxation techniques to manage stress.  · Learn and use controlled breathing techniques as directed by your health care provider. Controlled breathing techniques include:    · Pursed lip breathing. Start by breathing in (inhaling) through your nose for 1 second. Then, purse your lips as if you were going to whistle and breathe out (exhale)  through the pursed lips for 2 seconds.    · Diaphragmatic breathing. Start by putting one hand on your abdomen just above your waist. Inhale slowly through your nose. The hand on your abdomen should move out. Then purse your lips and exhale slowly. You should be able to feel the hand on your abdomen moving in as you exhale.    · Learn and use controlled coughing to clear mucus from your lungs. Controlled coughing is a series of short, progressive coughs. The steps of controlled coughing are:    1. Lean your head slightly forward.    2. Breathe in deeply using diaphragmatic breathing.    3. Try to hold your breath for 3 seconds.    4. Keep your mouth slightly open while coughing twice.    5. Spit any mucus out into a tissue.    6. Rest and repeat the steps once or twice as needed.  SEEK MEDICAL CARE IF:   · You are coughing up more mucus than usual.    · There is a change in the color or thickness of your mucus.    · Your breathing is more labored than usual.    · Your breathing is faster than usual.    SEEK IMMEDIATE MEDICAL CARE IF:   · You have shortness of breath while you are resting.    · You have shortness of breath that prevents you from:  · Being able to talk.    · Performing your usual physical activities.    · You have chest pain lasting longer than 5 minutes.    · Your skin color is more cyanotic than usual.  · You measure low oxygen saturations for longer than 5 minutes with a pulse oximeter.  MAKE SURE YOU:   · Understand these instructions.  · Will watch your condition.  · Will get help right away if you are not doing well or get worse.  Document Released: 05/15/2005 Document Revised: 05/26/2013 Document Reviewed: 04/01/2013  [employer]® Patient Information ©2014 [employer].

## 2013-12-15 NOTE — Progress Notes (Signed)
Subjective:    Patient ID: [name], female    DOB: 06/11/1930, 80 y.o.   MRN: [id number]  HPI This very nice 80 yo MWF with Multiple medical problems including End Stage COPD, Pulmonary Hypertension, deteriorating cardiomyopathy, presents to day in a wheel chair in mild respiratory distress. Patient was just hospitalized Apr 13-16 2015 with a multilobar CAP and acute on chronic respiratory failure. Her condition has deteriorated to the point that she is unable to ambulate w/o severe dyspnea. She reports sporadic cough occasionally productive of  a clear to yellowish sputum. She also describes air hungar "like I'm smothering" despite her oxygen. Other current disturbing issues for her are Restless Legs.  Current Outpatient Prescriptions on File Prior to Visit  Medication Sig Dispense Refill  . albuterol (PROVENTIL) (2.5 MG/3ML) 0.083% nebulizer solution Inhale 3 mLs into the lungs every 4 (four) hours as needed for wheezing or shortness of breath.  240 mL  11  . amLODipine (NORVASC) 5 MG tablet Take 1 tablet (5 mg total) by mouth daily.  30 tablet  11  . FREESTYLE LITE test strip CHECK GLUCOSE ONCE A DAY FOR DIABETES  100 each  PRN  . furosemide (LASIX) 20 MG tablet Take 1 tablet (20 mg total) by mouth daily.  90 tablet  3  . losartan (COZAAR) 25 MG tablet Take 1 tablet (25 mg total) by mouth daily.  30 tablet  0  . metoCLOPramide (REGLAN) 5 MG tablet Take 5 mg by mouth 3 (three) times daily before meals.      . Multiple Vitamin (MULTIVITAMIN WITH MINERALS) TABS tablet Take 1 tablet by mouth daily.      . nebivolol (BYSTOLIC) 10 MG tablet Take 1 tablet (10 mg total) by mouth daily.      [name]. omeprazole (PRILOSEC) 20 MG capsule Take 20 mg by mouth 2 (two) times daily.      [name]. Respiratory Therapy Supplies (FLUTTER) DEVI Use as directed  1 each  0  . rOPINIRole (REQUIP) 0.25 MG tablet Take 1 tablet (0.25 mg total) by mouth daily.  30 tablet  3  . tiotropium (SPIRIVA) 18 MCG inhalation  capsule Place 18 mcg into inhaler and inhale daily.      . traMADol (ULTRAM) 50 MG tablet Take 1 tablet (50 mg total) by mouth every 6 (six) hours as needed for moderate pain.  100 tablet  3   No current facility-administered medications on file prior to visit.   Allergies  Allergen Reactions  . Apple Shortness Of Breath, Nausea Only and Other (See Comments)    Per patient tongue swelling. (apples/applesauce)  . Ace Inhibitors Nausea And Vomiting  . Acetaminophen-Codeine Nausea And Vomiting  . Aspirin     Stomach upset  . Atorvastatin Nausea And Vomiting    lipitor  . Bisoprolol Fumarate Nausea And Vomiting    zebeta  . Ezetimibe-Simvastatin Nausea And Vomiting    vytorin  . Fluvastatin Sodium Nausea And Vomiting    lescol  . Gabapentin Nausea And Vomiting    neurontin  . Gemfibrozil Nausea And Vomiting    Lopid   . Ibuprofen     Motrin- advil= stomach upset  . Lisinopril Nausea And Vomiting    Prinivil, zestril  . Oxycodone Hcl Nausea And Vomiting  . Rosuvastatin Nausea And Vomiting    crestor  . Strawberry   . Tylenol [Acetaminophen]     Stomach upset    Past Medical History  Diagnosis Date  . CONGESTIVE  HEART FAILURE 06/03/2007  . COPD 06/03/2007  . HYPERTENSION 06/03/2007  . HYPOTHYROIDISM 06/03/2007  . Hernia   . Asthma   . Diabetes mellitus   . Hyperlipidemia   . Arthritis   . Osteoporosis   . Cardiomyopathy, nonischemic     Irritable ejection fractions most recently EF 60% 3/14>> EF 30% 10/14  . Biventricular pacemaker -[employer]]     Downgrade from CRT-D; LV and RV rate sense portions switched with subsequent fracture of morning rate sense portion in the LV port  . 6949-lead     Rate sense no LV port with subsequent fracture; high-voltage inactivated following down rate   Review of Systems  negative to above.  Objective:   Physical Exam  BP 112/72  Pulse 88  Temp(Src) 97.7 F (36.5 C) (Temporal)  Resp 40  HEENT - Eac's patent. TM's Nl.EOM's  full. PERRLA. NasoOroPharynx clear. Neck - supple. Nl Thyroid. No bruits nodes JVD Chest - Kyphotic with mild stridor and audible exp.wheezes.Scattered rales & rhonchi. Cor - Distant HS. RRR w/o sig MGR. PP 1(+) No edema. Abd - No palpable organomegaly, masses or tenderness. BS nl. MS- FROM. w/o deformities. Muscle power tone and bulk Nl. Gait Nl. Neuro - No obvious Cr N abnormalities. Sensory, motor and Cerebellar functions appear Nl w/o focal abnormalities.  Assessment & Plan:   1. COPD, End Stage - Rx Low dose Alprazolam 0.25 mg takig up to tid/qid to help with air hunger -Prednisone 10 mg tid til return OV in 1 week  2. PULMONARY HYPERTENSION  3. HYPERTENSION  4. T2 NIDDM w/ Stage IV CKD - Advised monitor CBG's on Prednisone & call >200mg %.  Discussed End of life issues with patient and her poor prognosis and expected longevity of less then 6 months. Patient is amenable to Hospice   Approx 45 min spent with patient face to face and in reviewing hospital records and discussing EOL issues.

## 2013-12-16 DIAGNOSIS — J9620 Acute and chronic respiratory failure, unspecified whether with hypoxia or hypercapnia: Secondary | ICD-10-CM

## 2013-12-16 DIAGNOSIS — J449 Chronic obstructive pulmonary disease, unspecified: Secondary | ICD-10-CM

## 2013-12-16 DIAGNOSIS — I509 Heart failure, unspecified: Secondary | ICD-10-CM

## 2013-12-16 DIAGNOSIS — R0602 Shortness of breath: Secondary | ICD-10-CM

## 2013-12-17 ENCOUNTER — Other Ambulatory Visit: Payer: Self-pay | Admitting: Nurse Practitioner

## 2013-12-17 ENCOUNTER — Encounter: Payer: Self-pay | Admitting: Internal Medicine

## 2013-12-17 ENCOUNTER — Telehealth: Payer: Self-pay | Admitting: *Deleted

## 2013-12-17 DIAGNOSIS — I519 Heart disease, unspecified: Principal | ICD-10-CM

## 2013-12-17 DIAGNOSIS — I498 Other specified cardiac arrhythmias: Secondary | ICD-10-CM

## 2013-12-17 NOTE — Telephone Encounter (Signed)
S/w pt will come in and get echo with Dr. Graciela Husbands, pt stated started Hospice this week and feels terrible, Lawson Fiscal also s/w pt and pt stated will do anything to feel better. Pt know's scheduling will be calling her to schedule appointment

## 2013-12-17 NOTE — Telephone Encounter (Signed)
Message copied by Debbe Bales on Fri Dec 17, 2013 11:35 AM ------      Message from: Rosalio Macadamia      Created: Fri Dec 17, 2013 11:26 AM       Looks like she does not have a follow up with me and Dr. Graciela Husbands            Needs echo with AV optimization on a day that Dr. Graciela Husbands is here.            Please call her and let her know that Dr. Graciela Husbands wants to try and make her pacemaker work better. Hopefully this will help her feel better.            lori ------

## 2013-12-21 ENCOUNTER — Ambulatory Visit: Payer: Medicare Other | Admitting: Neurology

## 2013-12-22 ENCOUNTER — Ambulatory Visit: Payer: Self-pay | Admitting: Internal Medicine

## 2013-12-30 ENCOUNTER — Telehealth: Payer: Self-pay | Admitting: Internal Medicine

## 2013-12-30 NOTE — Telephone Encounter (Signed)
Toniann Fail with Hospice- [phone] Called to advise Dr Oneta Rack, that the patient has requested a DNR to be in the home, so the Hospice Dr.signed the DNR. Patient was also given vicoden for leg pain and medicine for cough.  She just wanted to keep [place] up to date on patient.  Thank you, Katrina [facility] [phone]

## 2014-01-04 ENCOUNTER — Other Ambulatory Visit: Payer: Self-pay | Admitting: Internal Medicine

## 2014-01-04 ENCOUNTER — Ambulatory Visit (HOSPITAL_COMMUNITY): Attending: Cardiovascular Disease | Admitting: Cardiology

## 2014-01-04 ENCOUNTER — Encounter: Payer: Self-pay | Admitting: Internal Medicine

## 2014-01-04 DIAGNOSIS — J449 Chronic obstructive pulmonary disease, unspecified: Secondary | ICD-10-CM | POA: Insufficient documentation

## 2014-01-04 DIAGNOSIS — I428 Other cardiomyopathies: Secondary | ICD-10-CM

## 2014-01-04 DIAGNOSIS — I10 Essential (primary) hypertension: Secondary | ICD-10-CM | POA: Insufficient documentation

## 2014-01-04 DIAGNOSIS — I509 Heart failure, unspecified: Secondary | ICD-10-CM | POA: Insufficient documentation

## 2014-01-04 DIAGNOSIS — I519 Heart disease, unspecified: Secondary | ICD-10-CM

## 2014-01-04 DIAGNOSIS — I2789 Other specified pulmonary heart diseases: Secondary | ICD-10-CM | POA: Insufficient documentation

## 2014-01-04 DIAGNOSIS — I498 Other specified cardiac arrhythmias: Secondary | ICD-10-CM

## 2014-01-04 DIAGNOSIS — E785 Hyperlipidemia, unspecified: Secondary | ICD-10-CM | POA: Insufficient documentation

## 2014-01-04 DIAGNOSIS — J4489 Other specified chronic obstructive pulmonary disease: Secondary | ICD-10-CM | POA: Insufficient documentation

## 2014-01-04 DIAGNOSIS — E119 Type 2 diabetes mellitus without complications: Secondary | ICD-10-CM | POA: Insufficient documentation

## 2014-01-04 NOTE — Progress Notes (Signed)
AV optimization limited echo performed. 

## 2014-01-06 ENCOUNTER — Other Ambulatory Visit: Payer: Self-pay | Admitting: *Deleted

## 2014-01-06 MED ORDER — LOSARTAN POTASSIUM 25 MG PO TABS: 25.0000 mg | ORAL_TABLET | Freq: Every day | ORAL | Status: DC

## 2014-01-07 ENCOUNTER — Other Ambulatory Visit: Payer: Self-pay | Admitting: Internal Medicine

## 2014-01-07 ENCOUNTER — Other Ambulatory Visit: Payer: Self-pay | Admitting: Emergency Medicine

## 2014-01-07 MED ORDER — LOSARTAN POTASSIUM 25 MG PO TABS: ORAL_TABLET | ORAL | Status: AC

## 2014-01-27 ENCOUNTER — Encounter: Payer: Self-pay | Admitting: Family

## 2014-01-28 ENCOUNTER — Ambulatory Visit: Payer: Medicare Other | Admitting: Family

## 2014-01-28 ENCOUNTER — Encounter (HOSPITAL_COMMUNITY): Payer: Medicare Other

## 2014-01-28 LAB — MDC_IDC_ENUM_SESS_TYPE_INCLINIC
Battery Remaining Longevity: 37 mo
Brady Statistic AS VS Percent: 0.44 %
Brady Statistic RV Percent Paced: 99.54 %
Lead Channel Impedance Value: 4047 Ohm
Lead Channel Impedance Value: 418 Ohm
Lead Channel Impedance Value: 513 Ohm
Lead Channel Impedance Value: 627 Ohm
Lead Channel Pacing Threshold Pulse Width: 0.4 ms
Lead Channel Pacing Threshold Pulse Width: 0.4 ms
Lead Channel Pacing Threshold Pulse Width: 0.4 ms
Lead Channel Sensing Intrinsic Amplitude: 7.25 mV
Lead Channel Sensing Intrinsic Amplitude: 9 mV
Lead Channel Setting Pacing Amplitude: 3.75 V
Lead Channel Setting Pacing Pulse Width: 0.4 ms
Lead Channel Setting Sensing Sensitivity: 0.9 mV
MDC IDC MSMT BATTERY VOLTAGE: 2.97 V
MDC IDC MSMT LEADCHNL LV IMPEDANCE VALUE: 4047 Ohm
MDC IDC MSMT LEADCHNL LV IMPEDANCE VALUE: 4047 Ohm
MDC IDC MSMT LEADCHNL LV IMPEDANCE VALUE: 760 Ohm
MDC IDC MSMT LEADCHNL LV PACING THRESHOLD AMPLITUDE: 5.5 V
MDC IDC MSMT LEADCHNL RA PACING THRESHOLD AMPLITUDE: 0.625 V
MDC IDC MSMT LEADCHNL RA SENSING INTR AMPL: 1 mV
MDC IDC MSMT LEADCHNL RA SENSING INTR AMPL: 6 mV
MDC IDC MSMT LEADCHNL RV IMPEDANCE VALUE: 570 Ohm
MDC IDC MSMT LEADCHNL RV IMPEDANCE VALUE: 665 Ohm
MDC IDC MSMT LEADCHNL RV PACING THRESHOLD AMPLITUDE: 1.875 V
MDC IDC SESS DTM: 20150519181552
MDC IDC SET LEADCHNL RA PACING AMPLITUDE: 2 V
MDC IDC SET ZONE DETECTION INTERVAL: 400 ms
MDC IDC SET ZONE DETECTION INTERVAL: 400 ms
MDC IDC STAT BRADY AP VP PERCENT: 0.17 %
MDC IDC STAT BRADY AP VS PERCENT: 0.01 %
MDC IDC STAT BRADY AS VP PERCENT: 99.38 %
MDC IDC STAT BRADY RA PERCENT PACED: 0.18 %

## 2014-02-14 ENCOUNTER — Other Ambulatory Visit: Payer: Self-pay | Admitting: Internal Medicine

## 2014-02-28 ENCOUNTER — Other Ambulatory Visit: Payer: Self-pay | Admitting: Neurology

## 2014-02-28 NOTE — Telephone Encounter (Signed)
Ok to refill 

## 2014-02-28 NOTE — Telephone Encounter (Signed)
Ok to refill x 1, please indicate that she has to have a follow-up so we can see how she is doing on medication. Thanks

## 2014-03-03 ENCOUNTER — Other Ambulatory Visit: Payer: Self-pay | Admitting: Neurology

## 2014-03-03 NOTE — Telephone Encounter (Signed)
Hospice calling regarding refill, pt will run out over the week-end. Please call prior to the week-end.   Generic for Requip.  CB# [phone]/ Hospice nurse

## 2014-03-04 NOTE — Telephone Encounter (Signed)
Ok to refill, thanks

## 2014-03-04 NOTE — Telephone Encounter (Signed)
Ok to refill 

## 2014-03-04 NOTE — Telephone Encounter (Signed)
Erica, can you pls refill, thanks

## 2014-03-10 ENCOUNTER — Telehealth: Payer: Self-pay | Admitting: Anesthesiology

## 2014-03-10 NOTE — Telephone Encounter (Signed)
New message     Pt want to stop furosemide.  She is getting too weak and tired to keep running to the bathroom.  She has no edema.  Please advise

## 2014-03-10 NOTE — Addendum Note (Signed)
Addended by: Baird Lyons on: 03/10/2014 06:32 PM   Modules accepted: Orders, Medications

## 2014-03-10 NOTE — Telephone Encounter (Signed)
Advised ok to stop if patient request. Toniann Fail of hospice verbalized understanding.

## 2014-03-15 ENCOUNTER — Ambulatory Visit: Payer: Self-pay | Admitting: Internal Medicine

## 2014-04-07 ENCOUNTER — Ambulatory Visit (INDEPENDENT_AMBULATORY_CARE_PROVIDER_SITE_OTHER): Admitting: *Deleted

## 2014-04-07 ENCOUNTER — Telehealth: Payer: Self-pay | Admitting: Cardiology

## 2014-04-07 DIAGNOSIS — I509 Heart failure, unspecified: Secondary | ICD-10-CM

## 2014-04-07 DIAGNOSIS — I428 Other cardiomyopathies: Secondary | ICD-10-CM

## 2014-04-07 NOTE — Progress Notes (Signed)
Remote pacemaker transmission.   

## 2014-04-07 NOTE — Telephone Encounter (Signed)
Spoke with pt and reminded pt of remote transmission that is due today. Pt verbalized understanding.   

## 2014-04-13 LAB — MDC_IDC_ENUM_SESS_TYPE_REMOTE
Battery Voltage: 2.94 V
Brady Statistic AP VP Percent: 0.25 %
Brady Statistic AP VS Percent: 0 %
Brady Statistic AS VP Percent: 99.7 %
Brady Statistic AS VS Percent: 0.04 %
Brady Statistic RA Percent Paced: 0.25 %
Date Time Interrogation Session: 20150820162921
Lead Channel Impedance Value: 4047 Ohm
Lead Channel Impedance Value: 456 Ohm
Lead Channel Impedance Value: 513 Ohm
Lead Channel Impedance Value: 532 Ohm
Lead Channel Impedance Value: 646 Ohm
Lead Channel Pacing Threshold Amplitude: 0.625 V
Lead Channel Pacing Threshold Pulse Width: 0.4 ms
Lead Channel Pacing Threshold Pulse Width: 0.4 ms
Lead Channel Sensing Intrinsic Amplitude: 3.25 mV
Lead Channel Setting Sensing Sensitivity: 0.9 mV
MDC IDC MSMT BATTERY REMAINING LONGEVITY: 17 mo
MDC IDC MSMT LEADCHNL LV IMPEDANCE VALUE: 4047 Ohm
MDC IDC MSMT LEADCHNL LV IMPEDANCE VALUE: 4047 Ohm
MDC IDC MSMT LEADCHNL LV PACING THRESHOLD AMPLITUDE: 5.5 V
MDC IDC MSMT LEADCHNL RA IMPEDANCE VALUE: 380 Ohm
MDC IDC MSMT LEADCHNL RA IMPEDANCE VALUE: 513 Ohm
MDC IDC MSMT LEADCHNL RV PACING THRESHOLD AMPLITUDE: 2.125 V
MDC IDC MSMT LEADCHNL RV PACING THRESHOLD PULSEWIDTH: 0.4 ms
MDC IDC MSMT LEADCHNL RV SENSING INTR AMPL: 7 mV
MDC IDC SET LEADCHNL RA PACING AMPLITUDE: 2 V
MDC IDC SET LEADCHNL RV PACING AMPLITUDE: 5 V
MDC IDC SET LEADCHNL RV PACING PULSEWIDTH: 1 ms
MDC IDC SET ZONE DETECTION INTERVAL: 400 ms
MDC IDC STAT BRADY RV PERCENT PACED: 99.95 %
Zone Setting Detection Interval: 400 ms

## 2014-04-28 ENCOUNTER — Encounter: Payer: Self-pay | Admitting: Cardiology

## 2014-05-02 ENCOUNTER — Encounter: Payer: Self-pay | Admitting: Internal Medicine

## 2014-05-06 ENCOUNTER — Other Ambulatory Visit: Payer: Self-pay | Admitting: Internal Medicine

## 2014-05-23 ENCOUNTER — Encounter: Payer: Self-pay | Admitting: Internal Medicine

## 2014-06-02 ENCOUNTER — Other Ambulatory Visit: Payer: Self-pay | Admitting: *Deleted

## 2014-06-02 ENCOUNTER — Other Ambulatory Visit: Payer: Self-pay | Admitting: Internal Medicine

## 2014-06-02 MED ORDER — CARVEDILOL 6.25 MG PO TABS: 6.2500 mg | ORAL_TABLET | Freq: Two times a day (BID) | ORAL | Status: AC

## 2014-07-11 ENCOUNTER — Telehealth: Payer: Self-pay | Admitting: Cardiology

## 2014-07-11 ENCOUNTER — Encounter: Admitting: *Deleted

## 2014-07-11 NOTE — Telephone Encounter (Signed)
Confirmed remote transmission w/ pt husband. Pt to send when she returns home from hospital.

## 2014-07-12 ENCOUNTER — Encounter: Payer: Self-pay | Admitting: Cardiology

## 2014-07-19 DEATH — deceased

## 2014-09-22 ENCOUNTER — Encounter: Payer: Self-pay | Admitting: *Deleted

## 2014-10-13 ENCOUNTER — Encounter: Payer: Self-pay | Admitting: *Deleted

## 2014-10-28 ENCOUNTER — Encounter: Payer: Self-pay | Admitting: *Deleted

## 2014-11-17 ENCOUNTER — Encounter: Payer: Self-pay | Admitting: *Deleted

## 2014-12-14 ENCOUNTER — Encounter: Payer: Self-pay | Admitting: *Deleted

## 2015-05-30 ENCOUNTER — Encounter: Payer: Self-pay | Admitting: Internal Medicine
